# Patient Record
Sex: Male | Born: 1940 | Race: White | Hispanic: No | Marital: Married | Smoking: Current every day smoker
Health system: Southern US, Community
[De-identification: ages and names within clinical notes are randomized; demographics above are authoritative.]

## PROBLEM LIST (undated history)

## (undated) DIAGNOSIS — I10 Essential (primary) hypertension: Secondary | ICD-10-CM

## (undated) DIAGNOSIS — K279 Peptic ulcer, site unspecified, unspecified as acute or chronic, without hemorrhage or perforation: Secondary | ICD-10-CM

## (undated) DIAGNOSIS — E785 Hyperlipidemia, unspecified: Secondary | ICD-10-CM

## (undated) DIAGNOSIS — I4901 Ventricular fibrillation: Secondary | ICD-10-CM

## (undated) DIAGNOSIS — I420 Dilated cardiomyopathy: Secondary | ICD-10-CM

## (undated) DIAGNOSIS — I2510 Atherosclerotic heart disease of native coronary artery without angina pectoris: Secondary | ICD-10-CM

## (undated) DIAGNOSIS — I255 Ischemic cardiomyopathy: Secondary | ICD-10-CM

## (undated) DIAGNOSIS — I219 Acute myocardial infarction, unspecified: Secondary | ICD-10-CM

## (undated) DIAGNOSIS — H9190 Unspecified hearing loss, unspecified ear: Secondary | ICD-10-CM

## (undated) DIAGNOSIS — K219 Gastro-esophageal reflux disease without esophagitis: Secondary | ICD-10-CM

## (undated) DIAGNOSIS — K449 Diaphragmatic hernia without obstruction or gangrene: Secondary | ICD-10-CM

## (undated) DIAGNOSIS — Z95810 Presence of automatic (implantable) cardiac defibrillator: Secondary | ICD-10-CM

## (undated) DIAGNOSIS — M1990 Unspecified osteoarthritis, unspecified site: Secondary | ICD-10-CM

## (undated) DIAGNOSIS — I739 Peripheral vascular disease, unspecified: Secondary | ICD-10-CM

## (undated) DIAGNOSIS — I480 Paroxysmal atrial fibrillation: Secondary | ICD-10-CM

## (undated) DIAGNOSIS — H269 Unspecified cataract: Secondary | ICD-10-CM

## (undated) DIAGNOSIS — J449 Chronic obstructive pulmonary disease, unspecified: Secondary | ICD-10-CM

## (undated) HISTORY — PX: CORONARY ANGIOPLASTY: SHX604

## (undated) HISTORY — DX: Acute myocardial infarction, unspecified: I21.9

## (undated) HISTORY — DX: Peripheral vascular disease, unspecified: I73.9

## (undated) HISTORY — PX: APPENDECTOMY: SHX54

## (undated) HISTORY — DX: Hyperlipidemia, unspecified: E78.5

## (undated) HISTORY — PX: PR VEIN BYPASS GRAFT,AORTO-FEM-POP: 35551

## (undated) HISTORY — DX: Peptic ulcer, site unspecified, unspecified as acute or chronic, without hemorrhage or perforation: K27.9

## (undated) HISTORY — DX: Unspecified osteoarthritis, unspecified site: M19.90

## (undated) HISTORY — DX: Chronic obstructive pulmonary disease, unspecified: J44.9

## (undated) HISTORY — DX: Essential (primary) hypertension: I10

## (undated) HISTORY — DX: Atherosclerotic heart disease of native coronary artery without angina pectoris: I25.10

## (undated) HISTORY — DX: Ventricular fibrillation: I49.01

## (undated) HISTORY — DX: Ischemic cardiomyopathy: I25.5

## (undated) HISTORY — DX: Paroxysmal atrial fibrillation: I48.0

## (undated) HISTORY — DX: Gastro-esophageal reflux disease without esophagitis: K21.9

## (undated) HISTORY — DX: Dilated cardiomyopathy: I42.0

## (undated) HISTORY — PX: COLONOSCOPY: SHX174

## (undated) HISTORY — DX: Diaphragmatic hernia without obstruction or gangrene: K44.9

---

## 1993-11-10 DIAGNOSIS — I219 Acute myocardial infarction, unspecified: Secondary | ICD-10-CM

## 1993-11-10 HISTORY — DX: Acute myocardial infarction, unspecified: I21.9

## 1993-11-10 HISTORY — PX: CORONARY ARTERY BYPASS GRAFT: SHX141

## 2004-05-08 ENCOUNTER — Ambulatory Visit (HOSPITAL_COMMUNITY): Admission: RE | Admit: 2004-05-08 | Discharge: 2004-05-08 | Payer: Self-pay | Admitting: Vascular Surgery

## 2004-05-17 ENCOUNTER — Inpatient Hospital Stay (HOSPITAL_COMMUNITY): Admission: RE | Admit: 2004-05-17 | Discharge: 2004-05-18 | Payer: Self-pay | Admitting: Vascular Surgery

## 2009-05-09 ENCOUNTER — Ambulatory Visit: Payer: Self-pay | Admitting: Vascular Surgery

## 2009-05-15 ENCOUNTER — Inpatient Hospital Stay (HOSPITAL_COMMUNITY): Admission: RE | Admit: 2009-05-15 | Discharge: 2009-05-16 | Payer: Self-pay | Admitting: Vascular Surgery

## 2009-05-15 ENCOUNTER — Ambulatory Visit: Payer: Self-pay | Admitting: Vascular Surgery

## 2009-06-01 ENCOUNTER — Ambulatory Visit: Payer: Self-pay | Admitting: Vascular Surgery

## 2009-12-07 ENCOUNTER — Ambulatory Visit: Payer: Self-pay | Admitting: Vascular Surgery

## 2010-06-21 ENCOUNTER — Ambulatory Visit: Payer: Self-pay | Admitting: Vascular Surgery

## 2010-12-02 ENCOUNTER — Encounter: Payer: Self-pay | Admitting: Vascular Surgery

## 2011-02-16 LAB — BASIC METABOLIC PANEL
BUN: 4 mg/dL — ABNORMAL LOW (ref 6–23)
Chloride: 99 mEq/L (ref 96–112)
Creatinine, Ser: 0.9 mg/dL (ref 0.4–1.5)
GFR calc Af Amer: >60 mL/min (ref >60)
Potassium: 2.9 mEq/L — ABNORMAL LOW (ref 3.5–5.1)

## 2011-02-16 LAB — CBC
Hemoglobin: 13.1 g/dL (ref 13.0–17.0)
Platelets: 180 10*3/uL (ref 150–400)
RDW: 13.6 % (ref 11.5–15.5)
WBC: 15.7 10*3/uL — ABNORMAL HIGH (ref 4.0–10.5)

## 2011-02-16 LAB — WOUND CULTURE

## 2011-02-16 LAB — TYPE AND SCREEN: ABO/RH(D): O NEG

## 2011-02-17 LAB — COMPREHENSIVE METABOLIC PANEL
ALT: 16 U/L (ref 0–53)
AST: 20 U/L (ref 0–37)
Albumin: 4 g/dL (ref 3.5–5.2)
Alkaline Phosphatase: 104 U/L (ref 39–117)
CO2: 29 mEq/L (ref 19–32)
GFR calc Af Amer: >60 mL/min (ref >60)
GFR calc non Af Amer: >60 mL/min (ref >60)
Glucose, Bld: 98 mg/dL (ref 70–99)
Sodium: 142 mEq/L (ref 135–145)

## 2011-02-17 LAB — CBC
HCT: 44 % (ref 39.0–52.0)
MCHC: 33.8 g/dL (ref 30.0–36.0)
WBC: 14.5 10*3/uL — ABNORMAL HIGH (ref 4.0–10.5)

## 2011-02-17 LAB — PROTIME-INR
INR: 1 (ref 0.00–1.49)
Prothrombin Time: 13.1 seconds (ref 11.6–15.2)

## 2011-03-25 NOTE — H&P (Signed)
HISTORY AND PHYSICAL EXAMINATION   May 09, 2009   Re:  Stephen Cohen, Stephen Cohen                 DOB:  09-26-1941   ADMISSION DIAGNOSIS:  Skin erosion from a prior right-to-left fem-fem  bypass.   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  He  is known to me from a prior right-to-left fem-fem bypass for left iliac  occlusion in 1995.  He did well and subsequently in 2005 presented with  a right femoral false aneurysm and occluded fem-fem graft.  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  He has done well in the subsequent 5 years.  He just recently  presented to his medical doctor with erythema and erosion near his right  groin over the graft.  I discussed this with his family physician and  they requested we see him for evaluation with a CAT scan to visualize  graft involvement.  He is relatively stable from a cardiac standpoint.  He has had subsequent myocardial infarctions.  He does have hypertension  and elevated cholesterol, COPD, acid reflux disease.   FAMILY HISTORY:  Significant for premature atherosclerotic disease in  his mother and father.   SOCIAL HISTORY:  He is married.  He is retired.  He does smoke 1-1/2  packs of cigarettes per day.  He does not drink alcohol.   REVIEW OF SYSTEMS:  His weight is reported at 175 pounds, he is 6 feet  tall.  He reports chest pain, shortness of breath with lying flat and  exertion, hiatal hernia reflux, pain in his legs with walking with  arthritic joint pain.   CURRENT MEDICATIONS:  1. Cipro 500 mg b.i.d.  2. Norvasc 10 mg 2 per day.  3. Isosorbide 120 mg 1 per day.  4. Lipitor 20 mg daily.  5. Furosemide 40 mg daily.  6. Nexium 40 mg daily.  7. Klor-Con 20 mEq twice daily.  8. Combivent 2 puffs 4 times per day.  9. Nitroglycerin sublingual comp as needed.  10.Probenecid 500  mg 1/2 twice per day.  11.Slo-Niacin 500 mg twice per day.   PHYSICAL EXAMINATION:  A well-developed well-nourished white male  appearing stated age of 32.  Blood pressure is 138/72, pulse 70,  respirations 18.  Chest:  Clear bilaterally.  Heart:  Regular rate and  rhythm.  Abdomen:  Benign with no evidence of tenderness.  He does have  easily palpable fem-fem bypass graft and graft pulses and palpable  posterior tibial pulses bilaterally.  He does have a punctate erosion,  no exposed graft, near the right femoral anastomosis.  I reviewed his  CAT scan with him.  This does show a patent bypass and no evidence of  fluid around the graft.  I explained that with the persistent drainage  from this that he obviously has local involvement of this area of graft.  He is quite thin and this is eroded to the surface.  I explained that  his options would be complete removal of the graft vs. Attempted removal  at a segment of the graft to preserve the remaining portion of the graft  anastomoses.  I have recommended this option.  He does understand that  he may have more involvement at the time of surgery and would require  complete replacement of  graft.  We will proceed with this on 07/06 at  [facility].  He understands that he does have significant  coronary disease but that this certainly is not an elective procedure  since he does have involvement of the graft.   Larina Earthly, M.D.  Electronically Signed   TFE/MEDQ  D:  05/09/2009  T:  05/10/2009  Job:  [id number]   cc:   Gabriel Cirri, Dr.  Hazle Nordmann

## 2011-03-25 NOTE — Assessment & Plan Note (Signed)
OFFICE VISIT   ORVIE, CARADINE  DOB:  09/11/1941                                       06/01/2009  EAVWU#:[id number]   The patient presents today for followup of replacement of his right half  of his right to left fem-fem bypass.  He had had initial graft placement  in 1995 and subsequent replacement of the right anastomosis due to false  aneurysm in 2005.  He presented with erosion of the graft at the skin  level.  The old right half of the graft was completely removed and he  had a rifampin soaked Dacron graft placed tying into the midline and  extending it over to the redo right femoral anastomosis.  He has done  well.  He had a single suture of nylon placed at the area of the eroded  graft and this still has slight serous drainage.  There is no erythema  or fluctuance.   He will continue his usual activities.  Since he lives in [place] he  will notify us should he develop any persistent drainage greater than  another 2 weeks and if so we will see him back in followup.   Larina Earthly, M.D.  Electronically Signed   TFE/MEDQ  D:  06/01/2009  T:  06/02/2009  Job:  [id number]

## 2011-03-25 NOTE — Op Note (Signed)
NAMEITZAE, MIRALLES NO.:  [id number]   MEDICAL RECORD NO.:  [id number]          PATIENT TYPE:  INP   LOCATION:  2039                         FACILITY:  [facility]   PHYSICIAN:  Larina Earthly, M.D.    DATE OF BIRTH:  [birth year]   DATE OF PROCEDURE:  05/15/2009  DATE OF DISCHARGE:                               OPERATIVE REPORT   PREOPERATIVE DIAGNOSIS:  Exposed graft of right to left femoral-femoris  bypass.   POSTOPERATIVE DIAGNOSIS:  Exposed graft of right to left femoral-femoris  bypass.   PROCEDURE:  Replacement of right to left femoral-femoral bypass with 10-  mm rifampicin-soaked Hemashield graft.   SURGEON:  Larina Earthly, MD   ASSISTANT:  Jerold Coombe, PA-C   ANESTHESIA:  General endotracheal.   COMPLICATIONS:  None.   DISPOSITION:  To recovery room, stable.   INDICATIONS FOR PROCEDURE:  The patient is a 70 year old gentleman who  initially underwent right to left fem-fem bypass for left iliac  occlusion initially in 1995.  In 2005, he had replacement of the right  femoral anastomosis due to false aneurysm at this level.  He had done  well since that time.  He presented to our office with several month  history of irritation and drainage from an area overlying the graft.   On physical exam, the graft did come very close to the skin and there  was a punctate pinpoint area with slight drainage.  There was no  surrounding erythema.  The patient had undergone CT angiogram, which did  not show any fluid around the graft and did show that the graft  approached the skin at this level.  It was recommended that he undergo  replacement of this portion of the graft, also explained that he may  require complete removal of the graft if there was gross infection.   PROCEDURE IN DETAIL:  The patient was taken to the room and placed in  supine position where the both groins were prepped and draped in usual  sterile fashion.  An incision was made over the  old femoral anastomosis  through the old scar and carried down to isolate the Hemashield graft to  common femoral anastomosis.  The graft itself was well incorporated and  there was no evidence of pus.  The common, superficial, femoral, and  fundus femoris arteries were occluded.  The graft then was mobilized  towards this area of involvement and there was fluid at this area with  no incorporation, and there was scant amount of fluid and no pus  present.  This was sent for Gram stain, C and S, Gram stain showed no  organisms.  For this reason, a separate incision was made at the midline  over the pulse of the right to left fem-fem graft and the graft at this  area was exposed and again was incorporated.  Incision was made to  replace this segment of the graft from the midline including the right  femoral anastomosis with a rifampin soaked Hemashield graft.  This was a  10-mm Hemashield graft brought onto  the field and was prepped in the  usual fashion with rifampin soak.  The graft was occluded at the midline  and also the common superficial femoral-femoris were occluded.  The  graft was transected at the midline and the graft was completely removed  from the femoral anastomosis.  A tunnel was created around the area of  the old exposed graft, more cephalad over the abdominal wall and the  graft brought to the tunnel.  The graft was sewn end-to-end to the old  graft.  A running 5-0 Prolene suture then was brought to approximation  with the femoral artery.  The graft slightly spatulated, sewn end-to-  side to the femoral artery with a running 5-0 Prolene suture.  Prior to  completion of anastomosis, usual flushing maneuvers were undertaken.  The patient had been given 1000 intravenous heparin for occlusion of the  graft and arteries.  There was technically good anastomosis and the  heparin with 50 mg of protamine.  Wounds were irrigated with saline.  Hemostasis cautery.  The groin and  midline incision were closed with  several layers of 2-0 Vicryl and subcutaneous tissue.  Skin was closed  with skin clips.  The area of the prior involvement of irritation had  been ellipsed.  There was no gross infection.  This was closed with a  single 3-0 nylon stitch.  Sterile dressing was applied.  The patient was  taken to the recovery room in stable condition.      Larina Earthly, M.D.  Electronically Signed     TFE/MEDQ  D:  05/15/2009  T:  05/15/2009  Job:  [id number]

## 2011-03-25 NOTE — Procedures (Signed)
BYPASS GRAFT EVALUATION   INDICATION:  Followup of right to left fem-fem bypass graft.   HISTORY:  Diabetes:  No.  Cardiac:  MI in 1995.  Hypertension:  Yes.  Smoking:  Unknown.  Previous Surgery:  Right to left fem-fem bypass graft July of 2010.   SINGLE LEVEL ARTERIAL EXAM                               RIGHT              LEFT  Brachial:                    135                139  Anterior tibial:             102                128  Posterior tibial:            133                136  Peroneal:  Ankle/brachial index:        0.96               0.98   PREVIOUS ABI:  Date:  05/08/2004  RIGHT:  0.98  LEFT:  0.98   LOWER EXTREMITY BYPASS GRAFT DUPLEX EXAM:   DUPLEX:  Biphasic Doppler waveforms throughout bypass graft and native  arteries.   IMPRESSION:  1. Patent right to left femoral-femoral with elevated velocity of 231      cm/s at the proximal anastomosis.  2. Ankle brachial indices are within normal limits bilaterally.   ___________________________________________  Larina Earthly, M.D.   CJ/MEDQ  D:  12/07/2009  T:  12/07/2009  Job:  [id number]

## 2011-03-25 NOTE — Discharge Summary (Signed)
NAMEALIOU, MEALEY NO.:  [id number]   MEDICAL RECORD NO.:  [id number]          PATIENT TYPE:  INP   LOCATION:  2039                         FACILITY:  [facility]   PHYSICIAN:  Larina Earthly, M.D.    DATE OF BIRTH:  [birth year]   DATE OF ADMISSION:  05/15/2009  DATE OF DISCHARGE:  05/16/2009                               DISCHARGE SUMMARY   Patient of Dr. Arbie Cookey   FINAL/DISCHARGE DIAGNOSES:  1. Skin erosion of right groin of right-to-left femoral-femoral bypass      graft.  2. Peripheral vascular disease.  3. Hypertension.  4. Dyslipidemia.   PROCEDURE PERFORMED:  Revision of right-to-left femoral-femoral bypass  graft with replacement of right left internal mammary artery graft with  10-mm Hemashield Dacron graft by Dr. early on May 15, 2009.   COMPLICATIONS:  None.   CONDITION ON DISCHARGE:  Stable and improving.   DISCHARGE MEDICATIONS:  1. Ocuvite one p.o. q.a.m. and p.m.  2. Norvasc 10 mg p.o. b.i.d.  3. Imdur 20 mg p.o. t.i.d.  4. Lipitor 20 mg p.o. q.a.m.  5. Nexium 40 mg p.o. q.a.m.  6. Lasix 40 mg p.o. q.a.m.  7. Niaspan 500 mg p.o. b.i.d.  8. Combivent inhaler 2 puffs p.r.n.  9. Klor-Con 20 mEq p.o. b.i.d.  10.Probenecid 500 mg 1/2 tablet p.o. b.i.d.  11.Bioflavonoids Plus p.o. b.i.d.   He is given prescriptions for Keflex 500 mg p.o. t.i.d. for 2 weeks and  Percocet 5/325 one p.o. q.4 h. p.r.n. pain total #30 were given.   DISPOSITION:  He is being discharged home in stable condition with his  wounds healing well.  He is given careful instructions regarding care of  his wounds and activity level.  He is to follow up with Dr. early in 2  weeks, the office will arrange the visit.   BRIEF IDENTIFYING STATEMENT:  For complete details, please refer the  typed history and physical.  Briefly, this 70 year old gentleman  underwent right-to-left femoral-femoral bypass grafting earlier.  He has  developed some erosion of the skin over his right  groin.  Dr. Arbie Cookey  recommended exploration.  He was informed of the risks and benefits of  the procedure and after careful consideration, he elected to proceed  with surgery.   HOSPITAL COURSE:  Preoperative workup was completed as an outpatient.  He was brought in through same day surgery and underwent the  aforementioned revision.  For complete details, please refer the typed  operative report.  The procedure was without complications.  He was  returned to the Post Anesthesia Care Unit and extubated.  Following  stabilization, he was transferred to a bed on the surgical convalescent  floor.  He was observed overnight, the following morning, he was found  to be in stable condition.  He was desirous of discharge and he was  subsequently discharged home.      Wilmon Arms, PA      Larina Earthly, M.D.  Electronically Signed    KEL/MEDQ  D:  05/16/2009  T:  05/16/2009  Job:  [id number]

## 2011-03-28 NOTE — Op Note (Signed)
NAME:  Stephen Cohen, Stephen Cohen                           ACCOUNT NO.:  [id number]   MEDICAL RECORD NO.:  [id number]                   PATIENT TYPE:  INP   LOCATION:  3307                                 FACILITY:  [facility]   PHYSICIAN:  Larina Earthly, M.D.                 DATE OF BIRTH:  1941-05-02   DATE OF PROCEDURE:  DATE OF DISCHARGE:  05/18/2004                                 OPERATIVE REPORT   PREOPERATIVE DIAGNOSIS:  Right femoral false aneurysm.   POSTOPERATIVE DIAGNOSIS:  Right femoral false aneurysm with occluded right-  to-left fem-fem bypass.   PROCEDURE:  1. Repair of fight femoral false aneurysm.  2. Thrombectomy of right-to-left fem-fem bypass.   SURGEON:  Larina Earthly, M.D.   ASSISTANT:  Coral Ceo, P.A.C.   ANESTHESIA:  General endotracheal.   COMPLICATIONS:  None; to recovery room stable.   DESCRIPTION OF PROCEDURE:  The patient was taken to the operating room and  placed __________ where the right groin was prepped and draped in the usual  fashion.  Incision was made over the femoral false aneurysm and carried down  to isolate the large false aneurysm and also to isolate the right common  femoral artery, proximal and distal to the old fem-fem bypass anastomosis.   The patient was given 7000 units of intravenous heparin, and after adequate  circulation time the common femoral artery was occluded proximally and  distally.  The false aneurysm was entered and there was no evidence of  purulence.  There was a culture sent for C&S.  The fem-fem bypass had  thrombosed which was unexpected.  The patient had had an arteriogram the  week prior regarding patency.  It was presumed that this occluded with  pressure after the arteriogram.   The fem-fem bypass was thrombectomized with a 5 Fogarty and good  backbleeding was encountered.  This was flushed with heparinized saline and  reoccluded.  An __________ graft of 10 mm of Hemashield was brought out onto  the field.  The  old anastomosis was excised.  The graft was spatulated and  sewn end-to-side to the common femoral artery with a running 5-0 Prolene  suture.  Clamps were removed from the femoral artery and were placed on the  graft.  The graft was cut to appropriate length and was then sewn end-to-end  to the old graft with a running 5-0 Prolene suture.  Clamps were removed and  an excellent flow was noted with pulse being palpable in the dorsalis pedis  and the right and left foot.  The false aneurysm sac was excised.   The wound was irrigated with saline.  Hemostasis with electrocautery.  Wounds were closed with 2-0 Vicryl in several layers in the subcutaneous  tissue and the skin was closed with 3-0 subcuticular Vicryl stitch.  Benzoin  and Steri-Strips were applied.  The patient was taken to the recovery  room  in stable condition.                                               Larina Earthly, M.D.    TFE/MEDQ  D:  05/17/2004  T:  05/18/2004  Job:  [id number]

## 2011-03-28 NOTE — Op Note (Signed)
NAME:  Stephen Cohen, Stephen Cohen                           ACCOUNT NO.:  [id number]   MEDICAL RECORD NO.:  [id number]                   PATIENT TYPE:  OIB   LOCATION:  2899                                 FACILITY:  [facility]   PHYSICIAN:  Larina Earthly, M.D.                 DATE OF BIRTH:  25-Sep-1941   DATE OF PROCEDURE:  05/08/2004  DATE OF DISCHARGE:                                 OPERATIVE REPORT   PREOPERATIVE DIAGNOSIS:  Right femoral false aneurysm.   POSTOPERATIVE DIAGNOSIS:  Right femoral false aneurysm.   PROCEDURE:  Aortogram, bilateral lower extremities run-off.   SURGEON:  Larina Earthly, M.D.   ANESTHESIA:  1% lidocaine local.   COMPLICATIONS:  None.   DISPOSITION:  To recovery room, stable.   INDICATIONS FOR PROCEDURE:  The patient is status post right to left  femorofemoral bypass graft, approximately 10 years prior. He presents with  enlarged, false aneurysm in his right femoral anastomosis. The bypass graft  itself is patent.  The patient also reports claudication in both calves when  walking uphill.   DESCRIPTION OF PROCEDURE:  The patient was taken to the cardiac  catheterization, placed in the supine position where the area of both groins  were prepped and draped in the usual sterile fashion.  Using local  anesthesia for single wall puncture, the right common femoral artery was  entered and the guide wire was passed up to the level of the suprarenal  aorta.   AP projection was undertaken.  This revealed widely patent single renal  arteries bilaterally.  There was irregularity of the infra-renal aorta but  no evidence of flow dynamic stenosis. There was mild irregularity at the  take-off of the right common femoral artery. There was complete occlusion of  the left external iliac artery.  The pigtail catheter was then withdrawn  down into the infrarenal aorta and aortogram and run-off was obtained. This  revealed a patent right to left femorofemoral bypass with a  moderate sized  false aneurysm in the right groin and a small, false aneurysm in the left  groin.  The superficial femoral arteries were widely patent bilaterally with  no evidence of flow limiting stenosis. The popliteal arteries were also  widely patent bilaterally and there was normal 3 vessel run-off bilaterally.   Additional oblique views were obtained of the right groin and again, no  evidence of flow limiting stenosis was noted at the anastomosis.  The  patient tolerated the procedure well without any immediate complications,  was transferred to the holding area in stable condition.   FINDINGS:  1. Patent right to left femorofemoral bypass with false aneurysm of the     right femoral anastomosis.  2. No evidence of significant iliac occlusive disease on the right with     normal pull-back pressures.  3. Normal patent superficial femoral artery, popliteal and 3 vessel run-offs  bilaterally.                                               Larina Earthly, M.D.    TFE/MEDQ  D:  05/08/2004  T:  05/08/2004  Job:  [id number]

## 2011-03-28 NOTE — H&P (Signed)
NAME:  Stephen Cohen                           ACCOUNT NO.:  [id number]   MEDICAL RECORD NO.:  [id number]                   PATIENT TYPE:  INP   LOCATION:  NA                                   FACILITY:  [facility]   PHYSICIAN:  Larina Earthly, M.D.                 DATE OF BIRTH:  [birth year]   DATE OF ADMISSION:  05/17/2004  DATE OF DISCHARGE:                                HISTORY & PHYSICAL   CHIEF COMPLAINT:  Knot in my right groin.   HISTORY OF PRESENT ILLNESS:  This is a 70 year old male, well known to [facility]  and associates, having previous right-to-left femoral-femoral bypass  grafting in 1995 by Dr. Kristen Loader. Early.  He additionally has undergone  interventional procedures through the radiology department including a right  iliac, percutaneous transluminal angioplasty, and a right superficial  femoral artery angioplasty.  Recently, the patient has had increasing  symptoms of bilateral claudication.  He does not have rest pain.  He does  not have any tissue loss.  Approximately four months ago, he noted that he  was developing some increased prominence in the region of the right groin  that has expanded significantly.  He was seen in the office by Dr. Arbie Cookey and  was felt to have findings consistent with a femoral pseudoaneurysm.   An arteriogram with bilateral lower extremities was scheduled and performed  on May 08, 2004, by Dr. Arbie Cookey.  Findings included:  1. Patent right-to-left femoral-femoral bypass with false aneurysm of the     right femoral anastomosis.  2. No evidence of significant iliac occlusive disease on the right with     normal pullback pressures.  3. Normal patent superficial femoral artery popliteal and three-vessel     bilateral runoff.   The patient has had no significant constitutional symptoms and denies fever  or chills. The mass is tender to palpation.  He has had no discharge or  erythema.  He is agreeable to proceed with repair of the right femoral  pseudoaneurysm, and will be admitted this hospitalization for the procedure.   PAST MEDICAL HISTORY:  1. Coronary artery disease status post myocardial infarction.  2. Peripheral vascular occlusive disease.  3. Peptic ulcer disease with hiatal hernia and gastroesophageal reflux.  4. Hypertension.  5. Hyperlipidemia.  6. Gout.   PAST SURGICAL HISTORY:  1. Coronary artery bypass graft x7 in 1995 by Dr. Tyrone Sage.  2. Appendectomy.  3. Right to left fem-fem bypass in 1995 by Dr. Arbie Cookey.   CURRENT MEDICATIONS:  1. Norvasc 10 mg b.i.d.  2. Imdur 120 mg daily.  3. Lipitor 10 mg daily.  4. Nexium 40 mg daily.  5. Lasix 40 mg daily.  6. Allopurinol 100 mg daily.  7. Aspirin 81 mg daily.  8. Slow niacin 500 mg daily.  9. Combivent two puffs b.i.d. p.r.n.  10.      Sublingual nitroglycerin  spray p.r.n.   ALLERGIES:  Novocaine.   REVIEW OF SYMPTOMS:  See history of present illness for significant  positives.  Also positive for the following:  1. Orthopnea.  2. Tinnitus.  3. Easy bruising.  4. Multi joint arthritis.  5. Nocturia.  6. Decreased auditory acuity.   FAMILY HISTORY:  Remarkable for heart disease in his mother.  He also has  multiple family members with diabetes mellitus.   SOCIAL HISTORY:  He is married, alcohol use none.  Tobacco use approximately  110 pack a year history.  He began smoking at age 63.  He continues  approximately two packs per day.   PHYSICAL EXAMINATION:  VITAL SIGNS:  Blood pressure 120/90, pulse 82.  Respirations 16.  GENERAL:  This is a 70 year old white male, in no acute distress.  Alert and  oriented x3.  HEENT:  Examination, normocephalic, atraumatic.  Pupils equal, round and  reactive to light.  Extraocular movements intact.  No obvious cataracts.  Pharynx is clear.  No exudates or erythema.  He does have upper dentures.  He is edentulous in the lower jaw.  No obvious lesions.  NECK:  Examination supple.  No jugular venous distension.  No  carotid  bruits.  No lymphadenopathy.  CHEST:  Examination symmetrical on inspiration.  No wheezes, rhonchi or  rales.  CARDIAC:  Regular rate and rhythm with occasional ectopic, extra systole.  No murmurs, gallops or rubs.  ABDOMEN EXAMINATION:  Soft, nontender, normoactive bowel sounds.  There is a  large mass in the right groin consistent with the aforementioned diagnosis  of pseudoaneurysm.  EXTREMITIES:  No edema.  Peripheral pulses are equal and intact bilaterally.  NEUROLOGIC:  Examination is grossly nonfocal.   ASSESSMENT:  Large right pseudoaneurysm for elective repair, May 17, 2004,  Dr. Arbie Cookey.      Rowe Clack, P.A.-C.                    Larina Earthly, M.D.    Sherryll Burger  D:  05/15/2004  T:  05/15/2004  Job:  [id number]   cc:   Larina Earthly, M.D.  [address]  [place]  [place] [address]   Doreen Beam  [address]  [place]  [place] [address]  Fax: [phone]

## 2012-07-22 ENCOUNTER — Encounter: Payer: Self-pay | Admitting: Vascular Surgery

## 2012-08-16 ENCOUNTER — Other Ambulatory Visit: Payer: Self-pay | Admitting: *Deleted

## 2012-08-16 ENCOUNTER — Encounter: Payer: Self-pay | Admitting: Vascular Surgery

## 2012-08-16 DIAGNOSIS — Z48812 Encounter for surgical aftercare following surgery on the circulatory system: Secondary | ICD-10-CM

## 2012-08-16 DIAGNOSIS — I70219 Atherosclerosis of native arteries of extremities with intermittent claudication, unspecified extremity: Secondary | ICD-10-CM

## 2012-08-19 ENCOUNTER — Encounter: Payer: Self-pay | Admitting: Neurosurgery

## 2012-08-20 ENCOUNTER — Ambulatory Visit (INDEPENDENT_AMBULATORY_CARE_PROVIDER_SITE_OTHER): Payer: Medicare Other | Admitting: Neurosurgery

## 2012-08-20 ENCOUNTER — Ambulatory Visit (INDEPENDENT_AMBULATORY_CARE_PROVIDER_SITE_OTHER): Payer: Medicare Other | Admitting: Vascular Surgery

## 2012-08-20 ENCOUNTER — Encounter: Payer: Self-pay | Admitting: Neurosurgery

## 2012-08-20 VITALS — BP 136/68 | HR 58 | Resp 18 | Ht 71.0 in | Wt 162.0 lb

## 2012-08-20 DIAGNOSIS — R1032 Left lower quadrant pain: Secondary | ICD-10-CM

## 2012-08-20 DIAGNOSIS — I724 Aneurysm of artery of lower extremity: Secondary | ICD-10-CM

## 2012-08-20 DIAGNOSIS — I739 Peripheral vascular disease, unspecified: Secondary | ICD-10-CM

## 2012-08-20 DIAGNOSIS — Z48812 Encounter for surgical aftercare following surgery on the circulatory system: Secondary | ICD-10-CM

## 2012-08-20 DIAGNOSIS — I70219 Atherosclerosis of native arteries of extremities with intermittent claudication, unspecified extremity: Secondary | ICD-10-CM

## 2012-08-20 MED ORDER — CEPHALEXIN 500 MG PO CAPS: 500.0000 mg | ORAL_CAPSULE | Freq: Four times a day (QID) | ORAL | Status: DC

## 2012-08-20 NOTE — Progress Notes (Signed)
Ankle brachial Indices performed @ [facility] 08/20/2012

## 2012-08-20 NOTE — Progress Notes (Signed)
[facility] [place] PAD/PVD Office Note  CC: PVD surveillance Referring Physician: Early  History of Present Illness: 71 year old male patient of Dr. early status post right to left fem-fem bypass graft in July of 2010 with other intervention previously. The patient denies any signs or symptoms of claudication, rest pain or open ulcerations of lower extremities. After I examined the patient and was about to age of around the patient showed me a large mass in the lower left abdomen that trialed distal to the umbilicus. The patient states it gets sore at times, some worse than others, and has been transient since his last procedure. There has been no redness or drainage or fever.  Past Medical History  Diagnosis Date  . Hypertension   . Hyperlipidemia   . Myocardial infarction 1995  . COPD (chronic obstructive pulmonary disease)   . GERD (gastroesophageal reflux disease)   . Hiatal hernia   . Peripheral vascular disease   . Arthritis   . Peptic ulcer disease     ROS: [x]  Positive   [ ]  Denies    General: [ ]  Weight loss, [ ]  Fever, [ ]  chills Neurologic: [ ]  Dizziness, [ ]  Blackouts, [ ]  Seizure [ ]  Stroke, [ ]  "Mini stroke", [ ]  Slurred speech, [ ]  Temporary blindness; [ ]  weakness in arms or legs, [ ]  Hoarseness Cardiac: [ ]  Chest pain/pressure, [ ]  Shortness of breath at rest [ ]  Shortness of breath with exertion, [ ]  Atrial fibrillation or irregular heartbeat Vascular: [ ]  Pain in legs with walking, [ ]  Pain in legs at rest, [ ]  Pain in legs at night,  [ ]  Non-healing ulcer, [ ]  Blood clot in vein/DVT,   Pulmonary: [ ]  Home oxygen, [ ]  Productive cough, [ ]  Coughing up blood, [ ]  Asthma,  [ ]  Wheezing Musculoskeletal:  [ ]  Arthritis, [ ]  Low back pain, [ ]  Joint pain Hematologic: [ ]  Easy Bruising, [ ]  Anemia; [ ]  Hepatitis Gastrointestinal: [ ]  Blood in stool, [ ]  Gastroesophageal Reflux/heartburn, [ ]  Trouble swallowing Urinary: [ ]  chronic Kidney disease,  [ ]  on HD - [ ]  MWF or [ ]  TTHS, [ ]  Burning with urination, [ ]  Difficulty urinating Skin: [ ]  Rashes, [ ]  Wounds Psychological: [ ]  Anxiety, [ ]  Depression   Social History History  Substance Use Topics  . Smoking status: Current Every Day Smoker -- 1.0 packs/day  . Smokeless tobacco: Not on file  . Alcohol Use: No    Family History No family history on file.  No Known Allergies  Current Outpatient Prescriptions  Medication Sig Dispense Refill  . albuterol-ipratropium (COMBIVENT) 18-103 MCG/ACT inhaler Inhale into the lungs every 6 (six) hours as needed.      Marland Kitchen amLODipine (NORVASC) 10 MG tablet Take 10 mg by mouth daily.      Marland Kitchen atorvastatin (LIPITOR) 20 MG tablet Take 20 mg by mouth daily.      Marland Kitchen esomeprazole (NEXIUM) 40 MG capsule Take 40 mg by mouth daily before breakfast.      . furosemide (LASIX) 40 MG tablet Take 40 mg by mouth daily.      . isosorbide mononitrate (IMDUR) 120 MG 24 hr tablet Take 120 mg by mouth daily.      . nitroGLYCERIN (NITROLINGUAL) 0.4 MG/SPRAY spray Place 1 spray under the tongue every 5 (five) minutes as needed.      . potassium chloride SA (K-DUR,KLOR-CON) 20 MEQ tablet Take 20 mEq by  mouth 2 (two) times daily.      . probenecid (BENEMID) 500 MG tablet Take 500 mg by mouth 2 (two) times daily.      . cephALEXin (KEFLEX) 500 MG capsule Take 1 capsule (500 mg total) by mouth 4 (four) times daily.  40 capsule  0  . niacin (SLO-NIACIN) 500 MG tablet Take 500 mg by mouth 2 (two) times daily with a meal.        Physical Examination  Filed Vitals:   08/20/12 1344  BP: 136/68  Pulse: 58  Resp: 18    Body mass index is 22.59 kg/(m^2).  General:  WDWN in NAD Gait: Normal HEENT: WNL Eyes: Pupils equal Pulmonary: normal non-labored breathing , without Rales, rhonchi,  wheezing Cardiac: RRR, without  Murmurs, rubs or gallops; No carotid bruits Abdomen: soft, NT, no masses Skin: no rashes, ulcers noted Vascular Exam/Pulses: Palpable lower  extremity pulses bilaterally, 2+ radial pulses bilaterally, Dr. Imogene Burn did examine the patient as well to questioned pseudoaneurysm versus a fluid collection in this area.  Extremities without ischemic changes, no Gangrene , no cellulitis; no open wounds;  Musculoskeletal: no muscle wasting or atrophy  Neurologic: A&O X 3; Appropriate Affect ; SENSATION: normal; MOTOR FUNCTION:  moving all extremities equally. Speech is fluent/normal  Non-Invasive Vascular Imaging: ABIs today are 0.99 bilaterally and biphasic on the right, triphasic on the left the patient has a patent bypass.  ASSESSMENT/PLAN: Asymptomatic patient with no claudication however he does have pain across the bypass area. Dr. Imogene Burn ordered a duplex which shows tissue irregularity surrounding the graft from mid to distal segment and may suggest a seroma versus infection. I will place the patient on Keflex 500 mg 4 times a day for 10 days and have him return to see Dr. Arbie Cookey in 2 weeks. The patient's questions were encouraged and answered, he is in agreement with this plan.  Lauree Chandler ANP   Clinic M.D.: Imogene Burn

## 2012-08-23 NOTE — Addendum Note (Signed)
Addended by: Sharee Pimple on: 08/23/2012 11:13 AM   Modules accepted: Orders

## 2012-08-30 ENCOUNTER — Encounter: Payer: Self-pay | Admitting: Vascular Surgery

## 2012-08-31 ENCOUNTER — Ambulatory Visit (INDEPENDENT_AMBULATORY_CARE_PROVIDER_SITE_OTHER): Payer: Medicare Other | Admitting: Vascular Surgery

## 2012-08-31 ENCOUNTER — Encounter: Payer: Self-pay | Admitting: Vascular Surgery

## 2012-08-31 VITALS — BP 140/62 | HR 61 | Temp 97.7°F | Ht 71.0 in | Wt 161.0 lb

## 2012-08-31 DIAGNOSIS — I70219 Atherosclerosis of native arteries of extremities with intermittent claudication, unspecified extremity: Secondary | ICD-10-CM

## 2012-08-31 NOTE — Progress Notes (Signed)
Patient presents today for followup of his fem-fem bypass. 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 He did well for 10 years and then presented with a right femoral false aneurysm in 2005 and had repair of this. In 2010 he presented with exposed graft in his right to left fem-fem bypass. This was removed and replaced with a right and soaked Hemashield graft in July of 2010. He presented to our office on 08/20/2012 with some erythema and tenderness over the upper portion in above the level of the pubis of his graft. He is been on Keflex 500 mg 4 times a day since that time. Fortunately has had complete resolution of the erythema and tenderness. He has a 2+ fem-fem graft pulse and has a 2+ popliteal pulses bilaterally. I discussed the concern regarding potential infection of prosthetic graft with the patient. I would recommend additional 20 days for a total of 4 weeks of treatment with the concern regarding  Inflammation. He was written for additional 3 weeks of Keflex. He will notify should he develop any further erythema. Otherwise we'll see [place] on an as-needed basis

## 2012-11-11 ENCOUNTER — Inpatient Hospital Stay (HOSPITAL_COMMUNITY)
Admission: AD | Admit: 2012-11-11 | Discharge: 2012-11-12 | DRG: 247 | Disposition: A | Discharge status: Home / Self Care | Payer: Medicare Other | Source: Other Acute Inpatient Hospital | Attending: Cardiology | Admitting: Cardiology

## 2012-11-11 ENCOUNTER — Encounter (HOSPITAL_COMMUNITY): Payer: Self-pay | Admitting: General Practice

## 2012-11-11 ENCOUNTER — Encounter (HOSPITAL_COMMUNITY)
Admission: AD | Disposition: A | Discharge status: Home / Self Care | Payer: Self-pay | Source: Other Acute Inpatient Hospital | Attending: Cardiology

## 2012-11-11 ENCOUNTER — Ambulatory Visit (HOSPITAL_COMMUNITY): Admit: 2012-11-11 | Payer: Self-pay | Admitting: Cardiology

## 2012-11-11 DIAGNOSIS — I739 Peripheral vascular disease, unspecified: Secondary | ICD-10-CM | POA: Diagnosis present

## 2012-11-11 DIAGNOSIS — E785 Hyperlipidemia, unspecified: Secondary | ICD-10-CM | POA: Diagnosis present

## 2012-11-11 DIAGNOSIS — I200 Unstable angina: Secondary | ICD-10-CM | POA: Diagnosis present

## 2012-11-11 DIAGNOSIS — Z9861 Coronary angioplasty status: Secondary | ICD-10-CM

## 2012-11-11 DIAGNOSIS — F17200 Nicotine dependence, unspecified, uncomplicated: Secondary | ICD-10-CM | POA: Diagnosis present

## 2012-11-11 DIAGNOSIS — E876 Hypokalemia: Secondary | ICD-10-CM | POA: Diagnosis present

## 2012-11-11 DIAGNOSIS — K449 Diaphragmatic hernia without obstruction or gangrene: Secondary | ICD-10-CM | POA: Diagnosis present

## 2012-11-11 DIAGNOSIS — I25810 Atherosclerosis of coronary artery bypass graft(s) without angina pectoris: Secondary | ICD-10-CM | POA: Diagnosis present

## 2012-11-11 DIAGNOSIS — J449 Chronic obstructive pulmonary disease, unspecified: Secondary | ICD-10-CM | POA: Diagnosis present

## 2012-11-11 DIAGNOSIS — M129 Arthropathy, unspecified: Secondary | ICD-10-CM | POA: Diagnosis present

## 2012-11-11 DIAGNOSIS — K219 Gastro-esophageal reflux disease without esophagitis: Secondary | ICD-10-CM | POA: Diagnosis present

## 2012-11-11 DIAGNOSIS — Z79899 Other long term (current) drug therapy: Secondary | ICD-10-CM

## 2012-11-11 DIAGNOSIS — I10 Essential (primary) hypertension: Secondary | ICD-10-CM | POA: Diagnosis present

## 2012-11-11 DIAGNOSIS — I2510 Atherosclerotic heart disease of native coronary artery without angina pectoris: Principal | ICD-10-CM | POA: Diagnosis present

## 2012-11-11 DIAGNOSIS — I5042 Chronic combined systolic (congestive) and diastolic (congestive) heart failure: Secondary | ICD-10-CM | POA: Diagnosis present

## 2012-11-11 DIAGNOSIS — J4489 Other specified chronic obstructive pulmonary disease: Secondary | ICD-10-CM | POA: Diagnosis present

## 2012-11-11 DIAGNOSIS — I252 Old myocardial infarction: Secondary | ICD-10-CM

## 2012-11-11 HISTORY — PX: PERCUTANEOUS CORONARY STENT INTERVENTION (PCI-S): SHX5485

## 2012-11-11 HISTORY — PX: LEFT HEART CATHETERIZATION WITH CORONARY ANGIOGRAM: SHX5451

## 2012-11-11 LAB — CBC WITH DIFFERENTIAL/PLATELET
Basophils Absolute: 0.1 10*3/uL (ref 0.0–0.1)
Basophils Relative: 0 % (ref 0–1)
Eosinophils Absolute: 0.2 10*3/uL (ref 0.0–0.7)
Lymphs Abs: 4.2 10*3/uL — ABNORMAL HIGH (ref 0.7–4.0)
MCH: 28.5 pg (ref 26.0–34.0)
MCHC: 33.6 g/dL (ref 30.0–36.0)
Neutrophils Relative %: 54 % (ref 43–77)
Platelets: 206 10*3/uL (ref 150–400)
RBC: 5.02 MIL/uL (ref 4.22–5.81)
RDW: 13.6 % (ref 11.5–15.5)

## 2012-11-11 LAB — BASIC METABOLIC PANEL
Calcium: 9 mg/dL (ref 8.4–10.5)
GFR calc Af Amer: >90 mL/min (ref >90)
GFR calc non Af Amer: 81 mL/min — ABNORMAL LOW (ref >90)
Sodium: 135 mEq/L (ref 135–145)

## 2012-11-11 LAB — POCT ACTIVATED CLOTTING TIME: Activated Clotting Time: 388 seconds

## 2012-11-11 SURGERY — LEFT HEART CATHETERIZATION WITH CORONARY ANGIOGRAM
Anesthesia: LOCAL

## 2012-11-11 MED ORDER — PANTOPRAZOLE SODIUM 40 MG PO TBEC: 40.0000 mg | DELAYED_RELEASE_TABLET | Freq: Every day | ORAL | Status: DC

## 2012-11-11 MED ORDER — SODIUM CHLORIDE 0.9 % IV SOLN: 250.0000 mL | INTRAVENOUS | Status: DC | PRN

## 2012-11-11 MED ORDER — ISOSORBIDE MONONITRATE ER 60 MG PO TB24
120.0000 mg | ORAL_TABLET | Freq: Every day | ORAL | Status: DC
  Filled 2012-11-11: qty 2

## 2012-11-11 MED ORDER — ONDANSETRON HCL 4 MG/2ML IJ SOLN: 4.0000 mg | Freq: Four times a day (QID) | INTRAMUSCULAR | Status: DC | PRN

## 2012-11-11 MED ORDER — OXYCODONE-ACETAMINOPHEN 5-325 MG PO TABS: 1.0000 | ORAL_TABLET | ORAL | Status: DC | PRN

## 2012-11-11 MED ORDER — SODIUM CHLORIDE 0.9 % IV SOLN
INTRAVENOUS | Status: DC
  Administered 2012-11-11: 15:00:00 via INTRAVENOUS

## 2012-11-11 MED ORDER — PRASUGREL HCL 10 MG PO TABS
ORAL_TABLET | ORAL | Status: AC
  Filled 2012-11-11: qty 6

## 2012-11-11 MED ORDER — PRASUGREL HCL 10 MG PO TABS
10.0000 mg | ORAL_TABLET | Freq: Every day | ORAL | Status: DC
  Filled 2012-11-11: qty 1

## 2012-11-11 MED ORDER — HEPARIN (PORCINE) IN NACL 2-0.9 UNIT/ML-% IJ SOLN
INTRAMUSCULAR | Status: AC
  Filled 2012-11-11: qty 1000

## 2012-11-11 MED ORDER — ASPIRIN 81 MG PO CHEW
324.0000 mg | CHEWABLE_TABLET | ORAL | Status: AC
  Administered 2012-11-11: 324 mg via ORAL

## 2012-11-11 MED ORDER — NIACIN ER 500 MG PO CPCR
500.0000 mg | ORAL_CAPSULE | Freq: Two times a day (BID) | ORAL | Status: DC
  Filled 2012-11-11 (×3): qty 1

## 2012-11-11 MED ORDER — ASPIRIN 300 MG RE SUPP
300.0000 mg | RECTAL | Status: AC
  Filled 2012-11-11: qty 1

## 2012-11-11 MED ORDER — HYDRALAZINE HCL 25 MG PO TABS
25.0000 mg | ORAL_TABLET | Freq: Three times a day (TID) | ORAL | Status: DC
  Administered 2012-11-11: 22:00:00 25 mg via ORAL
  Filled 2012-11-11 (×4): qty 1

## 2012-11-11 MED ORDER — SODIUM CHLORIDE 0.9 % IJ SOLN: 3.0000 mL | INTRAMUSCULAR | Status: DC | PRN

## 2012-11-11 MED ORDER — POTASSIUM CHLORIDE CRYS ER 20 MEQ PO TBCR
20.0000 meq | EXTENDED_RELEASE_TABLET | Freq: Two times a day (BID) | ORAL | Status: DC
  Administered 2012-11-11: 22:00:00 20 meq via ORAL
  Filled 2012-11-11 (×3): qty 1

## 2012-11-11 MED ORDER — NITROGLYCERIN 0.4 MG SL SUBL: 0.4000 mg | SUBLINGUAL_TABLET | SUBLINGUAL | Status: DC | PRN

## 2012-11-11 MED ORDER — ASPIRIN EC 81 MG PO TBEC
81.0000 mg | DELAYED_RELEASE_TABLET | Freq: Every day | ORAL | Status: DC
  Filled 2012-11-11: qty 1

## 2012-11-11 MED ORDER — HYDROMORPHONE HCL PF 2 MG/ML IJ SOLN
INTRAMUSCULAR | Status: AC
  Filled 2012-11-11: qty 1

## 2012-11-11 MED ORDER — ENOXAPARIN SODIUM 40 MG/0.4ML ~~LOC~~ SOLN
40.0000 mg | SUBCUTANEOUS | Status: DC
  Filled 2012-11-11: qty 0.4

## 2012-11-11 MED ORDER — ACETAMINOPHEN 325 MG PO TABS: 650.0000 mg | ORAL_TABLET | ORAL | Status: DC | PRN

## 2012-11-11 MED ORDER — NIACIN ER 500 MG PO TBCR
500.0000 mg | EXTENDED_RELEASE_TABLET | Freq: Two times a day (BID) | ORAL | Status: DC
  Filled 2012-11-11: qty 1

## 2012-11-11 MED ORDER — ASPIRIN 81 MG PO CHEW
324.0000 mg | CHEWABLE_TABLET | ORAL | Status: DC
  Filled 2012-11-11: qty 4

## 2012-11-11 MED ORDER — MIDAZOLAM HCL 2 MG/2ML IJ SOLN
INTRAMUSCULAR | Status: AC
  Filled 2012-11-11: qty 2

## 2012-11-11 MED ORDER — FUROSEMIDE 40 MG PO TABS
40.0000 mg | ORAL_TABLET | Freq: Every day | ORAL | Status: DC
  Filled 2012-11-11: qty 1

## 2012-11-11 MED ORDER — IPRATROPIUM-ALBUTEROL 18-103 MCG/ACT IN AERO
2.0000 | INHALATION_SPRAY | Freq: Four times a day (QID) | RESPIRATORY_TRACT | Status: DC | PRN
  Filled 2012-11-11: qty 14.7

## 2012-11-11 MED ORDER — BIVALIRUDIN 250 MG IV SOLR
INTRAVENOUS | Status: AC
  Filled 2012-11-11: qty 250

## 2012-11-11 MED ORDER — NITROGLYCERIN 0.2 MG/ML ON CALL CATH LAB
INTRAVENOUS | Status: AC
  Filled 2012-11-11: qty 1

## 2012-11-11 MED ORDER — SODIUM CHLORIDE 0.9 % IJ SOLN
3.0000 mL | Freq: Two times a day (BID) | INTRAMUSCULAR | Status: DC
  Administered 2012-11-11: 3 mL via INTRAVENOUS

## 2012-11-11 MED ORDER — LIDOCAINE HCL (PF) 1 % IJ SOLN
INTRAMUSCULAR | Status: AC
  Filled 2012-11-11: qty 30

## 2012-11-11 MED ORDER — ATORVASTATIN CALCIUM 20 MG PO TABS
20.0000 mg | ORAL_TABLET | Freq: Every day | ORAL | Status: DC
  Filled 2012-11-11: qty 1

## 2012-11-11 MED ORDER — SODIUM CHLORIDE 0.9 % IV SOLN
INTRAVENOUS | Status: AC
  Administered 2012-11-11: 19:00:00 via INTRAVENOUS

## 2012-11-11 MED ORDER — ENOXAPARIN SODIUM 40 MG/0.4ML ~~LOC~~ SOLN
40.0000 mg | SUBCUTANEOUS | Status: DC
  Filled 2012-11-11 (×2): qty 0.4

## 2012-11-11 NOTE — Interval H&P Note (Signed)
History and Physical Interval Note:  11/11/2012 5:27 PM  Stephen Cohen  has presented today for surgery, with the diagnosis of Chest pain  The various methods of treatment have been discussed with the patient and family. After consideration of risks, benefits and other options for treatment, the patient has consented to  Procedure(s) (LRB) with comments: LEFT HEART CATHETERIZATION WITH CORONARY ANGIOGRAM (N/A) and possible angioplasty  as a surgical intervention .  The patient's history has been reviewed, patient examined, no change in status, stable for surgery.  I have reviewed the patient's chart and labs.  Questions were answered to the patient's satisfaction.     Pamella Pert

## 2012-11-11 NOTE — Progress Notes (Signed)
TR BAND REMOVAL  LOCATION:  left radial  DEFLATED PER PROTOCOL:  yes  TIME BAND OFF / DRESSING APPLIED:   2300    SITE UPON ARRIVAL:   Level 0  SITE AFTER BAND REMOVAL:  Level 0  REVERSE ALLEN'S TEST:    positive  CIRCULATION SENSATION AND MOVEMENT:  Within Normal Limits  yes  COMMENTS:

## 2012-11-11 NOTE — Interval H&P Note (Signed)
History and Physical Interval Note:  11/11/2012 5:33 PM  Stephen Cohen  has presented today for surgery, with the diagnosis of Chest pain  The various methods of treatment have been discussed with the patient and family. After consideration of risks, benefits and other options for treatment, the patient has consented to  Procedure(s) (LRB) with comments: LEFT HEART CATHETERIZATION WITH CORONARY ANGIOGRAM (N/A) as a surgical intervention .  The patient's history has been reviewed, patient examined, no change in status, stable for surgery.  I have reviewed the patient's chart and labs.  Questions were answered to the patient's satisfaction.     [facility]  Labs from [facility] this morning normal. BMP form admission pending. But ok to proceed.

## 2012-11-11 NOTE — H&P (Signed)
Stephen Cohen is an 72 y.o. male.   Chief Complaint: Chest pain HPI: Patient is a 72 year old Caucasian male with history of known coronary artery disease and history of CABG in the past and peripheral arterial disease and history of left iliac occlusion status post right femoral to left femoral bypass surgery in 1995.  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  About 2 months ago he was recently treated for cellulitis in the groin area near the bypass, with complete resolution of his symptoms.  He was admitted to [facility] with chest pain and unstable angina pectoris.  Due to worsening chest pain, and need for cardiac catheterization I was called by Dr. Belva Crome, to admit the patient to [facility] for coronary angiography.  Patient also has severe left medical systolic dysfunction and is being worked up for possible ICD implantation in future. That he has significant bradycardia and is being evaluated for  He presented to [facility] 3 days ago and was ruled out for myocardial infarction. He however continue to complain of chest pain and hence was referred for cardiac catheterization and transferred to [facility]. Today patient states that he has not had any chest pain since last evening and states that he is essentially back to his baseline. He does complain of shortness of breath and dyspnea on exertion but states that this is chronic. No palpitations. He smokes a pack of cigarettes a day and is not interested in quitting.   Past Medical History  Diagnosis Date  . Hypertension   . Hyperlipidemia   . Myocardial infarction 1995  . COPD (chronic obstructive pulmonary disease)   . GERD (gastroesophageal reflux disease)   . Hiatal hernia   . Peripheral vascular disease   . Arthritis   . Peptic ulcer disease     Past Surgical History  Procedure Date  . Pr vein bypass graft,aorto-fem-pop 1995  2005    Right to left Fem-Fem with  revision   . Appendectomy   . Coronary artery bypass graft 1995    No family history on file. Social History:  reports that he has been smoking Cigarettes.  He has a 65 pack-year smoking history. He has never used smokeless tobacco. He reports that he does not drink alcohol or use illicit drugs.  Allergies: No Known Allergies  Medications Prior to Admission  Medication Sig Dispense Refill  . albuterol-ipratropium (COMBIVENT) 18-103 MCG/ACT inhaler Inhale 2 puffs into the lungs every 6 (six) hours as needed. For shortness of breath      . amLODipine (NORVASC) 10 MG tablet Take 10 mg by mouth daily.      Marland Kitchen atorvastatin (LIPITOR) 20 MG tablet Take 20 mg by mouth daily.      Marland Kitchen esomeprazole (NEXIUM) 40 MG capsule Take 40 mg by mouth daily before breakfast.      . furosemide (LASIX) 40 MG tablet Take 40 mg by mouth daily.      . hydrALAZINE (APRESOLINE) 25 MG tablet Take 25 mg by mouth 3 (three) times daily. 1/2 tablet tid. Cutting down on medication due to heart rate per patient      . isosorbide mononitrate (IMDUR) 120 MG 24 hr tablet Take 120 mg by mouth daily.      . niacin (SLO-NIACIN) 500 MG tablet Take 500 mg by mouth 2 (two) times daily with a meal.      . nitroGLYCERIN (NITROSTAT) 0.4 MG SL tablet Place 0.4 mg  under the tongue every 5 (five) minutes as needed. For chest pain      . potassium chloride SA (K-DUR,KLOR-CON) 20 MEQ tablet Take 20 mEq by mouth 2 (two) times daily.          Review of Systems - Denies any bowel or bladder disturbances. He has chronic shortness of breath and dyspnea on exertion. Denies any symptoms of TIA or claudication. No fever, nausea or vomiting. No dark stools. No syncope.  Filed Vitals:   11/11/12 1500  BP: 152/76  Pulse: 78  Temp: 97.4 F (36.3 C)  Resp: 16  Height: 5\' 11"  (1.803 m)  Weight: 70.353 kg (155 lb 1.6 oz)  SpO2: 95%     General appearance: alert, cooperative and appears stated age Eyes: conjunctivae/corneas clear. PERRL, EOM's  intact. Fundi benign. Neck: no adenopathy, no carotid bruit, no JVD, supple, symmetrical, trachea midline and thyroid not enlarged, symmetric, no tenderness/mass/nodules Neck: JVP - normal, carotids 2+= without bruits Resp: clear to auscultation bilaterally and Emphysematous lung fields. Chest wall: no tenderness Cardio: regular rate and rhythm, S1, S2 normal, no murmur, click, rub or gallop GI: soft, non-tender; bowel sounds normal; no masses,  no organomegaly Extremities: extremities normal, atraumatic, no cyanosis or edema Pulses: 2+ and symmetric Fem-fem bypass surgical scar evident. Bilateral femoral arterial bruit present. 2+ pedal pulses and popliteal pulses bilaterally. No carotid bruit. Skin: Skin color, texture, turgor normal. No rashes or lesions Neurologic: Grossly normal  No results found for this or any previous visit (from the past 48 hour(s)). No results found.  Labs:   Lab Results  Component Value Date   WBC 15.7* 05/16/2009   HGB 13.1 05/16/2009   HCT 38.3* 05/16/2009   MCV 85.1 05/16/2009   PLT 180 05/16/2009    Assessment/Plan  1. Chest pain with negative cardiac markers, however patient has significant cardiovascular risk factors. 2. Hypokalemia 3. Coronary artery disease with history of CABG in 1995. 4. Peripheral arterial disease with history of right femoral to left femoral bypass surgery for occluded left common iliac artery. 5. COPD with ongoing tobacco use.   Recommendation: Patient transferred to [facility] for cardiac catheterization. I have discussed extensively the risks, benefits, alternatives for cardiac catheterization. Patient was proceed. He understands less than 1-2% risk of death, stroke, MI, urgent CABG, bleeding, infection but not limited to these. I will make further recommendations after cardiac catheterization. Patient is presently on beta blocker but it has been held due to baseline bradycardia.   Pamella Pert, MD 11/11/2012, 2:18  PM [facility] Pager: [phone] Office: [phone] If no answer: Cell:  [phone]

## 2012-11-11 NOTE — CV Procedure (Addendum)
Procedure performed:  Left heart catheterization including hemodynamic monitoring of the left ventricle, LV gram. Selective right and left coronary arteriography. Saphenous vein graft and left internal mammary angiography.  PTCA and stenting of the SVG to OM1 ostium with implantation of a 4.5 x 16 mm Veriflex non-drug-eluting stent, PTCA and stenting of the distal native RCA with implantation of a 3.5 x 16 mm promos Premier drug-eluting stent.  Indication: Patient is a 72 year-old male with history of CABG in 1995, hypertension,  hyperlipidemia, who presents with [place]. He presented to [facility] and patient after ruling out, was planned on being discharged, but continued to have chest discomfort. Hence was referred for cardiac catheterization and patient was transferred to [facility].  Hence is brought to the cardiac catheterization lab to evaluate  coronary anatomy for definitive diagnosis of CAD.  Hemodynamic data: Left ventricular pressure was 83/5 with LVEDP of 0 mm mercury. Aortic pressure was 82/53 with a mean of 63 mm mercury. There was no pressure gradient across the aortic valve.   Left ventricle: Performed in the RAO projection revealed LVEF of 25%. There was No significant MR. Diffuse moderate hypokinesis and mid to distal anterior and apical akinesis to dyskinesis as  wall motion abnormality.  Right coronary artery: Occluded in the proximal segment.,  Dominant.  SVG to RCA: Ostium has mild luminal irregularity. Otherwise smooth and widely patent. The native distal RCA gives origin to a small to moderate size PDA and a secondary large PDA branch. Between the 2 PDA branches, the distal RCA has a ulcerated 70-80% stenosis.  Left main coronary artery is large and normal.  Circumflex coronary artery: A large vessel giving origin to a large obtuse marginal 1 and OM 2 which are occluded in the proximal segment. Circumflex coronary artery continues in the AV groove and is mild  luminal irregularity.  SVG to OM 1 and OM 2: Ostium has a 80% stenosis. Otherwise the graft is smooth and normal. The native vessels or mild disease.  [place] intermediate: Moderate size vessel with mild luminal irregularity.   LAD:  LAD gives origin to a large diagonal-1.  LAD has mild diffuse proximal luminal irregularities. There is a 40% stenosis in the midsegment. Mid to distal LAD is occluded and is supplied by LIMA.  LIMA to LAD: Widely patent.  Interventional data: PTCA and stenting of the SVG to OM1 ostium with implantation of a 4.5 x 16 mm Veriflex non-drug-eluting stent, PTCA and stenting of the distal native RCA with implantation of a 3.5 x 16 mm promos Premier drug-eluting stent. Will need Dual antiplatelet therapy with Effient and ASA 81 mg for at least 1 year.   Technique of diagnostic cardiac catheterization:  Under sterile precautions using a 6 French Left radial  arterial access, a 6 French sheath was introduced into the right radial artery. A 5 [place] Tig 4 catheter was advanced into the ascending aorta selective  right coronary artery and left coronary artery was cannulated and angiography was performed in multiple views. Same cath was utilized to engage the saphenous vein graft to OM 1 and OM 2. The catheter was pulled back Out of the body over exchange length J-wire.  Same Catheter was used to perform LV gram which was performed in RAO projection before exchange the catheter out.. A 6 French bypass graft right catheter was utilized to engage the right coronary artery saphenous graft. Angiography was performed. Catheter exchanged out of the body over J-Wire.   Technique  of intervention:  Using a 6 French left bypass guide catheter with side hole the SVG to OM 1 and OM 2  coronary  was selected and cannulated. Using Angiomax for anticoagulation, I utilized a BMW guidewire and across the SVG to circumflex coronary artery without any difficulty. I placed the tip of the wire into  the distal  coronary artery. Angiography was performed.  I directly stented this graft with 4.5 x 16 mm Veriflex non-drug-eluting stent which was deployed at 14 atmospheric pressure for 60 seconds followed by gently withdrawing the same balloon outside of the stent proximally, and a repeat angioplasty was performed at 12 atmospheric pressure for 50 seconds and 20 seconds respectively. Excellent result was evident postangioplasty.   Attention was directed towards the right coronary artery. I utilized a 6 [place] right bypass graft catheter with sideholes and using the same guidewire as above, I placed the tip of the wire in the distal RCA in the PDA branch. I performed direct stenting with a 3.5 x 16 mm flow was Premier drug-eluting stent which was deployed at 11 atmospheric pressure for 60 seconds followed by repeat angiography. Excellent results without any residual stenosis was noted. The guidewire was withdrawn angiography repeated guide catheter disengaged and pulled into the left subclavian artery and left internal mammary artery was directly cannulated and angiography was performed and then the cath was pulled out of the body over J-wire. NO immediate complications noted. Hemostasis achieved with TR band. Patient tolerated the procedure well.   Disposition: Patient will be discharged in AM unless complications with out-patient follow up by Dr. Tomie China.

## 2012-11-12 LAB — CBC
Hemoglobin: 13.7 g/dL (ref 13.0–17.0)
MCH: 28.1 pg (ref 26.0–34.0)
MCHC: 33.3 g/dL (ref 30.0–36.0)
MCV: 84.4 fL (ref 78.0–100.0)

## 2012-11-12 LAB — BASIC METABOLIC PANEL
BUN: 13 mg/dL (ref 6–23)
Calcium: 9.1 mg/dL (ref 8.4–10.5)
Creatinine, Ser: 1.12 mg/dL (ref 0.50–1.35)
GFR calc non Af Amer: 64 mL/min — ABNORMAL LOW (ref >90)
Glucose, Bld: 95 mg/dL (ref 70–99)
Sodium: 138 mEq/L (ref 135–145)

## 2012-11-12 LAB — LIPID PANEL
LDL Cholesterol: 64 mg/dL (ref 0–99)
Triglycerides: 117 mg/dL (ref <150)

## 2012-11-12 LAB — PRO B NATRIURETIC PEPTIDE: Pro B Natriuretic peptide (BNP): 331.1 pg/mL — ABNORMAL HIGH (ref 0–125)

## 2012-11-12 MED ORDER — LOSARTAN POTASSIUM 25 MG PO TABS: 25.0000 mg | ORAL_TABLET | Freq: Every day | ORAL | Status: DC

## 2012-11-12 MED ORDER — PRASUGREL HCL 10 MG PO TABS: 10.0000 mg | ORAL_TABLET | Freq: Every day | ORAL | Status: DC

## 2012-11-12 MED ORDER — METOPROLOL SUCCINATE ER 25 MG PO TB24: 25.0000 mg | ORAL_TABLET | Freq: Every day | ORAL | Status: DC

## 2012-11-12 MED FILL — Dextrose Inj 5%: INTRAVENOUS | Qty: 50 | Status: AC

## 2012-11-12 NOTE — Progress Notes (Signed)
CARDIAC REHAB PHASE I   PRE:  Rate/Rhythm: 76SR  BP:  Supine:   Sitting: 158/89  Standing:    SaO2:   MODE:  Ambulation: 800 ft   POST:  Rate/Rhythem: 103ST  BP:  Supine:   Sitting: 168/86  Standing:    SaO2:  0750-0836 Pt walked 800 ft with fast pace. Gait steady. Denied CP. Some SOB noted but chart states this is chronic. Tolerated well. Ready to go home. Declined CRP 2. States will walk on his own. Encouraged smoking cessation and handouts given. Pt states he will quit cold [place] if he makes up his mind to do so. Quit for 9 months once [address] way. Education completed. Pt states he will continue to eat the way he has and uses the salt shaker. Left heart healthy diet for pt and wife and hit a few points with pt. He does eat lots of fish and vegetables. Voiced understanding of ed done.  [name]

## 2012-11-12 NOTE — Discharge Summary (Signed)
Physician Discharge Summary  Patient ID: Stephen Cohen MRN: [id number] DOB/AGE: 1941-03-29 72 y.o.  Admit date: 11/11/2012 Discharge date: 11/12/2012  Primary Discharge Diagnosis: [place], CAD Secondary Discharge Diagnosis  Ischemic cardiomyopathy with chronic systolic and diastolic heart failure COPD with ongoing tobacco use Hypertension Hyperlipidemia Status post CABG in 1995  Significant Diagnostic Studies: 10/12/13 PTCA and stenting of the SVG to OM1 ostium with implantation of a 4.5 x 16 mm Veriflex non-drug-eluting stent, PTCA and stenting of the distal native RCA with implantation of a 3.5 x 16 mm promos Premier drug-eluting stent. CABG grafts include LIMA to LAD, SVG to OM 1 and OM 2 and SVG to RCA  Hospital Course:  Patient is a 72 year old Caucasian male with history of known coronary artery disease and history of CABG in the past and peripheral arterial disease and history of left iliac occlusion status post right femoral to left femoral bypass surgery in 1995. 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 About 2 months ago he was recently treated for cellulitis in the groin area near the bypass, with complete resolution of his symptoms. He was admitted to [facility] with chest pain and unstable angina pectoris. Due to worsening chest pain, and need for cardiac catheterization I was called by Dr. Belva Crome, to admit the patient to [facility] for coronary angiography  Patient underwent coronary angiography and angioplasty the same day as hospital admission and the following day he was stable without any chest pain and is dilating in the hallway. He was felt stable for discharge. Smoking cessation was extensively discussed with the patient.  Recommendations on discharge: Patient to follow up with his primary cardiologist in [place] and his been recommended to make followup appointment calling them.  I have discussed the findings with Dr. Hulen Shouts  partner Dr. Belva Crome.  Discharge Exam: Blood pressure 131/83, pulse 62, temperature 97.4 F (36.3 C), temperature source Oral, resp. rate 18, height 5\' 11"  (1.803 m), weight 69.4 kg (153 lb), SpO2 99.00%.   Neck: no adenopathy, no carotid bruit, no JVD, supple, symmetrical, trachea midline and thyroid not enlarged, symmetric, no tenderness/mass/nodules  Neck: JVP - normal, carotids 2+= without bruits  Resp: clear to auscultation bilaterally and Emphysematous lung fields.  Chest wall: no tenderness  Cardio: regular rate and rhythm, S1, S2 normal, no murmur, click, rub or gallop  GI: soft, non-tender; bowel sounds normal; no masses, no organomegaly  Extremities: extremities normal, atraumatic, no cyanosis or edema  Pulses: 2+ and symmetric  Fem-fem bypass surgical scar evident. Bilateral femoral arterial bruit present. 2+ pedal pulses and popliteal pulses bilaterally. No carotid bruit.  Skin: Skin color, texture, turgor normal. No rashes or lesions  Neurologic: Grossly normal Left radial arterial access site has healed well.  Labs:   Lab Results  Component Value Date   WBC 11.6* 11/12/2012   HGB 13.7 11/12/2012   HCT 41.2 11/12/2012   MCV 84.4 11/12/2012   PLT 211 11/12/2012    Lab 11/12/12 0435  NA 138  K 3.9  CL 102  CO2 23  BUN 13  CREATININE 1.12  CALCIUM 9.1  PROT --  BILITOT --  ALKPHOS --  ALT --  AST --  GLUCOSE 95    Lipid Panel     Component Value Date/Time   CHOL 112 11/12/2012 0435   TRIG 117 11/12/2012 0435   HDL 25* 11/12/2012 0435   CHOLHDL 4.5 11/12/2012 0435   VLDL 23 11/12/2012 0435  LDLCALC 64 11/12/2012 0435    EKG:  Normal sinus rhythm, sinus bradycardia at rate of 56 beats per minute, normal axis, T-wave inversion noted in inferior and lateral leads more pronounced compared to yesterday's EKG. Cannot rule out inferolateral ischemia..   FOLLOW UP PLANS AND APPOINTMENTS Discharge Orders    Future Appointments: Provider: Department: Dept Phone: Center:     08/23/2013 3:00 PM [facility] [phone] [facility]   08/23/2013 3:30 PM Larina Earthly, MD [facility] [phone] [facility]       Medication List     As of 11/12/2012  7:48 AM    STOP taking these medications         amLODipine 10 MG tablet   Commonly known as: NORVASC      TAKE these medications         albuterol-ipratropium 18-103 MCG/ACT inhaler   Commonly known as: COMBIVENT   Inhale 2 puffs into the lungs every 6 (six) hours as needed. For shortness of breath      atorvastatin 20 MG tablet   Commonly known as: LIPITOR   Take 20 mg by mouth daily.      esomeprazole 40 MG capsule   Commonly known as: NEXIUM   Take 40 mg by mouth daily before breakfast.      furosemide 40 MG tablet   Commonly known as: LASIX   Take 40 mg by mouth daily.      hydrALAZINE 25 MG tablet   Commonly known as: APRESOLINE   Take 25 mg by mouth 3 (three) times daily. 1/2 tablet tid. Cutting down on medication due to heart rate per patient      isosorbide mononitrate 120 MG 24 hr tablet   Commonly known as: IMDUR   Take 120 mg by mouth daily.      losartan 25 MG tablet   Commonly known as: COZAAR   Take 1 tablet (25 mg total) by mouth daily.      metoprolol succinate 25 MG 24 hr tablet   Commonly known as: TOPROL-XL   Take 1 tablet (25 mg total) by mouth daily.      niacin 500 MG tablet   Commonly known as: SLO-NIACIN   Take 500 mg by mouth 2 (two) times daily with a meal.      nitroGLYCERIN 0.4 MG SL tablet   Commonly known as: NITROSTAT   Place 0.4 mg under the tongue every 5 (five) minutes as needed. For chest pain      potassium chloride SA 20 MEQ tablet   Commonly known as: K-DUR,KLOR-CON   Take 20 mEq by mouth 2 (two) times daily.      prasugrel 10 MG Tabs   Commonly known as: EFFIENT   Take 1 tablet (10 mg total) by mouth daily.           Follow-up Information    Call Norman Herrlich, MD. (Call today to  make appointment)    Contact information:   [address] [place] [address] [phone]           Pamella Pert, MD 11/12/2012, 7:48 AM  Pager: [phone] Office: [phone] If no answer: [phone]

## 2012-11-15 NOTE — Progress Notes (Signed)
Utilization Review Completed.   Jamilya Sarrazin, RN, BSN Nurse Case Manager  [phone]  

## 2013-02-01 HISTORY — PX: CARDIAC DEFIBRILLATOR PLACEMENT: SHX171

## 2013-08-23 ENCOUNTER — Ambulatory Visit: Payer: Medicare Other | Admitting: Vascular Surgery

## 2013-08-29 ENCOUNTER — Encounter: Payer: Self-pay | Admitting: Vascular Surgery

## 2013-08-30 ENCOUNTER — Encounter: Payer: Self-pay | Admitting: Vascular Surgery

## 2013-08-30 ENCOUNTER — Ambulatory Visit (INDEPENDENT_AMBULATORY_CARE_PROVIDER_SITE_OTHER): Payer: Medicare Other | Admitting: Vascular Surgery

## 2013-08-30 ENCOUNTER — Ambulatory Visit: Payer: Medicare Other | Admitting: Vascular Surgery

## 2013-08-30 ENCOUNTER — Ambulatory Visit (HOSPITAL_COMMUNITY)
Admission: RE | Admit: 2013-08-30 | Discharge: 2013-08-30 | Disposition: A | Discharge status: Home / Self Care | Payer: Medicare Other | Source: Ambulatory Visit | Attending: Vascular Surgery | Admitting: Vascular Surgery

## 2013-08-30 VITALS — BP 141/69 | HR 50 | Resp 14 | Ht 71.0 in | Wt 157.0 lb

## 2013-08-30 DIAGNOSIS — Z48812 Encounter for surgical aftercare following surgery on the circulatory system: Secondary | ICD-10-CM

## 2013-08-30 DIAGNOSIS — I70219 Atherosclerosis of native arteries of extremities with intermittent claudication, unspecified extremity: Secondary | ICD-10-CM

## 2013-08-30 DIAGNOSIS — R42 Dizziness and giddiness: Secondary | ICD-10-CM | POA: Insufficient documentation

## 2013-08-30 DIAGNOSIS — M79609 Pain in unspecified limb: Secondary | ICD-10-CM | POA: Insufficient documentation

## 2013-08-30 DIAGNOSIS — M7989 Other specified soft tissue disorders: Secondary | ICD-10-CM

## 2013-08-30 DIAGNOSIS — Z789 Other specified health status: Secondary | ICD-10-CM | POA: Insufficient documentation

## 2013-08-30 NOTE — Progress Notes (Signed)
Patient presents today for evaluation of lower surety arterial insufficiency. He is well known to me. He is 19 years status post a right to left fem-fem bypass. He has had multiple revisions since that time. 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 In 2010 he had episode of infection and actually arose in the midportion of the graft in the suprapubic area. He had resection of this and replacement with rifampin-soaked graft. He did have an area of erythema over this one year ago which was treated successfully with antibiotics only. Patient reports that in early June of this year he noted that he lost the pulse in his fem-fem bypass. He had been very faithful with checking this routinely. At the same time he began having a total left leg claudication symptoms. He has not had any rest pain and has not had any tissue loss. He reports that he is limited by his ability to walk due to his left leg claudication. He also has significant cardiac disease in the last years had a pacemaker and defibrillator placed. Despite this he is quite active and is looking forward to deer hunting this hunting season.  Past Medical History  Diagnosis Date  . Hypertension   . Hyperlipidemia   . Myocardial infarction 1995  . COPD (chronic obstructive pulmonary disease)   . GERD (gastroesophageal reflux disease)   . Hiatal hernia   . Peripheral vascular disease   . Arthritis   . Peptic ulcer disease   . Coronary artery disease   . Anginal pain   . Shortness of breath     History  Substance Use Topics  . Smoking status: Current Every Day Smoker -- 1.00 packs/day for 65 years    Types: Cigarettes  . Smokeless tobacco: Never Used     Comment: pt states that he is not going to quit  . Alcohol Use: No    Family History  Problem Relation Age of Onset  . Heart disease Mother   . Hyperlipidemia Mother   . Heart attack Mother   . Heart disease Father   . Hyperlipidemia Father   . Cancer Sister   . Heart  disease Sister   . Hyperlipidemia Sister   . Heart attack Sister   . Peripheral vascular disease Sister     No Known Allergies  Current outpatient prescriptions:albuterol-ipratropium (COMBIVENT) 18-103 MCG/ACT inhaler, Inhale 2 puffs into the lungs every 6 (six) hours as needed. For shortness of breath, Disp: , Rfl: ;  allopurinol (ZYLOPRIM) 100 MG tablet, daily. For gout, Disp: , Rfl: ;  amLODipine (NORVASC) 10 MG tablet, Take 10 mg by mouth daily. Halt Tab per day, Disp: , Rfl: ;  atorvastatin (LIPITOR) 20 MG tablet, Take 10 mg by mouth daily. , Disp: , Rfl:  esomeprazole (NEXIUM) 40 MG capsule, Take 40 mg by mouth daily before breakfast., Disp: , Rfl: ;  furosemide (LASIX) 40 MG tablet, Take 40 mg by mouth daily., Disp: , Rfl: ;  hydrALAZINE (APRESOLINE) 25 MG tablet, Take 25 mg by mouth 3 (three) times daily. 1/2 tablet tid. Cutting down on medication due to heart rate per patient, Disp: , Rfl: ;  losartan (COZAAR) 25 MG tablet, Take 1 tablet (25 mg total) by mouth daily., Disp: 30 tablet, Rfl: 0 metoprolol succinate (TOPROL-XL) 25 MG 24 hr tablet, Take 50 mg by mouth daily., Disp: , Rfl: ;  niacin (SLO-NIACIN) 500 MG tablet, Take 500 mg by mouth 2 (two) times daily with a meal., Disp: ,  Rfl: ;  nitroGLYCERIN (NITROSTAT) 0.4 MG SL tablet, Place 0.4 mg under the tongue every 5 (five) minutes as needed. For chest pain, Disp: , Rfl: ;  potassium chloride SA (K-DUR,KLOR-CON) 20 MEQ tablet, Take 20 mEq by mouth 2 (two) times daily., Disp: , Rfl:  prasugrel (EFFIENT) 10 MG TABS, Take 1 tablet (10 mg total) by mouth daily., Disp: 30 tablet, Rfl: 0;  isosorbide mononitrate (IMDUR) 120 MG 24 hr tablet, Take 120 mg by mouth daily., Disp: , Rfl:   BP 141/69  Pulse 50  Resp 14  Ht 5\' 11"  (1.803 m)  Wt 157 lb (71.215 kg)  BMI 21.91 kg/m2  SpO2 99%  Body mass index is 21.91 kg/(m^2).       Physical exam: Well-developed well-nourished gentleman appearing younger than stated age of 28. Rodarte is  without bruits bilaterally Neurologically he is grossly intact Pulse status 2+ radial pulses bilaterally 2+ right femoral pulse 2+ right popliteal pulse and 2+ dorsalis pedis and posterior tibial pulse on the right. On the left he has absent femoral and distal pulses He does have a palpable graft from his femoral to femoral artery. This is thrombosed with no pulse present.  Noninvasive studies today reveal ankle arm index of 0.54 on the left is normal on the right.  Impression and plan occluded left to right fem-fem was initially placed in 1995. He is limited by his claudication but reports that he is comfortable continued observation only. I explained the significance of any tissue loss or rest pain he was notify us immediately should this occur. We will see him again in 3 months for continued discussion. He would require CT angiogram for further evaluation prior to redo left to right fem-fem bypass

## 2013-09-06 ENCOUNTER — Ambulatory Visit: Payer: Medicare Other | Admitting: Family

## 2013-12-05 ENCOUNTER — Encounter: Payer: Self-pay | Admitting: Vascular Surgery

## 2013-12-06 ENCOUNTER — Other Ambulatory Visit: Payer: Self-pay | Admitting: *Deleted

## 2013-12-06 ENCOUNTER — Ambulatory Visit (INDEPENDENT_AMBULATORY_CARE_PROVIDER_SITE_OTHER): Payer: Medicare Other | Admitting: Vascular Surgery

## 2013-12-06 ENCOUNTER — Encounter: Payer: Self-pay | Admitting: *Deleted

## 2013-12-06 ENCOUNTER — Encounter: Payer: Self-pay | Admitting: Vascular Surgery

## 2013-12-06 VITALS — BP 153/68 | HR 50 | Ht 71.0 in | Wt 167.0 lb

## 2013-12-06 DIAGNOSIS — I70219 Atherosclerosis of native arteries of extremities with intermittent claudication, unspecified extremity: Secondary | ICD-10-CM

## 2013-12-06 DIAGNOSIS — Z01810 Encounter for preprocedural cardiovascular examination: Secondary | ICD-10-CM

## 2013-12-06 NOTE — Progress Notes (Signed)
Patient name: Stephen Cohen MRN: [id number] DOB: September 21, 1941 Sex: male   Referred by: Clovis RileyMitchell  Reason for referral:  Chief Complaint  Patient presents with  . Re-evaluation    3 month f/u     HISTORY OF PRESENT ILLNESS:  Patient has today for continued discussion regarding his left leg claud femoral-femoral bypasses. This initially wasication. He is well known to me from multiple prior surgeries. He initially had fem-fem bypass 20 years ago. He has had several revisions and I said replacement of a portion where he arrived in the midportion of his fem-fem graft. She evidence of infection. Clinically he occluded his fem-fem graft in June. I saw in 3 months ago he was having limiting claudication that time but was unable to take time for surgery. He has had progressive symptoms. Fortunately has had no tissue loss or rest pain. He reports to leg claudication with walking a short distance and this is severe diffuse any activity such as moving his trashcan. He wishes to proceed with surgery. He has remained stable from a cardiac standpoint status post stent placement one year ago.  Past Medical History  Diagnosis Date  . Hypertension   . Hyperlipidemia   . Myocardial infarction 1995  . COPD (chronic obstructive pulmonary disease)   . GERD (gastroesophageal reflux disease)   . Hiatal hernia   . Peripheral vascular disease   . Arthritis   . Peptic ulcer disease   . Coronary artery disease   . Anginal pain   . Shortness of breath     Past Surgical History  Procedure Laterality Date  . Pr vein bypass graft,aorto-fem-pop  1995  2005    Right to left Fem-Fem with revision   . Appendectomy    . Coronary artery bypass graft  1995  . Left heart cath Left 11-11-12  . Pace maker  February 01, 2013    History   Social History  . Marital Status: Married    Spouse Name: N/A    Number of Children: N/A  . Years of Education: N/A   Occupational History  . Not on file.   Social History  Main Topics  . Smoking status: Current Every Day Smoker -- 1.00 packs/day for 65 years    Types: Cigarettes  . Smokeless tobacco: Never Used     Comment: pt states that he is not going to quit  . Alcohol Use: No  . Drug Use: No  . Sexual Activity: Not on file   Other Topics Concern  . Not on file   Social History Narrative  . No narrative on file    Family History  Problem Relation Age of Onset  . Heart disease Mother   . Hyperlipidemia Mother   . Heart attack Mother   . Heart disease Father   . Hyperlipidemia Father   . Cancer Sister   . Heart disease Sister   . Hyperlipidemia Sister   . Heart attack Sister   . Peripheral vascular disease Sister     Allergies as of 12/06/2013  . (No Known Allergies)    Current Outpatient Prescriptions on File Prior to Visit  Medication Sig Dispense Refill  . albuterol-ipratropium (COMBIVENT) 18-103 MCG/ACT inhaler Inhale 2 puffs into the lungs every 6 (six) hours as needed. For shortness of breath      . allopurinol (ZYLOPRIM) 100 MG tablet daily. For gout      . amLODipine (NORVASC) 10 MG tablet Take 10 mg by mouth daily.  Halt Tab per day      . atorvastatin (LIPITOR) 20 MG tablet Take 10 mg by mouth daily.       Marland Kitchen esomeprazole (NEXIUM) 40 MG capsule Take 40 mg by mouth daily before breakfast.      . furosemide (LASIX) 40 MG tablet Take 40 mg by mouth daily.      . hydrALAZINE (APRESOLINE) 25 MG tablet Take 25 mg by mouth 3 (three) times daily. 1/2 tablet tid. Cutting down on medication due to heart rate per patient      . isosorbide mononitrate (IMDUR) 120 MG 24 hr tablet Take 120 mg by mouth daily.      Marland Kitchen losartan (COZAAR) 25 MG tablet Take 1 tablet (25 mg total) by mouth daily.  30 tablet  0  . metoprolol succinate (TOPROL-XL) 25 MG 24 hr tablet Take 50 mg by mouth daily.      . niacin (SLO-NIACIN) 500 MG tablet Take 500 mg by mouth 2 (two) times daily with a meal.      . nitroGLYCERIN (NITROSTAT) 0.4 MG SL tablet Place 0.4 mg  under the tongue every 5 (five) minutes as needed. For chest pain      . potassium chloride SA (K-DUR,KLOR-CON) 20 MEQ tablet Take 20 mEq by mouth 2 (two) times daily.      . prasugrel (EFFIENT) 10 MG TABS Take 1 tablet (10 mg total) by mouth daily.  30 tablet  0   No current facility-administered medications on file prior to visit.       PHYSICAL EXAMINATION:  General: The patient is a well-nourished male, in no acute distress. Vital signs are BP 153/68  Pulse 50  Ht 5\' 11"  (1.803 m)  Wt 167 lb (75.751 kg)  BMI 23.30 kg/m2  SpO2 99% Pulmonary: There is a good air exchange   Musculoskeletal: There are no major deformities.  There is no significant extremity pain. Neurologic: No focal weakness or paresthesias are detected, Skin: There are no ulcer or rashes noted. Psychiatric: The patient has normal affect. Cardiovascular: 2+ right femoral and right posterior tibial pulse, absent left femoral and left pedal pulses His groin incisions showed no evidence of infection. There is question of fluid around the graft in the mid valley with no tenderness over this area.   [facility] Vascular Lab Studies:  Ordered and Independently Reviewed studies from October 2014 revealed occlusion of his graft with maximum index of 0.5 on the left and normal on the right  Impression and Plan:  Limiting claudication entire left leg related to occlusion of his right to left fem-fem bypass which was initially placed in 1995. We will obtain a CT angiogram for evaluation of his runoff status on the left. We will plan redo right to left fem-fem bypass on 12/12/2013.    Germani Gavilanes [facility] [place] Office: [phone]

## 2013-12-06 NOTE — Pre-Procedure Instructions (Addendum)
Orvilla FusBilly R Bouley  12/06/2013   Your procedure is scheduled on:  Monday, February 2.  [facility], Main [facility] / Entrance "A" at 5:30 AM.  Call this number if you have problems the morning of surgery: [phone]   Remember:   Do not eat food or drink liquids after midnight Sunday.   Take these medicines the morning of surgery with A SIP OF WATER: amLODipine (NORVASC), hydrALAZINE (APRESOLINE), isosorbide mononitrate (IMDUR), metoprolol succinate (TOPROL-XL), Esomeprazole Magnesium (NEXIUM PO).  Take if needed:nitroGLYCERIN (NITROSTAT).   Do not wear jewelry, make-up or nail polish.  Do not wear lotions, powders, or perfumes. You may wear deodorant.             Men may shave face and neck.  Do not bring valuables to the hospital.  [facility] is not responsiblefor any belongings or valuables.               Contacts, dentures or bridgework may not be worn into surgery.  Leave suitcase in the car. After surgery it may be brought to your room.  For patients admitted to the hospital, discharge time is determined by your treatment team.              Special Instructions: Shower using CHG 2 nights before surgery and the night before surgery.  If you shower the day of surgery use CHG.  Use special wash - you have one bottle of CHG for all showers.  You should use approximately 1/3 of the bottle for each shower.   Please read over the following fact sheets that you were given: Pain Booklet, Coughing and Deep Breathing, Blood Transfusion Information and Surgical Site Infection Prevention

## 2013-12-07 ENCOUNTER — Encounter (HOSPITAL_COMMUNITY)
Admission: RE | Admit: 2013-12-07 | Discharge: 2013-12-07 | Disposition: A | Discharge status: Home / Self Care | Payer: Medicare Other | Source: Ambulatory Visit | Attending: Vascular Surgery | Admitting: Vascular Surgery

## 2013-12-07 ENCOUNTER — Encounter (HOSPITAL_COMMUNITY): Payer: Self-pay

## 2013-12-07 DIAGNOSIS — Z01818 Encounter for other preprocedural examination: Secondary | ICD-10-CM | POA: Insufficient documentation

## 2013-12-07 DIAGNOSIS — Z01812 Encounter for preprocedural laboratory examination: Secondary | ICD-10-CM | POA: Insufficient documentation

## 2013-12-07 HISTORY — DX: Unspecified hearing loss, unspecified ear: H91.90

## 2013-12-07 HISTORY — DX: Presence of automatic (implantable) cardiac defibrillator: Z95.810

## 2013-12-07 LAB — CBC
HCT: 43.9 % (ref 39.0–52.0)
Hemoglobin: 14.7 g/dL (ref 13.0–17.0)
MCH: 30.3 pg (ref 26.0–34.0)
MCHC: 33.5 g/dL (ref 30.0–36.0)
MCV: 90.5 fL (ref 78.0–100.0)
PLATELETS: 232 10*3/uL (ref 150–400)
RBC: 4.85 MIL/uL (ref 4.22–5.81)
RDW: 13.7 % (ref 11.5–15.5)
WBC: 11.1 10*3/uL — AB (ref 4.0–10.5)

## 2013-12-07 LAB — APTT: aPTT: 36 seconds (ref 24–37)

## 2013-12-07 LAB — TYPE AND SCREEN
ABO/RH(D): O NEG
Antibody Screen: NEGATIVE

## 2013-12-07 LAB — COMPREHENSIVE METABOLIC PANEL
ALT: 15 U/L (ref 0–53)
AST: 19 U/L (ref 0–37)
Albumin: 3.8 g/dL (ref 3.5–5.2)
Alkaline Phosphatase: 106 U/L (ref 39–117)
BILIRUBIN TOTAL: 0.6 mg/dL (ref 0.3–1.2)
BUN: 12 mg/dL (ref 6–23)
CHLORIDE: 106 meq/L (ref 96–112)
CO2: 24 meq/L (ref 19–32)
Calcium: 9.1 mg/dL (ref 8.4–10.5)
Creatinine, Ser: 1.12 mg/dL (ref 0.50–1.35)
GFR calc Af Amer: 74 mL/min — ABNORMAL LOW (ref >90)
GFR calc non Af Amer: 64 mL/min — ABNORMAL LOW (ref >90)
GLUCOSE: 98 mg/dL (ref 70–99)
Potassium: 4.3 mEq/L (ref 3.7–5.3)
SODIUM: 143 meq/L (ref 137–147)
Total Protein: 7.5 g/dL (ref 6.0–8.3)

## 2013-12-07 LAB — SURGICAL PCR SCREEN
MRSA, PCR: NEGATIVE
STAPHYLOCOCCUS AUREUS: NEGATIVE

## 2013-12-07 LAB — URINALYSIS, ROUTINE W REFLEX MICROSCOPIC
Bilirubin Urine: NEGATIVE
GLUCOSE, UA: NEGATIVE mg/dL
Hgb urine dipstick: NEGATIVE
Ketones, ur: NEGATIVE mg/dL
Leukocytes, UA: NEGATIVE
Nitrite: NEGATIVE
PH: 6 (ref 5.0–8.0)
Protein, ur: NEGATIVE mg/dL
SPECIFIC GRAVITY, URINE: 1.006 (ref 1.005–1.030)
Urobilinogen, UA: 0.2 mg/dL (ref 0.0–1.0)

## 2013-12-07 LAB — PROTIME-INR
INR: 0.99 (ref 0.00–1.49)
Prothrombin Time: 12.9 seconds (ref 11.6–15.2)

## 2013-12-07 NOTE — Progress Notes (Signed)
12/07/13 0928  OBSTRUCTIVE SLEEP APNEA  Have you ever been diagnosed with sleep apnea through a sleep study? No  Do you snore loudly (loud enough to be heard through closed doors)?  0  Do you often feel tired, fatigued, or sleepy during the daytime? 0  Has anyone observed you stop breathing during your sleep? 1  Do you have, or are you being treated for high blood pressure? 1  BMI more than 35 kg/m2? 0  Age over 73 years old? 1  Neck circumference greater than 40 cm/18 inches? 0 (15.5)  Gender: 1  Obstructive Sleep Apnea Score 4  Score 4 or greater  Results sent to PCP

## 2013-12-07 NOTE — Progress Notes (Signed)
Stephen Cohen here for PAT appointment prior to Fem- Fem Byass Graft on February 2.  Stephen Cohen had a pacemaker/Debib inserted at [facility] on 2014.  Stephen Cohen reports that the surgeon who put the ICD in checks it every 6 months.  I requested information from [facility] regarding that visit: EKG, Chest X RAY , D/C notes.  I will request information from surgeon when I receive his name.  I faxes a request for office notes and cardiac testing to Stephen Cohen at [facility] in [place].   Stephen Cohen reported that he was on Effient until the end of Dec, "I had 2 stents put in my heart here by Stephen Cohen 11/11/12 and I had a note to take Effient for 1 year."  "I take Aspirin 81mg  every day ."  I asked patient if he had spoken with Stephen Cohen his cardiologist about stopping Effient, he said no, "but he has the information I have from Stephen Cohen."   Stephen Cohen denies chest pain, "I have only taken 2 NTG since I had my pacemaker/defifulator put in 10 months ago. Patient denies taking any NTG in past 2 months.  I will ask Stephen Cohen to see the chart.

## 2013-12-08 NOTE — Progress Notes (Signed)
Anesthesia Chart Review:  Patient is a 73 year old male scheduled for redo right to left FFBG on 12/12/13 by Dr. Arbie CookeyEarly.  History includes smoking, CAD/MI s/p CABG '95 and  s/p SVG to OM1 non-DES and DES to disatl RCA 10/12/13 (Dr. Jacinto HalimGanji), ischemic CM s/p [employer] Jude Elipse ICD 02/01/13 ([facility]; Dr. Clydie Braunavid Fitzgerald), PAD s/p FFBG ~ 20 years ago s/p revision but occluded since 04/2013, HTN, HLD, COPD, GERD, hiatal hernia, PUD. OSA screening score was 4. PCP is Dr. Gabriel Cirriajan Mitchell.  His primary cardiologist is Dr. Norman HerrlichBrian Munley with [facility] in [place].  Dr. Dulce SellarMunley gave permission for patient to be off Effient for surgery, but recommended continuing ASA. His last AICD interrogation was on 09/22/13 with no sustained arrhythmias noted and with normal device function. An intraoperative magnet over his AICD has been recommended.    Echo on 12/29/12 [facility]) showed: Moderately dilated LV, severe systolic dysfunction with akinesia of the mid anteroseptal and septal segments, the apical septal, inferior, anterior and lateral segments and the apex. Visually estimated EF 30%.  Abnormal diastolic function. Mild LAE. Mild MR, trivial pulmonary regurgitation.   Holter monitor on 12/09/12 showed frequent PVCs, APCs, and brief atrial tachycardia.  Cardiac cath on 11/11/12 showed: Left ventricle: Performed in the RAO projection revealed LVEF of 25%. There was No significant MR. Diffuse moderate hypokinesis and mid to distal anterior and apical akinesis to dyskinesis as wall motion abnormality.  Right coronary artery: Occluded in the proximal segment., Dominant.  SVG to RCA: Ostium has mild luminal irregularity. Otherwise smooth and widely patent. The native distal RCA gives origin to a small to moderate size PDA and a secondary large PDA branch. Between the 2 PDA branches, the distal RCA has a ulcerated 70-80% stenosis.  Left main coronary artery is large and normal.  Circumflex coronary artery: A large  vessel giving origin to a large obtuse marginal 1 and OM 2 which are occluded in the proximal segment. Circumflex coronary artery continues in the AV groove and is mild luminal irregularity.  SVG to OM 1 and OM 2: Ostium has a 80% stenosis. Otherwise the graft is smooth and normal. The native vessels or mild disease.  Ramus intermediate: Moderate size vessel with mild luminal irregularity.  LAD: LAD gives origin to a large diagonal-1. LAD has mild diffuse proximal luminal irregularities. There is a 40% stenosis in the midsegment. Mid to distal LAD is occluded and is supplied by LIMA.  LIMA to LAD: Widely patent. Interventional data: PTCA and stenting of the SVG to OM1 ostium with implantation of a 4.5 x 16 mm Veriflex non-drug-eluting stent, PTCA and stenting of the distal native RCA with implantation of a 3.5 x 16 mm promos Premier drug-eluting stent. Will need Dual antiplatelet therapy with Effient and ASA 81 mg for at least 1 year.   [facility]12 and showed SB, non-specific QRS widening, rightward axis, anterior infarct (age undetermined).  EKG will be > 73 year old, so he will need one on arrival.  CXR on 02/02/13 ([facility]) showed: Prior median sternotomy. Stable cardiomegaly with emphysema and chronic interstitial coarsening.  Left sided pacer/AICD remains in place.    Preoperative labs noted.  If no acute changes then I anticipate that he can proceed as planned.  Velna Ochsllison Zelenak, PA-C [facility]iology Phone [phone] 12/08/2013 11:53 AM

## 2013-12-09 ENCOUNTER — Ambulatory Visit
Admission: RE | Admit: 2013-12-09 | Discharge: 2013-12-09 | Disposition: A | Discharge status: Home / Self Care | Payer: Medicare Other | Source: Ambulatory Visit | Attending: Vascular Surgery | Admitting: Vascular Surgery

## 2013-12-09 DIAGNOSIS — Z01810 Encounter for preprocedural cardiovascular examination: Secondary | ICD-10-CM

## 2013-12-09 DIAGNOSIS — I70219 Atherosclerosis of native arteries of extremities with intermittent claudication, unspecified extremity: Secondary | ICD-10-CM

## 2013-12-09 MED ORDER — IOHEXOL 350 MG/ML SOLN
150.0000 mL | Freq: Once | INTRAVENOUS | Status: AC | PRN
Start: 2013-12-09 — End: 2013-12-09
  Administered 2013-12-09: 150 mL via INTRAVENOUS

## 2013-12-11 MED ORDER — DEXTROSE 5 % IV SOLN
1.5000 g | INTRAVENOUS | Status: AC
  Administered 2013-12-12: 1.5 g via INTRAVENOUS
  Filled 2013-12-11: qty 1.5

## 2013-12-12 ENCOUNTER — Encounter (HOSPITAL_COMMUNITY): Payer: Medicare Other | Admitting: Vascular Surgery

## 2013-12-12 ENCOUNTER — Inpatient Hospital Stay (HOSPITAL_COMMUNITY)
Admission: RE | Admit: 2013-12-12 | Discharge: 2013-12-13 | DRG: 253 | Disposition: A | Discharge status: Home / Self Care | Payer: Medicare Other | Source: Ambulatory Visit | Attending: Vascular Surgery | Admitting: Vascular Surgery

## 2013-12-12 ENCOUNTER — Inpatient Hospital Stay (HOSPITAL_COMMUNITY): Payer: Medicare Other | Admitting: Anesthesiology

## 2013-12-12 ENCOUNTER — Encounter (HOSPITAL_COMMUNITY)
Admission: RE | Disposition: A | Discharge status: Home / Self Care | Payer: Self-pay | Source: Ambulatory Visit | Attending: Vascular Surgery

## 2013-12-12 ENCOUNTER — Encounter (HOSPITAL_COMMUNITY): Payer: Self-pay | Admitting: Surgery

## 2013-12-12 DIAGNOSIS — J4489 Other specified chronic obstructive pulmonary disease: Secondary | ICD-10-CM | POA: Diagnosis present

## 2013-12-12 DIAGNOSIS — I739 Peripheral vascular disease, unspecified: Secondary | ICD-10-CM | POA: Diagnosis present

## 2013-12-12 DIAGNOSIS — I70219 Atherosclerosis of native arteries of extremities with intermittent claudication, unspecified extremity: Principal | ICD-10-CM | POA: Diagnosis present

## 2013-12-12 DIAGNOSIS — E785 Hyperlipidemia, unspecified: Secondary | ICD-10-CM | POA: Diagnosis present

## 2013-12-12 DIAGNOSIS — Z9861 Coronary angioplasty status: Secondary | ICD-10-CM

## 2013-12-12 DIAGNOSIS — Z950 Presence of cardiac pacemaker: Secondary | ICD-10-CM

## 2013-12-12 DIAGNOSIS — I2510 Atherosclerotic heart disease of native coronary artery without angina pectoris: Secondary | ICD-10-CM | POA: Diagnosis present

## 2013-12-12 DIAGNOSIS — F17200 Nicotine dependence, unspecified, uncomplicated: Secondary | ICD-10-CM | POA: Diagnosis present

## 2013-12-12 DIAGNOSIS — H9190 Unspecified hearing loss, unspecified ear: Secondary | ICD-10-CM | POA: Diagnosis present

## 2013-12-12 DIAGNOSIS — I10 Essential (primary) hypertension: Secondary | ICD-10-CM | POA: Diagnosis present

## 2013-12-12 DIAGNOSIS — I5042 Chronic combined systolic (congestive) and diastolic (congestive) heart failure: Secondary | ICD-10-CM | POA: Diagnosis present

## 2013-12-12 DIAGNOSIS — Z79899 Other long term (current) drug therapy: Secondary | ICD-10-CM

## 2013-12-12 DIAGNOSIS — J449 Chronic obstructive pulmonary disease, unspecified: Secondary | ICD-10-CM | POA: Diagnosis present

## 2013-12-12 DIAGNOSIS — I509 Heart failure, unspecified: Secondary | ICD-10-CM | POA: Diagnosis present

## 2013-12-12 DIAGNOSIS — I252 Old myocardial infarction: Secondary | ICD-10-CM

## 2013-12-12 DIAGNOSIS — K219 Gastro-esophageal reflux disease without esophagitis: Secondary | ICD-10-CM | POA: Diagnosis present

## 2013-12-12 DIAGNOSIS — Z951 Presence of aortocoronary bypass graft: Secondary | ICD-10-CM

## 2013-12-12 HISTORY — PX: FEMORAL-FEMORAL BYPASS GRAFT: SHX936

## 2013-12-12 HISTORY — PX: REMOVAL OF GRAFT: SHX6361

## 2013-12-12 LAB — GRAM STAIN

## 2013-12-12 LAB — CBC
HCT: 41 % (ref 39.0–52.0)
HEMOGLOBIN: 13.8 g/dL (ref 13.0–17.0)
MCH: 30.4 pg (ref 26.0–34.0)
MCHC: 33.7 g/dL (ref 30.0–36.0)
MCV: 90.3 fL (ref 78.0–100.0)
Platelets: 219 10*3/uL (ref 150–400)
RBC: 4.54 MIL/uL (ref 4.22–5.81)
RDW: 13.7 % (ref 11.5–15.5)
WBC: 17.5 10*3/uL — AB (ref 4.0–10.5)

## 2013-12-12 LAB — CREATININE, SERUM
CREATININE: 1.23 mg/dL (ref 0.50–1.35)
GFR calc non Af Amer: 57 mL/min — ABNORMAL LOW (ref >90)
GFR, EST AFRICAN AMERICAN: 66 mL/min — AB (ref >90)

## 2013-12-12 SURGERY — CREATION, BYPASS, ARTERIAL, FEMORAL TO FEMORAL, USING GRAFT
Anesthesia: General | Site: Groin

## 2013-12-12 MED ORDER — PROPOFOL 10 MG/ML IV BOLUS
INTRAVENOUS | Status: AC
  Filled 2013-12-12: qty 20

## 2013-12-12 MED ORDER — METOPROLOL TARTRATE 1 MG/ML IV SOLN: 2.0000 mg | INTRAVENOUS | Status: DC | PRN

## 2013-12-12 MED ORDER — GLYCOPYRROLATE 0.2 MG/ML IJ SOLN
INTRAMUSCULAR | Status: DC | PRN
  Administered 2013-12-12: 0.4 mg via INTRAVENOUS

## 2013-12-12 MED ORDER — NEOSTIGMINE METHYLSULFATE 1 MG/ML IJ SOLN
INTRAMUSCULAR | Status: DC | PRN
Start: 2013-12-12 — End: 2013-12-12
  Administered 2013-12-12: 2 mg via INTRAVENOUS

## 2013-12-12 MED ORDER — ENOXAPARIN SODIUM 40 MG/0.4ML ~~LOC~~ SOLN
40.0000 mg | SUBCUTANEOUS | Status: DC
  Filled 2013-12-12 (×2): qty 0.4

## 2013-12-12 MED ORDER — ACETAMINOPHEN 650 MG RE SUPP: 325.0000 mg | RECTAL | Status: DC | PRN

## 2013-12-12 MED ORDER — HEPARIN SODIUM (PORCINE) 1000 UNIT/ML IJ SOLN
INTRAMUSCULAR | Status: DC | PRN
Start: 2013-12-12 — End: 2013-12-12
  Administered 2013-12-12: 7000 [IU] via INTRAVENOUS

## 2013-12-12 MED ORDER — ROCURONIUM BROMIDE 50 MG/5ML IV SOLN
INTRAVENOUS | Status: AC
Start: 2013-12-12 — End: 2013-12-12
  Filled 2013-12-12: qty 1

## 2013-12-12 MED ORDER — AMLODIPINE BESYLATE 5 MG PO TABS
5.0000 mg | ORAL_TABLET | Freq: Every day | ORAL | Status: DC
Start: 2013-12-13 — End: 2013-12-13
  Filled 2013-12-12: qty 1

## 2013-12-12 MED ORDER — GLYCOPYRROLATE 0.2 MG/ML IJ SOLN
INTRAMUSCULAR | Status: AC
Start: 2013-12-12 — End: 2013-12-12
  Filled 2013-12-12: qty 2

## 2013-12-12 MED ORDER — DIPHENHYDRAMINE HCL 25 MG PO CAPS: 25.0000 mg | ORAL_CAPSULE | Freq: Every day | ORAL | Status: DC | PRN

## 2013-12-12 MED ORDER — DOCUSATE SODIUM 100 MG PO CAPS
100.0000 mg | ORAL_CAPSULE | Freq: Every day | ORAL | Status: DC
Start: 2013-12-13 — End: 2013-12-13

## 2013-12-12 MED ORDER — ONDANSETRON HCL 4 MG/2ML IJ SOLN: 4.0000 mg | Freq: Four times a day (QID) | INTRAMUSCULAR | Status: DC | PRN

## 2013-12-12 MED ORDER — NEOSTIGMINE METHYLSULFATE 1 MG/ML IJ SOLN
INTRAMUSCULAR | Status: AC
  Filled 2013-12-12: qty 10

## 2013-12-12 MED ORDER — LACTATED RINGERS IV SOLN
INTRAVENOUS | Status: DC | PRN
  Administered 2013-12-12: 07:00:00 via INTRAVENOUS

## 2013-12-12 MED ORDER — ROCURONIUM BROMIDE 100 MG/10ML IV SOLN
INTRAVENOUS | Status: DC | PRN
  Administered 2013-12-12: 50 mg via INTRAVENOUS

## 2013-12-12 MED ORDER — OXYCODONE HCL 5 MG PO TABS: 5.0000 mg | ORAL_TABLET | Freq: Once | ORAL | Status: DC | PRN

## 2013-12-12 MED ORDER — PROPOFOL 10 MG/ML IV BOLUS
INTRAVENOUS | Status: DC | PRN
  Administered 2013-12-12: 120 mg via INTRAVENOUS

## 2013-12-12 MED ORDER — LIDOCAINE HCL (CARDIAC) 20 MG/ML IV SOLN
INTRAVENOUS | Status: DC | PRN
  Administered 2013-12-12: 30 mg via INTRAVENOUS

## 2013-12-12 MED ORDER — POTASSIUM CHLORIDE CRYS ER 20 MEQ PO TBCR
20.0000 meq | EXTENDED_RELEASE_TABLET | Freq: Two times a day (BID) | ORAL | Status: DC
  Administered 2013-12-12 – 2013-12-13 (×3): 20 meq via ORAL
  Filled 2013-12-12 (×5): qty 1

## 2013-12-12 MED ORDER — LIDOCAINE HCL (CARDIAC) 20 MG/ML IV SOLN
INTRAVENOUS | Status: AC
  Filled 2013-12-12: qty 5

## 2013-12-12 MED ORDER — FUROSEMIDE 40 MG PO TABS
40.0000 mg | ORAL_TABLET | Freq: Every day | ORAL | Status: DC
  Administered 2013-12-12: 40 mg via ORAL
  Filled 2013-12-12 (×2): qty 1

## 2013-12-12 MED ORDER — PHENYLEPHRINE 40 MCG/ML (10ML) SYRINGE FOR IV PUSH (FOR BLOOD PRESSURE SUPPORT)
PREFILLED_SYRINGE | INTRAVENOUS | Status: AC
  Filled 2013-12-12: qty 10

## 2013-12-12 MED ORDER — RIFAMPIN POWD
600.0000 mg | Status: DC
  Filled 2013-12-12: qty 1

## 2013-12-12 MED ORDER — HYDRALAZINE HCL 25 MG PO TABS
12.5000 mg | ORAL_TABLET | Freq: Three times a day (TID) | ORAL | Status: DC
  Administered 2013-12-12 (×2): 12.5 mg via ORAL
  Filled 2013-12-12 (×5): qty 0.5

## 2013-12-12 MED ORDER — HYDRALAZINE HCL 20 MG/ML IJ SOLN: 10.0000 mg | INTRAMUSCULAR | Status: DC | PRN

## 2013-12-12 MED ORDER — MIDAZOLAM HCL 2 MG/2ML IJ SOLN
INTRAMUSCULAR | Status: AC
  Filled 2013-12-12: qty 2

## 2013-12-12 MED ORDER — LABETALOL HCL 5 MG/ML IV SOLN: 10.0000 mg | INTRAVENOUS | Status: DC | PRN

## 2013-12-12 MED ORDER — NIACIN ER 500 MG PO TBCR
500.0000 mg | EXTENDED_RELEASE_TABLET | Freq: Two times a day (BID) | ORAL | Status: DC
  Administered 2013-12-12: 500 mg via ORAL
  Filled 2013-12-12 (×4): qty 1

## 2013-12-12 MED ORDER — HEPARIN SODIUM (PORCINE) 1000 UNIT/ML IJ SOLN
INTRAMUSCULAR | Status: AC
Start: 2013-12-12 — End: 2013-12-12
  Filled 2013-12-12: qty 1

## 2013-12-12 MED ORDER — SODIUM CHLORIDE 0.9 % IV SOLN
INTRAVENOUS | Status: DC
Start: 2013-12-12 — End: 2013-12-12

## 2013-12-12 MED ORDER — ALUM & MAG HYDROXIDE-SIMETH 200-200-20 MG/5ML PO SUSP: 15.0000 mL | ORAL | Status: DC | PRN

## 2013-12-12 MED ORDER — METOPROLOL SUCCINATE ER 50 MG PO TB24
50.0000 mg | ORAL_TABLET | Freq: Every day | ORAL | Status: DC
  Filled 2013-12-12: qty 1

## 2013-12-12 MED ORDER — LOSARTAN POTASSIUM 25 MG PO TABS
25.0000 mg | ORAL_TABLET | Freq: Every day | ORAL | Status: DC
  Administered 2013-12-12: 25 mg via ORAL
  Filled 2013-12-12 (×2): qty 1

## 2013-12-12 MED ORDER — PROTAMINE SULFATE 10 MG/ML IV SOLN
INTRAVENOUS | Status: DC | PRN
  Administered 2013-12-12: 50 mg via INTRAVENOUS

## 2013-12-12 MED ORDER — IPRATROPIUM-ALBUTEROL 0.5-2.5 (3) MG/3ML IN SOLN: 3.0000 mL | Freq: Four times a day (QID) | RESPIRATORY_TRACT | Status: DC | PRN

## 2013-12-12 MED ORDER — GUAIFENESIN-DM 100-10 MG/5ML PO SYRP: 15.0000 mL | ORAL_SOLUTION | ORAL | Status: DC | PRN

## 2013-12-12 MED ORDER — DOPAMINE-DEXTROSE 3.2-5 MG/ML-% IV SOLN: 3.0000 ug/kg/min | INTRAVENOUS | Status: DC

## 2013-12-12 MED ORDER — ACETAMINOPHEN 325 MG PO TABS: 325.0000 mg | ORAL_TABLET | ORAL | Status: DC | PRN

## 2013-12-12 MED ORDER — SODIUM CHLORIDE 0.9 % IV SOLN
600.0000 mg | INTRAVENOUS | Status: DC
  Filled 2013-12-12: qty 600

## 2013-12-12 MED ORDER — FENTANYL CITRATE 0.05 MG/ML IJ SOLN
INTRAMUSCULAR | Status: DC | PRN
  Administered 2013-12-12: 250 ug via INTRAVENOUS

## 2013-12-12 MED ORDER — PANTOPRAZOLE SODIUM 40 MG PO TBEC: 40.0000 mg | DELAYED_RELEASE_TABLET | Freq: Every day | ORAL | Status: DC

## 2013-12-12 MED ORDER — PROMETHAZINE HCL 25 MG/ML IJ SOLN: 6.2500 mg | INTRAMUSCULAR | Status: DC | PRN

## 2013-12-12 MED ORDER — FENTANYL CITRATE 0.05 MG/ML IJ SOLN: 25.0000 ug | INTRAMUSCULAR | Status: DC | PRN

## 2013-12-12 MED ORDER — ISOSORBIDE MONONITRATE ER 60 MG PO TB24
120.0000 mg | ORAL_TABLET | Freq: Every day | ORAL | Status: DC
  Filled 2013-12-12: qty 2

## 2013-12-12 MED ORDER — IPRATROPIUM-ALBUTEROL 18-103 MCG/ACT IN AERO: 2.0000 | INHALATION_SPRAY | Freq: Four times a day (QID) | RESPIRATORY_TRACT | Status: DC | PRN

## 2013-12-12 MED ORDER — ATORVASTATIN CALCIUM 10 MG PO TABS
10.0000 mg | ORAL_TABLET | Freq: Every day | ORAL | Status: DC
  Administered 2013-12-12: 10 mg via ORAL
  Filled 2013-12-12 (×2): qty 1

## 2013-12-12 MED ORDER — EPHEDRINE SULFATE 50 MG/ML IJ SOLN
INTRAMUSCULAR | Status: AC
  Filled 2013-12-12: qty 1

## 2013-12-12 MED ORDER — MAGNESIUM SULFATE 40 MG/ML IJ SOLN: 2.0000 g | Freq: Every day | INTRAMUSCULAR | Status: DC | PRN

## 2013-12-12 MED ORDER — SODIUM CHLORIDE 0.9 % IV SOLN: 500.0000 mL | Freq: Once | INTRAVENOUS | Status: AC | PRN

## 2013-12-12 MED ORDER — OXYCODONE-ACETAMINOPHEN 5-325 MG PO TABS
1.0000 | ORAL_TABLET | ORAL | Status: DC | PRN
  Administered 2013-12-13: 1 via ORAL
  Filled 2013-12-12: qty 1

## 2013-12-12 MED ORDER — FENTANYL CITRATE 0.05 MG/ML IJ SOLN
INTRAMUSCULAR | Status: AC
  Filled 2013-12-12: qty 5

## 2013-12-12 MED ORDER — ALLOPURINOL 100 MG PO TABS
100.0000 mg | ORAL_TABLET | Freq: Every day | ORAL | Status: DC
  Administered 2013-12-12: 100 mg via ORAL
  Filled 2013-12-12 (×2): qty 1

## 2013-12-12 MED ORDER — MORPHINE SULFATE 2 MG/ML IJ SOLN
2.0000 mg | INTRAMUSCULAR | Status: DC | PRN
  Administered 2013-12-12: 4 mg via INTRAVENOUS
  Filled 2013-12-12: qty 2

## 2013-12-12 MED ORDER — DEXTROSE 5 % IV SOLN
1.5000 g | Freq: Two times a day (BID) | INTRAVENOUS | Status: AC
  Administered 2013-12-12 – 2013-12-13 (×2): 1.5 g via INTRAVENOUS
  Filled 2013-12-12 (×2): qty 1.5

## 2013-12-12 MED ORDER — 0.9 % SODIUM CHLORIDE (POUR BTL) OPTIME
TOPICAL | Status: DC | PRN
  Administered 2013-12-12: 2000 mL

## 2013-12-12 MED ORDER — SUCCINYLCHOLINE CHLORIDE 20 MG/ML IJ SOLN
INTRAMUSCULAR | Status: AC
  Filled 2013-12-12: qty 1

## 2013-12-12 MED ORDER — BISACODYL 10 MG RE SUPP: 10.0000 mg | Freq: Every day | RECTAL | Status: DC | PRN

## 2013-12-12 MED ORDER — STERILE WATER FOR INJECTION IJ SOLN
INTRAMUSCULAR | Status: AC
  Filled 2013-12-12: qty 10

## 2013-12-12 MED ORDER — ASPIRIN EC 81 MG PO TBEC
81.0000 mg | DELAYED_RELEASE_TABLET | Freq: Every day | ORAL | Status: DC
  Administered 2013-12-12: 81 mg via ORAL
  Filled 2013-12-12 (×2): qty 1

## 2013-12-12 MED ORDER — NITROGLYCERIN 0.4 MG SL SUBL: 0.4000 mg | SUBLINGUAL_TABLET | SUBLINGUAL | Status: DC | PRN

## 2013-12-12 MED ORDER — POTASSIUM CHLORIDE CRYS ER 20 MEQ PO TBCR: 20.0000 meq | EXTENDED_RELEASE_TABLET | Freq: Every day | ORAL | Status: DC | PRN

## 2013-12-12 MED ORDER — OXYCODONE HCL 5 MG/5ML PO SOLN: 5.0000 mg | Freq: Once | ORAL | Status: DC | PRN

## 2013-12-12 MED ORDER — SODIUM CHLORIDE 0.9 % IV SOLN
INTRAVENOUS | Status: DC
  Administered 2013-12-12: 12:00:00 via INTRAVENOUS

## 2013-12-12 MED ORDER — SODIUM CHLORIDE 0.9 % IR SOLN
Status: DC | PRN
  Administered 2013-12-12: 09:00:00

## 2013-12-12 MED ORDER — OXYCODONE-ACETAMINOPHEN 5-325 MG PO TABS: 1.0000 | ORAL_TABLET | Freq: Four times a day (QID) | ORAL | Status: DC | PRN

## 2013-12-12 MED ORDER — PHENYLEPHRINE HCL 10 MG/ML IJ SOLN
INTRAMUSCULAR | Status: DC | PRN
Start: 2013-12-12 — End: 2013-12-12
  Administered 2013-12-12: 80 ug via INTRAVENOUS

## 2013-12-12 MED ORDER — MEPERIDINE HCL 25 MG/ML IJ SOLN: 6.2500 mg | INTRAMUSCULAR | Status: DC | PRN

## 2013-12-12 MED ORDER — PHENOL 1.4 % MT LIQD: 1.0000 | OROMUCOSAL | Status: DC | PRN

## 2013-12-12 SURGICAL SUPPLY — 51 items
APL SKNCLS STERI-STRIP NONHPOA (GAUZE/BANDAGES/DRESSINGS) ×4
BENZOIN TINCTURE PRP APPL 2/3 (GAUZE/BANDAGES/DRESSINGS) ×6 IMPLANT
CANISTER SUCTION 2500CC (MISCELLANEOUS) ×4 IMPLANT
CANNULA VESSEL 3MM 2 BLNT TIP (CANNULA) ×4 IMPLANT
CLIP LIGATING EXTRA MED SLVR (CLIP) ×4 IMPLANT
CLIP LIGATING EXTRA SM BLUE (MISCELLANEOUS) ×4 IMPLANT
CLOSURE STERI-STRIP 1/2X4 (GAUZE/BANDAGES/DRESSINGS) ×2
CLOSURE WOUND 1/2 X4 (GAUZE/BANDAGES/DRESSINGS) ×1
CLSR STERI-STRIP ANTIMIC 1/2X4 (GAUZE/BANDAGES/DRESSINGS) ×2 IMPLANT
CONT SPECI 4OZ STER CLIK (MISCELLANEOUS) ×2 IMPLANT
COVER SURGICAL LIGHT HANDLE (MISCELLANEOUS) ×4 IMPLANT
DRAIN SNY 10X20 3/4 PERF (WOUND CARE) IMPLANT
DRAPE WARM FLUID 44X44 (DRAPE) ×4 IMPLANT
DRSG COVADERM 4X10 (GAUZE/BANDAGES/DRESSINGS) IMPLANT
ELECT REM PT RETURN 9FT ADLT (ELECTROSURGICAL) ×4
ELECTRODE REM PT RTRN 9FT ADLT (ELECTROSURGICAL) ×2 IMPLANT
EVACUATOR SILICONE 100CC (DRAIN) IMPLANT
GLOVE BIOGEL PI IND STRL 6.5 (GLOVE) IMPLANT
GLOVE BIOGEL PI INDICATOR 6.5 (GLOVE) ×6
GLOVE ECLIPSE 6.0 STRL STRAW (GLOVE) ×2 IMPLANT
GLOVE SS BIOGEL STRL SZ 7.5 (GLOVE) ×2 IMPLANT
GLOVE SUPERSENSE BIOGEL SZ 7.5 (GLOVE) ×2
GLOVE SURG SS PI 7.0 STRL IVOR (GLOVE) ×4 IMPLANT
GOWN STRL REUS W/ TWL LRG LVL3 (GOWN DISPOSABLE) ×6 IMPLANT
GOWN STRL REUS W/ TWL LRG LVL4 (GOWN DISPOSABLE) IMPLANT
GOWN STRL REUS W/TWL LRG LVL3 (GOWN DISPOSABLE) ×12
GOWN STRL REUS W/TWL LRG LVL4 (GOWN DISPOSABLE) ×12 IMPLANT
GRAFT HEMASHIELD 8MM (Vascular Products) ×4 IMPLANT
GRAFT VASC STRG 30X8KNIT (Vascular Products) IMPLANT
KIT BASIN OR (CUSTOM PROCEDURE TRAY) ×4 IMPLANT
KIT ROOM TURNOVER OR (KITS) ×4 IMPLANT
NS IRRIG 1000ML POUR BTL (IV SOLUTION) ×8 IMPLANT
PACK PERIPHERAL VASCULAR (CUSTOM PROCEDURE TRAY) ×4 IMPLANT
PAD ARMBOARD 7.5X6 YLW CONV (MISCELLANEOUS) ×8 IMPLANT
SPONGE GAUZE 4X4 12PLY (GAUZE/BANDAGES/DRESSINGS) ×4 IMPLANT
STAPLER VISISTAT 35W (STAPLE) IMPLANT
STRIP CLOSURE SKIN 1/2X4 (GAUZE/BANDAGES/DRESSINGS) ×3 IMPLANT
SUT ETHILON 3 0 PS 1 (SUTURE) IMPLANT
SUT PROLENE 5 0 C 1 24 (SUTURE) ×8 IMPLANT
SUT PROLENE 5 0 C 1 36 (SUTURE) ×2 IMPLANT
SUT PROLENE 6 0 CC (SUTURE) ×2 IMPLANT
SUT SILK 2 0 SH (SUTURE) IMPLANT
SUT VIC AB 2-0 CTX 36 (SUTURE) ×8 IMPLANT
SUT VIC AB 3-0 SH 27 (SUTURE) ×12
SUT VIC AB 3-0 SH 27X BRD (SUTURE) ×4 IMPLANT
TAPE CLOTH SURG 4X10 WHT LF (GAUZE/BANDAGES/DRESSINGS) ×4 IMPLANT
TOWEL OR 17X24 6PK STRL BLUE (TOWEL DISPOSABLE) ×8 IMPLANT
TOWEL OR 17X26 10 PK STRL BLUE (TOWEL DISPOSABLE) ×4 IMPLANT
TRAY FOLEY CATH 16FRSI W/METER (SET/KITS/TRAYS/PACK) ×4 IMPLANT
UNDERPAD 30X30 INCONTINENT (UNDERPADS AND DIAPERS) ×4 IMPLANT
WATER STERILE IRR 1000ML POUR (IV SOLUTION) ×4 IMPLANT

## 2013-12-12 NOTE — Interval H&P Note (Signed)
History and Physical Interval Note:  12/12/2013 7:20 AM  Stephen Cohen  has presented today for surgery, with the diagnosis of PAD  The various methods of treatment have been discussed with the patient and family. After consideration of risks, benefits and other options for treatment, the patient has consented to  Procedure(s): BYPASS GRAFT FEMORAL-FEMORAL ARTERY-  RIGHT TO LEFT (N/A) as a surgical intervention .  The patient's history has been reviewed, patient examined, no change in status, stable for surgery.  I have reviewed the patient's chart and labs.  Questions were answered to the patient's satisfaction.     Kolbe Delmonaco

## 2013-12-12 NOTE — H&P (View-Only) (Signed)
Patient name: Stephen Cohen MRN: [id number] DOB: September 21, 1941 Sex: male   Referred by: Clovis RileyMitchell  Reason for referral:  Chief Complaint  Patient presents with  . Re-evaluation    3 month f/u     HISTORY OF PRESENT ILLNESS:  Patient has today for continued discussion regarding his left leg claud femoral-femoral bypasses. This initially wasication. He is well known to me from multiple prior surgeries. He initially had fem-fem bypass 20 years ago. He has had several revisions and I said replacement of a portion where he arrived in the midportion of his fem-fem graft. She evidence of infection. Clinically he occluded his fem-fem graft in June. I saw in 3 months ago he was having limiting claudication that time but was unable to take time for surgery. He has had progressive symptoms. Fortunately has had no tissue loss or rest pain. He reports to leg claudication with walking a short distance and this is severe diffuse any activity such as moving his trashcan. He wishes to proceed with surgery. He has remained stable from a cardiac standpoint status post stent placement one year ago.  Past Medical History  Diagnosis Date  . Hypertension   . Hyperlipidemia   . Myocardial infarction 1995  . COPD (chronic obstructive pulmonary disease)   . GERD (gastroesophageal reflux disease)   . Hiatal hernia   . Peripheral vascular disease   . Arthritis   . Peptic ulcer disease   . Coronary artery disease   . Anginal pain   . Shortness of breath     Past Surgical History  Procedure Laterality Date  . Pr vein bypass graft,aorto-fem-pop  1995  2005    Right to left Fem-Fem with revision   . Appendectomy    . Coronary artery bypass graft  1995  . Left heart cath Left 11-11-12  . Pace maker  February 01, 2013    History   Social History  . Marital Status: Married    Spouse Name: N/A    Number of Children: N/A  . Years of Education: N/A   Occupational History  . Not on file.   Social History  Main Topics  . Smoking status: Current Every Day Smoker -- 1.00 packs/day for 65 years    Types: Cigarettes  . Smokeless tobacco: Never Used     Comment: pt states that he is not going to quit  . Alcohol Use: No  . Drug Use: No  . Sexual Activity: Not on file   Other Topics Concern  . Not on file   Social History Narrative  . No narrative on file    Family History  Problem Relation Age of Onset  . Heart disease Mother   . Hyperlipidemia Mother   . Heart attack Mother   . Heart disease Father   . Hyperlipidemia Father   . Cancer Sister   . Heart disease Sister   . Hyperlipidemia Sister   . Heart attack Sister   . Peripheral vascular disease Sister     Allergies as of 12/06/2013  . (No Known Allergies)    Current Outpatient Prescriptions on File Prior to Visit  Medication Sig Dispense Refill  . albuterol-ipratropium (COMBIVENT) 18-103 MCG/ACT inhaler Inhale 2 puffs into the lungs every 6 (six) hours as needed. For shortness of breath      . allopurinol (ZYLOPRIM) 100 MG tablet daily. For gout      . amLODipine (NORVASC) 10 MG tablet Take 10 mg by mouth daily.  Halt Tab per day      . atorvastatin (LIPITOR) 20 MG tablet Take 10 mg by mouth daily.       Marland Kitchen esomeprazole (NEXIUM) 40 MG capsule Take 40 mg by mouth daily before breakfast.      . furosemide (LASIX) 40 MG tablet Take 40 mg by mouth daily.      . hydrALAZINE (APRESOLINE) 25 MG tablet Take 25 mg by mouth 3 (three) times daily. 1/2 tablet tid. Cutting down on medication due to heart rate per patient      . isosorbide mononitrate (IMDUR) 120 MG 24 hr tablet Take 120 mg by mouth daily.      Marland Kitchen losartan (COZAAR) 25 MG tablet Take 1 tablet (25 mg total) by mouth daily.  30 tablet  0  . metoprolol succinate (TOPROL-XL) 25 MG 24 hr tablet Take 50 mg by mouth daily.      . niacin (SLO-NIACIN) 500 MG tablet Take 500 mg by mouth 2 (two) times daily with a meal.      . nitroGLYCERIN (NITROSTAT) 0.4 MG SL tablet Place 0.4 mg  under the tongue every 5 (five) minutes as needed. For chest pain      . potassium chloride SA (K-DUR,KLOR-CON) 20 MEQ tablet Take 20 mEq by mouth 2 (two) times daily.      . prasugrel (EFFIENT) 10 MG TABS Take 1 tablet (10 mg total) by mouth daily.  30 tablet  0   No current facility-administered medications on file prior to visit.       PHYSICAL EXAMINATION:  General: The patient is a well-nourished male, in no acute distress. Vital signs are BP 153/68  Pulse 50  Ht 5\' 11"  (1.803 m)  Wt 167 lb (75.751 kg)  BMI 23.30 kg/m2  SpO2 99% Pulmonary: There is a good air exchange   Musculoskeletal: There are no major deformities.  There is no significant extremity pain. Neurologic: No focal weakness or paresthesias are detected, Skin: There are no ulcer or rashes noted. Psychiatric: The patient has normal affect. Cardiovascular: 2+ right femoral and right posterior tibial pulse, absent left femoral and left pedal pulses His groin incisions showed no evidence of infection. There is question of fluid around the graft in the mid valley with no tenderness over this area.   [facility] Vascular Lab Studies:  Ordered and Independently Reviewed studies from October 2014 revealed occlusion of his graft with maximum index of 0.5 on the left and normal on the right  Impression and Plan:  Limiting claudication entire left leg related to occlusion of his right to left fem-fem bypass which was initially placed in 1995. We will obtain a CT angiogram for evaluation of his runoff status on the left. We will plan redo right to left fem-fem bypass on 12/12/2013.    Jaeley Wiker [facility] Office: [phone]

## 2013-12-12 NOTE — Progress Notes (Signed)
MEDICATION RELATED CONSULT NOTE - INITIAL   Pharmacy consulted for renal antibiotic adjustment. 72yom s/p vascular procedure to receive Zinacef for post surgical prophylaxis. Wt 80kg, CrCl 64 ml/min,NKA. Patient has Zinacef 1.5g IV q12h x 2 doses ordered - dosing appropriate. Pharmacy will sign off. Please reconsult if additional assistance is needed. Thanks!!  Cleon Dewulaney, [place], PharmD [phone] 12/12/2013

## 2013-12-12 NOTE — Transfer of Care (Signed)
Immediate Anesthesia Transfer of Care Note  Patient: Stephen Cohen  Procedure(s) Performed: Procedure(s): BYPASS GRAFT FEMORAL-FEMORAL ARTERY-  RIGHT TO LEFT using rifampin soaked hemashield graft (N/A) REMOVAL OF Femoral- femoral hemashield GRAFT (Bilateral)  Patient Location: PACU  Anesthesia Type:General  Level of Consciousness: awake, alert  and oriented  Airway & Oxygen Therapy: Patient Spontanous Breathing  Post-op Assessment: Report given to PACU RN and Post -op Vital signs reviewed and stable  Post vital signs: Reviewed and stable  Complications: No apparent anesthesia complications

## 2013-12-12 NOTE — OR Nursing (Signed)
Rifampin 600mg  was used to soak graft prior to insertion, per order by Dr. Arbie CookeyEarly.  Rifampin soaking protocol followed.

## 2013-12-12 NOTE — Preoperative (Signed)
Beta Blockers   Reason not to administer Beta Blockers:Not Applicable 

## 2013-12-12 NOTE — Anesthesia Procedure Notes (Signed)
Procedure Name: Intubation Date/Time: 12/12/2013 7:40 AM Performed by: Whitman HeroVITSHTEYN, Kristoff Coonradt Pre-anesthesia Checklist: Patient identified, Emergency Drugs available, Suction available and Patient being monitored Patient Re-evaluated:Patient Re-evaluated prior to inductionOxygen Delivery Method: Circle system utilized Preoxygenation: Pre-oxygenation with 100% oxygen Intubation Type: IV induction Ventilation: Mask ventilation without difficulty Laryngoscope Size: Mac and 3 Grade View: Grade I Tube type: Oral Tube size: 8.0 mm Number of attempts: 1 Airway Equipment and Method: Stylet Placement Confirmation: ETT inserted through vocal cords under direct vision,  positive ETCO2 and breath sounds checked- equal and bilateral Secured at: 22 cm Tube secured with: Tape Dental Injury: Teeth and Oropharynx as per pre-operative assessment

## 2013-12-12 NOTE — Evaluation (Signed)
Physical Therapy Evaluation Patient Details Name: Stephen FusBilly R Viswanathan MRN: [id number] DOB: 06/09/1941 Today's Date: 12/12/2013 Time: [id number] PT Time Calculation (min): 20 min  PT Assessment / Plan / Recommendation History of Present Illness  Pt s/p Redo right to left fem-fem bypass with 8 mm Hemashield graft  Clinical Impression  Pt set in his ways but with know decreased activity tolerance. Pt SpO2 85-89% on RA during ambulation but refused O2. Pt slightly impulsive but ambulated without antalgic gait and denied pain at this time. Pt safe to d/c home when medically stable.     PT Assessment  Patient needs continued PT services    Follow Up Recommendations  No PT follow up;Supervision - Intermittent    Does the patient have the potential to tolerate intense rehabilitation      Barriers to Discharge        Equipment Recommendations  None recommended by PT    Recommendations for Other Services     Frequency Min 3X/week    Precautions / Restrictions Precautions Precautions: Fall Precaution Comments: watch O2 sats Restrictions Weight Bearing Restrictions: No   Pertinent Vitals/Pain Denies pain      Mobility  Bed Mobility Overal bed mobility: Modified Independent General bed mobility comments: safe technique Transfers Overall transfer level: Needs assistance Equipment used: Rolling walker (2 wheeled) Transfers: Sit to/from Stand Sit to Stand: Supervision General transfer comment: pt slightly impulsive Ambulation/Gait Ambulation/Gait assistance: Supervision Ambulation Distance (Feet): 300 Feet Assistive device: Rolling walker (2 wheeled) Gait Pattern/deviations: Step-through pattern Gait velocity: slow General Gait Details: pt SpO2 85-89% on RA during ambulation, v/c's to decrease speed    Exercises General Exercises - Lower Extremity Ankle Circles/Pumps: AROM;10 reps (instructed to do freq when sitting with LEs down)   PT Diagnosis: Difficulty walking  PT Problem  List: Decreased activity tolerance;Cardiopulmonary status limiting activity PT Treatment Interventions: DME instruction;Gait training;Functional mobility training;Therapeutic activities;Therapeutic exercise     PT Goals(Current goals can be found in the care plan section) Acute Rehab PT Goals Patient Stated Goal: home asap PT Goal Formulation: With patient Time For Goal Achievement: 12/19/13 Potential to Achieve Goals: Good  Visit Information  Last PT Received On: 12/12/13 Assistance Needed: +1 History of Present Illness: Pt s/p Redo right to left fem-fem bypass with 8 mm Hemashield graft       Prior Functioning  Home Living Family/patient expects to be discharged to:: Private residence Living Arrangements: Spouse/significant other Available Help at Discharge: Family;Available 24 hours/day (however wife not very helpful) Type of Home: House Home Access:  (1 threshhold step) Home Layout: One level Home Equipment: Walker - 2 wheels;Walker - 4 wheels Prior Function Level of Independence: Independent 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 Dominant Hand: Right    Cognition  Cognition Arousal/Alertness: Awake/alert Behavior During Therapy: WFL for tasks assessed/performed Overall Cognitive Status: Within Functional Limits for tasks assessed    Extremity/Trunk Assessment Upper Extremity Assessment Upper Extremity Assessment: Overall WFL for tasks assessed Lower Extremity Assessment Lower Extremity Assessment: Overall WFL for tasks assessed Cervical / Trunk Assessment Cervical / Trunk Assessment: Normal   Balance    End of Session PT - End of Session Equipment Utilized During Treatment: Gait belt  GP     Marcene BrawnChadwell, Wylie Russon Marie 12/12/2013, 5:04 PM  Lewis ShockAshly Uchechi [place], PT, DPT Pager #: [phone] Office #: [phone]

## 2013-12-12 NOTE — Anesthesia Postprocedure Evaluation (Signed)
  Anesthesia Post-op Note  Patient: Stephen Cohen  Procedure(s) Performed: Procedure(s): BYPASS GRAFT FEMORAL-FEMORAL ARTERY-  RIGHT TO LEFT using rifampin soaked hemashield graft (N/A) REMOVAL OF Femoral- femoral hemashield GRAFT (Bilateral)  Patient Location: PACU  Anesthesia Type:General  Level of Consciousness: awake, alert , oriented and patient cooperative  Airway and Oxygen Therapy: Patient Spontanous Breathing and Patient connected to nasal cannula oxygen  Post-op Pain: none  Post-op Assessment: Post-op Vital signs reviewed, Patient's Cardiovascular Status Stable, Respiratory Function Stable, Patent Airway, No signs of Nausea or vomiting and Pain level controlled  Post-op Vital Signs: Reviewed and stable  Complications: No apparent anesthesia complications

## 2013-12-12 NOTE — Op Note (Signed)
OPERATIVE REPORT  DATE OF SURGERY: 12/12/2013  PATIENT: Stephen Cohen, 73 y.o. male MRN: [id number]  DOB: 10-23-1941  PRE-OPERATIVE DIAGNOSIS: Limiting claudication left leg with occluded right to left fem-fem bypass,  Fluid collection  POST-OPERATIVE DIAGNOSIS:  Same  PROCEDURE: Redo right to left fem-fem bypass with 8 mm Hemashield graft  SURGEON:  [name], M.D.  PHYSICIAN ASSISTANT: Thomasena Edisollins PA  ANESTHESIA:  Gen.  EBL: Less than 100 ml  Total I/O In: 100 [P.O.:100] Out: 150 [Urine:150]  BLOOD ADMINISTERED: None  DRAINS: None  SPECIMEN: Gram stain CNS of perigraft fluid  COUNTS CORRECT:  YES  PLAN OF CARE: PACU   PATIENT DISPOSITION:  PACU - hemodynamically stable  PROCEDURE DETAILS: The patient is a 73 year old gentleman who is 20 years status post right to left fem-fem bypass for left leg claudication. He had several revisions of this graft and the 20 years. 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 At that time he had replacement with Rifampin soaked graft. By history he had occlusion of his graft in June 2014. He had progressive claudication and is now to the point where he cannot tolerate this. Physical exam he does have fluid at the mid to left side of the graft. He did undergo CTA for planning of redo surgery. This confirmed fluid around the graft. He has no erythema no fevers and no evidence of infection.  Procedure in detail: The patient was placed supine position where the area of both groins are prepped in the sterile fashion. Incision was made over the left groin to the prior scar and carried down to isolate the left limb of the graft. There was a thickening around the graft. The superficial femoral and profundus femoris arteries were isolated and controlled with vessel loops. The external iliac was also controlled at the exit under the inguinal ligament. Dissection  was continued medially towards the midline at the level of the graft. The fluid around the graft was entered. This was cloudy yellow fluid. There was no odor. This was sent for stat Gram stain with a CNS pending. Gram stain showed no organisms with a white cells only. Incision was made through the prior right groin incision and carried down to isolate the right limb of the graft. The external iliac artery had a good pulse. The superficial femoral and profundus femoris arteries were patent were also controlled with vessel loops. The graft was transected near the left femoral anastomosis. The graft was also transected away from the right femoral anastomosis. The entirety of the graft that had been placed before was removed. Also the rind that was around the graft was removed. This was copiously irrigated with saline. The patient was given 7000 of intravenous heparin after adequate circulation time the right external iliac superficial femoral and function was arteries were occluded. The old hood of the graft was removed. The complete graft was removed from the prior graft. The artery itself wasn't very good shape with no evidence of degeneration. A new Hemashield graft was brought onto the field and was also soaked inRifampin per protocol. The graft was spatulated and sewn end-to-side to the right femoral artery at the junction of the common femoral and superficial femoral artery. The anastomosis was tested and found to be adequate. This was with a 5-0 Prolene suture. Clamps were removed and the graft itself. The old tunnel was chosen to  place a graft. There was consideration of placing this lower than that but it would be low in the pelvis and placing it in a new tunnel above the old graft would be up near the umbilicus with the difficulty with the way the graft would lie. Since the Gram stain was negative decision was made to place this through the old tunnel. The left external iliac superficial femoral function was  arteries were occluded. The left limb of the graft was removed in its entirety. It appeared that there had been some slight separation of this with a false aneurysm present. The artery itself was abraded back to good tissue in this was without any evidence of aneurysmal change. The left limb of the graft was cut to appropriate length and spatulated and sewn end-to-side to the junction of the common and superficial femoral on the left with a running 5-0 Prolene suture. Prior to completion of the anastomosis the usual flushing maneuvers were undertaken. Anastomosis was completed the patient was given 50 mg of protamine to reverse the heparin area wounds are irrigated with saline. The wounds were closed with several layers of 2-0 Vicryl in the subcutaneous tissue. The skin was closed with 3-0 subcuticular Vicryl stitch. Sterile dressing was applied and the patient was taken to the recovery room in stable condition   [name], M.D. 12/12/2013 3:42 PM

## 2013-12-12 NOTE — Anesthesia Preprocedure Evaluation (Signed)
Anesthesia Evaluation  Patient identified by MRN, date of birth, ID band Patient awake    Reviewed: Allergy & Precautions, H&P , NPO status , Patient's Chart, lab work & pertinent test results  History of Anesthesia Complications Negative for: history of anesthetic complications  Airway Mallampati: II TM Distance: >3 FB Neck ROM: Full    Dental  (+) Edentulous Upper and Edentulous Lower   Pulmonary shortness of breath, COPDCurrent Smoker,  breath sounds clear to auscultation        Cardiovascular hypertension, + angina at rest + CAD, + Past MI, + Cardiac Stents, + CABG, + Peripheral Vascular Disease and +CHF + pacemaker + Cardiac Defibrillator (has never shocked pt, cards recommends magnet intra-op) Rhythm:Regular Rate:Normal  2/14 ECHO: EF 30%, valves OK   Neuro/Psych negative neurological ROS     GI/Hepatic Neg liver ROS, hiatal hernia, GERD-  Medicated and Controlled,  Endo/Other    Renal/GU      Musculoskeletal   Abdominal   Peds  Hematology negative hematology ROS (+)   Anesthesia Other Findings   Reproductive/Obstetrics                           Anesthesia Physical Anesthesia Plan  ASA: IV  Anesthesia Plan: General   Post-op Pain Management:    Induction: Intravenous  Airway Management Planned: Oral ETT  Additional Equipment:   Intra-op Plan:   Post-operative Plan: Extubation in OR  Informed Consent: I have reviewed the patients History and Physical, chart, labs and discussed the procedure including the risks, benefits and alternatives for the proposed anesthesia with the patient or authorized representative who has indicated his/her understanding and acceptance.     Plan Discussed with: CRNA and Surgeon  Anesthesia Plan Comments: (Plan routine monitors, GETA, possible A-line)        Anesthesia Quick Evaluation

## 2013-12-12 NOTE — Progress Notes (Signed)
Utilization review completed.  

## 2013-12-13 ENCOUNTER — Encounter (HOSPITAL_COMMUNITY): Payer: Self-pay | Admitting: Vascular Surgery

## 2013-12-13 ENCOUNTER — Telehealth: Payer: Self-pay | Admitting: Vascular Surgery

## 2013-12-13 ENCOUNTER — Other Ambulatory Visit: Payer: Self-pay | Admitting: *Deleted

## 2013-12-13 DIAGNOSIS — I739 Peripheral vascular disease, unspecified: Secondary | ICD-10-CM

## 2013-12-13 DIAGNOSIS — Z48812 Encounter for surgical aftercare following surgery on the circulatory system: Secondary | ICD-10-CM

## 2013-12-13 LAB — BASIC METABOLIC PANEL
BUN: 11 mg/dL (ref 6–23)
CALCIUM: 8.3 mg/dL — AB (ref 8.4–10.5)
CO2: 21 mEq/L (ref 19–32)
Chloride: 102 mEq/L (ref 96–112)
Creatinine, Ser: 1.2 mg/dL (ref 0.50–1.35)
GFR calc Af Amer: 68 mL/min — ABNORMAL LOW (ref >90)
GFR, EST NON AFRICAN AMERICAN: 59 mL/min — AB (ref >90)
Glucose, Bld: 112 mg/dL — ABNORMAL HIGH (ref 70–99)
POTASSIUM: 4 meq/L (ref 3.7–5.3)
SODIUM: 138 meq/L (ref 137–147)

## 2013-12-13 LAB — CBC
HCT: 38.7 % — ABNORMAL LOW (ref 39.0–52.0)
Hemoglobin: 12.9 g/dL — ABNORMAL LOW (ref 13.0–17.0)
MCH: 29.8 pg (ref 26.0–34.0)
MCHC: 33.3 g/dL (ref 30.0–36.0)
MCV: 89.4 fL (ref 78.0–100.0)
PLATELETS: 182 10*3/uL (ref 150–400)
RBC: 4.33 MIL/uL (ref 4.22–5.81)
RDW: 13.5 % (ref 11.5–15.5)
WBC: 14.5 10*3/uL — ABNORMAL HIGH (ref 4.0–10.5)

## 2013-12-13 MED ORDER — OXYCODONE-ACETAMINOPHEN 5-325 MG PO TABS: 1.0000 | ORAL_TABLET | Freq: Four times a day (QID) | ORAL | Status: DC | PRN

## 2013-12-13 NOTE — Telephone Encounter (Addendum)
Message copied by Fredrich BirksMILLIKAN, DANA P on Tue Dec 13, 2013  3:39 PM ------      Message from: Sharee PimpleMCCHESNEY, MARILYN K      Created: Tue Dec 13, 2013  8:09 AM      Regarding: schedule                   ----- Message -----         From: Dara LordsSamantha J Rhyne, PA-C         Sent: 12/13/2013   7:34 AM           To: [facility] Charge Pool            Sent a note yesterday for patient to f/u with Dr. Arbie CookeyEarly in 4 weeks.       Pt will need ABIs at f/u visit.  ------  12/13/13: spoke with patient, dpm

## 2013-12-13 NOTE — Progress Notes (Signed)
Subjective: Interval History: none. Has been up walking without difficulty. Once to go home.   Objective: Vital signs in last 24 hours: Temp:  [97.6 F (36.4 C)-98.7 F (37.1 C)] 98.4 F (36.9 C) (02/02 2335) Pulse Rate:  [52-67] 67 (02/02 2335) Resp:  [12-19] 19 (02/02 2335) BP: (135-146)/(57-69) 141/69 mmHg (02/02 2335) SpO2:  [92 %-96 %] 92 % (02/02 2335) Weight:  [175 lb 11.3 oz (79.7 kg)] 175 lb 11.3 oz (79.7 kg) (02/02 1115)  Intake/Output from previous day: 02/02 0701 - 02/03 0700 In: 2682.1 [P.O.:580; I.V.:2102.1] Out: 1975 [Urine:1975] Intake/Output this shift: Total I/O In: 1490 [P.O.:240; I.V.:1250] Out: 1500 [Urine:1500]  Both groin wounds healing. Dressing removed the Steri-Strips intact. Easily palpable fem-fem graft pulse. 2+ right posterior tibial and 2+ left dorsalis pedis pulse  Lab Results:  Recent Labs  12/12/13 1345 12/13/13 0048  WBC 17.5* 14.5*  HGB 13.8 12.9*  HCT 41.0 38.7*  PLT 219 182   BMET  Recent Labs  12/12/13 1345 12/13/13 0048  NA  --  138  K  --  4.0  CL  --  102  CO2  --  21  GLUCOSE  --  112*  BUN  --  11  CREATININE 1.23 1.20  CALCIUM  --  8.3*    Studies/Results: Ct Angio Ao+bifem W/cm &/or Wo/cm  12/09/2013   CLINICAL DATA:  Peripheral arterial disease.  EXAM: CT ANGIOGRAPHY OF ABDOMINAL AORTA WITH ILIOFEMORAL RUNOFF  TECHNIQUE: Multidetector CT imaging of the abdomen, pelvis and lower extremities was performed using the standard protocol during bolus administration of intravenous contrast. Multiplanar CT image reconstructions including MIPs were obtained to evaluate the vascular anatomy.  CONTRAST:  150mL OMNIPAQUE IOHEXOL 350 MG/ML SOLN  COMPARISON:  CT ABD-PELV WO/W CM dated 05/01/2009  FINDINGS: There is calcified plaque in the aorta with some irregular plaque in the infrarenal aorta. No focal significant stenosis. There is a penetrating ulcer just below the takeoff of the IMA without aneurysmal dilatation.  Celiac is  patent.  Branch vessels are patent.  SMA is patent. Branch vessels are patent. IMA origin is moderately diseased. Branch vessels are patent.  Two right renal arteries and 2 left renal arteries are patent.  Atherosclerotic changes of the right common iliac artery are present without significant narrowing. Right external and and internal iliac arteries are patent.  Diffuse atherosclerotic calcified plaque in the left common iliac artery. The left external iliac artery is occluded. There is significant narrowing at the origin of the left internal iliac artery.  Right femoral to left femoral bypass graft is occluded. Right common femoral, profunda femoral, and superficial femoral artery are patent. Mild calcified plaque in the adductor canal.  Right popliteal artery is patent. Tibial vessels are patent for 3 vessel runoff.  Left common femoral artery reconstitutes. Left profunda femoral artery is patent. Mild calcified disease in the distal left superficial femoral artery but there is no significant focal narrowing. Left popliteal artery is patent. Tibial vessels are patent for 3 vessel runoff.  Bibasilar interstitial lung disease is present characterized by peripheral interlobular septal thickening. This is characteristic for idiopathic pulmonary fibrosis.  Coronary artery bypass grafting surgery with calcified coronary arteries. Very minimal peripheral calcification in the aortic valve. AICD device is in place.  Liver, gallbladder, spleen, pancreas, left adrenal gland are within normal limits. Stable small right adrenal adenoma.  Mild scarring within the right kidney.  Unremarkable left kidney.  Sigmoid diverticulosis without acute diverticulitis.  Left inguinal hernia contains  only adipose tissue  Prostate is enlarged.  No evidence of abnormal retroperitoneal adenopathy.  Lumbar degenerative disc disease. Prominent disc bulge at L3-4 results in right lateral recess narrowing. Mild multifactorial central stenosis at  L4-5. Severe disc space narrowing at L5-S1 with posterior disc osteophytes.  Review of the MIP images confirms the above findings.  IMPRESSION: Left external iliac artery occlusion.  Right to left femoral to femoral bypass graft is occluded.  Otherwise 3 vessel runoff bilaterally.   Electronically Signed   By: Maryclare Bean M.D.   On: 12/09/2013 11:58   Anti-infectives: Anti-infectives   Start     Dose/Rate Route Frequency Ordered Stop   12/12/13 2000  cefUROXime (ZINACEF) 1.5 g in dextrose 5 % 50 mL IVPB     1.5 g 100 mL/hr over 30 Minutes Intravenous Every 12 hours 12/12/13 1135 12/13/13 1959   12/12/13 1000  rifampin (RIFADIN) 600 mg in sodium chloride 0.9 % 100 mL IVPB  Status:  Discontinued     600 mg 200 mL/hr over 30 Minutes Intravenous To Surgery 12/12/13 0948 12/12/13 1124   12/12/13 0815  Rifampin POWD 600 mg  Status:  Discontinued     600 mg Does not apply To Surgery 12/12/13 0801 12/12/13 0948   12/11/13 1311  cefUROXime (ZINACEF) 1.5 g in dextrose 5 % 50 mL IVPB     1.5 g 100 mL/hr over 30 Minutes Intravenous 30 min pre-op 12/11/13 1311 12/12/13 0744      Assessment/Plan: s/p Procedure(s): BYPASS GRAFT FEMORAL-FEMORAL ARTERY-  RIGHT TO LEFT using rifampin soaked hemashield graft (N/A) REMOVAL OF Femoral- femoral hemashield GRAFT (Bilateral) Table postop day one with complications. Discharged home this morning. Followup in 2-3 weeks with ankle arm index at that time   LOS: 1 day   Keven Soucy 12/13/2013, 6:59 AM

## 2013-12-13 NOTE — Discharge Summary (Signed)
[facility] Discharge Summary  Stephen Cohen [birth year] 73 y.o. male  [id number]  Admission Date: 12/12/2013  Discharge Date: 12/13/13  Physician: Larina Earthlyodd F Early, MD  Admission Diagnosis: PAD   HPI:   This is a 73 y.o. male Patient has today for continued discussion regarding his left leg claud femoral-femoral bypasses. This initially wasication. He is well known to me from multiple prior surgeries. He initially had fem-fem bypass 20 years ago. He has had several revisions and I said replacement of a portion where he arrived in the midportion of his fem-fem graft. She evidence of infection. Clinically he occluded his fem-fem graft in June. I saw in 3 months ago he was having limiting claudication that time but was unable to take time for surgery. He has had progressive symptoms. Fortunately has had no tissue loss or rest pain. He reports to leg claudication with walking a short distance and this is severe diffuse any activity such as moving his trashcan. He wishes to proceed with surgery. He has remained stable from a cardiac standpoint status post stent placement one year ago.  Hospital Course:  The patient was admitted to the hospital and taken to the operating room on 12/12/2013 and underwent: Redo right to left fem-fem bypass with 8 mm Hemashield graft    The pt tolerated the procedure well and was transported to the PACU in good condition. By POD 1, he was doing well and was discharge home.  He will have ABI's in the office at his f/u visit.  The remainder of the hospital course consisted of increasing mobilization and increasing intake of solids without difficulty.  CBC    Component Value Date/Time   WBC 14.5* 12/13/2013 0048   RBC 4.33 12/13/2013 0048   HGB 12.9* 12/13/2013 0048   HCT 38.7* 12/13/2013 0048   PLT 182 12/13/2013 0048   MCV 89.4 12/13/2013 0048   MCH 29.8 12/13/2013 0048   MCHC 33.3 12/13/2013 0048   RDW 13.5 12/13/2013 0048   LYMPHSABS 4.2* 11/11/2012 2139   MONOABS 1.0 11/11/2012 2139   EOSABS 0.2 11/11/2012 2139   BASOSABS 0.1 11/11/2012 2139    BMET    Component Value Date/Time   NA 138 12/13/2013 0048   K 4.0 12/13/2013 0048   CL 102 12/13/2013 0048   CO2 21 12/13/2013 0048   GLUCOSE 112* 12/13/2013 0048   BUN 11 12/13/2013 0048   CREATININE 1.20 12/13/2013 0048   CALCIUM 8.3* 12/13/2013 0048   GFRNONAA 59* 12/13/2013 0048   GFRAA 68* 12/13/2013 0048     Discharge Instructions:   The patient is discharged to home with extensive instructions on wound care and progressive ambulation.  They are instructed not to drive or perform any heavy lifting until returning to see the physician in his office.  Discharge Orders   Future Orders Complete By Expires   Call MD for:  redness, tenderness, or signs of infection (pain, swelling, bleeding, redness, odor or green/yellow discharge around incision site)  As directed    Call MD for:  severe or increased pain, loss or decreased feeling  in affected limb(s)  As directed    Call MD for:  temperature >100.5  As directed    Discharge instructions  As directed    Comments:     May shower with soap and water. Dry groin incisions completely. Dry dressing daily if drainage.   Driving Restrictions  As directed    Comments:     No driving  for 2 weeks   Increase activity slowly  As directed    Comments:     Walk with assistance use walker or cane as needed   Lifting restrictions  As directed    Comments:     No lifting for 6 weeks   May shower   As directed    may wash over wound with mild soap and water  As directed    Resume previous diet  As directed       Discharge Diagnosis:  PAD  Secondary Diagnosis: Patient Active Problem List   Diagnosis Date Noted  . PAD (peripheral artery disease) 12/12/2013  . Dizziness and giddiness 08/30/2013  . Swelling of limb-Bilateral leg 08/30/2013  . Pain in limb-Left leg 08/30/2013  . Nonpalpable pulse-Left groin area 08/30/2013  . Unstable angina 11/11/2012  . Systolic  and diastolic CHF, chronic 11/11/2012  . S/P PTCA (percutaneous transluminal coronary angioplasty) 11/11/2012  . Atherosclerosis of native arteries of the extremities with intermittent claudication 08/20/2012  . Aftercare following surgery of the circulatory system, NEC 08/20/2012   Past Medical History  Diagnosis Date  . Hypertension   . Hyperlipidemia   . Myocardial infarction 1995  . COPD (chronic obstructive pulmonary disease)   . GERD (gastroesophageal reflux disease)   . Hiatal hernia   . Peripheral vascular disease   . Arthritis   . Peptic ulcer disease   . Coronary artery disease   . Anginal pain     no pain lately  . Shortness of breath   . Pacemaker   . Automatic implantable cardioverter-defibrillator in situ   . HOH (hard of hearing)   . Ringing in ears        Medication List         albuterol-ipratropium 18-103 MCG/ACT inhaler  Commonly known as:  COMBIVENT  Inhale 2 puffs into the lungs every 6 (six) hours as needed for shortness of breath. For shortness of breath     allopurinol 100 MG tablet  Commonly known as:  ZYLOPRIM  Take 100 mg by mouth daily. For gout     amLODipine 10 MG tablet  Commonly known as:  NORVASC  Take 5 mg by mouth daily. Halt Tab per day     aspirin EC 81 MG tablet  Take 81 mg by mouth daily.     atorvastatin 10 MG tablet  Commonly known as:  LIPITOR  Take 10 mg by mouth daily.     diphenhydrAMINE 25 mg capsule  Commonly known as:  BENADRYL  Take 25 mg by mouth daily as needed (for running nose).     furosemide 40 MG tablet  Commonly known as:  LASIX  Take 40 mg by mouth daily.     hydrALAZINE 25 MG tablet  Commonly known as:  APRESOLINE  Take 12.5 mg by mouth 3 (three) times daily. 1/2 tablet tid. Cutting down on medication due to heart rate per patient     isosorbide mononitrate 120 MG 24 hr tablet  Commonly known as:  IMDUR  Take 120 mg by mouth daily.     losartan 25 MG tablet  Commonly known as:  COZAAR  Take  25 mg by mouth daily.     metoprolol succinate 25 MG 24 hr tablet  Commonly known as:  TOPROL-XL  Take 50 mg by mouth daily.     NEXIUM PO  Take 1 tablet by mouth daily before breakfast.     niacin 500 MG tablet  Commonly known  as:  SLO-NIACIN  Take 500 mg by mouth 2 (two) times daily with a meal.     nitroGLYCERIN 0.4 MG SL tablet  Commonly known as:  NITROSTAT  Place 0.4 mg under the tongue every 5 (five) minutes as needed. For chest pain     oxyCODONE-acetaminophen 5-325 MG per tablet  Commonly known as:  PERCOCET/ROXICET  Take 1 tablet by mouth every 6 (six) hours as needed.     potassium chloride SA 20 MEQ tablet  Commonly known as:  K-DUR,KLOR-CON  Take 20 mEq by mouth 2 (two) times daily.        Roxicet #30 No Refill  Disposition: home  Patient's condition: is Good  Follow up: 1. Dr. Arbie Cookey in 2 weeks   Doreatha Massed, PA-C [facility] [phone] 12/13/2013  7:37 AM  - For VQI Registry use --- Instructions: Press F2 to tab through selections.  Delete question if not applicable.   Post-op:  Wound infection: No  Graft infection: No  Transfusion: No  If yes, n/a units given New Arrhythmia: No Ipsilateral amputation: No, [ ]  Minor, [ ]  BKA, [ ]  AKA Discharge patency: [ x] Primary, [ ]  Primary assisted, [ ]  Secondary, [ ]  Occluded Patency judged by: [ ]  Dopper only, [x ] Palpable graft pulse, [x]  Palpable distal pulse, [ ]  ABI inc. > 0.15, [ ]  Duplex Discharge ABI: R not done, L not done Discharge TBI: R , L  D/C Ambulatory Status: Ambulatory  Complications: MI: No, [ ]  Troponin only, [ ]  EKG or Clinical CHF: No Resp failure:No, [ ]  Pneumonia, [ ]  Ventilator Chg in renal function: No, [ ]  Inc. Cr > 0.5, [ ]  Temp. Dialysis, [ ]  Permanent dialysis Stroke: No, [ ]  Minor, [ ]  Major Return to OR: No  Reason for return to OR: [ ]  Bleeding, [ ]  Infection, [ ]  Thrombosis, [ ]  Revision  Discharge medications: Statin use:  yes ASA use:   yes Plavix use:  no Beta blocker use: yes Coumadin use: no

## 2013-12-13 NOTE — Progress Notes (Signed)
OT Cancellation Note  Patient Details Name: Stephen Cohen MRN: [id number] DOB: [birth year]   Cancelled Treatment:    Reason Eval/Treat Not Completed: OT screened, no needs identified, will sign off  Kristol Almanzar,HILLARY 12/13/2013, 8:19 AM  [facility] Dayron Odland, OTR/L  [phone] 12/13/2013

## 2013-12-14 LAB — WOUND CULTURE: CULTURE: NO GROWTH

## 2013-12-15 LAB — WOUND CULTURE
CULTURE: NO GROWTH
GRAM STAIN: NONE SEEN

## 2013-12-20 ENCOUNTER — Telehealth: Payer: Self-pay

## 2013-12-20 DIAGNOSIS — T8140XA Infection following a procedure, unspecified, initial encounter: Secondary | ICD-10-CM

## 2013-12-20 MED ORDER — CEPHALEXIN 500 MG PO CAPS: 500.0000 mg | ORAL_CAPSULE | Freq: Three times a day (TID) | ORAL | Status: DC

## 2013-12-20 NOTE — Telephone Encounter (Signed)
Phone call from pt.  Complains of increased redness, and yellow drainage- left groin incision x 2-3 days.  Reports the right groin incision is a little red, but no drainage at that site.  Unaware of any chills.  States he doesn't have a thermometer.  Denies any other complaints.  Asking about an antibiotic.  States he is not able to get a ride to office today.  Discussed w/ Dr. Arbie CookeyEarly.  Recommended Keflex 500 mg tid x 7 days, and to schedule appt. within next few days to be evaluated.  Contacted pt. re: Dr. Bosie HelperEarly's recommendations.  Appt. given for 2/11 @ 8:30 AM. With Dr. Edilia Boickson.  Agrees with plan.  Informed of order for Keflex 500 mg tid; advised to start the antibiotic today.  Verb. understanding.

## 2013-12-21 ENCOUNTER — Encounter: Payer: Self-pay | Admitting: Vascular Surgery

## 2013-12-21 ENCOUNTER — Ambulatory Visit (INDEPENDENT_AMBULATORY_CARE_PROVIDER_SITE_OTHER): Payer: Self-pay | Admitting: Vascular Surgery

## 2013-12-21 VITALS — BP 135/66 | HR 70 | Temp 97.4°F | Ht 71.0 in | Wt 166.0 lb

## 2013-12-21 DIAGNOSIS — T8140XA Infection following a procedure, unspecified, initial encounter: Secondary | ICD-10-CM | POA: Insufficient documentation

## 2013-12-21 NOTE — Progress Notes (Signed)
   Patient name: Stephen Cohen MRN: [id number] DOB: Oct 14, 1941 Sex: male  REASON FOR VISIT: Erythema of groin incisions.  HPI: Stephen Cohen is a 73 y.o. male who underwent a redo right to left fem-fem bypass with 8 mm Dacron graft on 12/12/13. At that time he was noted to have a sterile fluid collection around the old nonfunctional graft. His original bypass was a right to left fem-fem bypass graft in 1995 by Dr. Arbie CookeyEarly. 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 In 2010 he had a redo right to left fem-fem bypass with a 10 mm rifampin-soaked Dacron graft.  He was just recently discharged. He states that he has not been on Keflex although it does state that it is on his medication list. He denies fever or chills. He's noted a small amount of serous drainage from the left groin incision and some mild erythema of his incisions.  REVIEW OF SYSTEMS: Arly.Keller[X ] denotes positive finding; [  ] denotes negative finding  CARDIOVASCULAR:  [ ]  chest pain   [ ]  dyspnea on exertion    CONSTITUTIONAL:  [ ]  fever   [ ]  chills  PHYSICAL EXAM: Filed Vitals:   12/21/13 0907  BP: 135/66  Pulse: 70  Temp: 97.4 F (36.3 C)  TempSrc: Oral  Height: 5\' 11"  (1.803 m)  Weight: 166 lb (75.297 kg)  SpO2: 100%   Body mass index is 23.16 kg/(m^2). GENERAL: The patient is a well-nourished male, in no acute distress. The vital signs are documented above. CARDIOVASCULAR: There is a regular rate and rhythm. PULMONARY: There is good air exchange bilaterally without wheezing or rales. He has some mild erythema at the superior aspect of both groin incisions. The area over the graft is somewhat fluctuant. There is some mild erythema over the central portion of the graft. He has palpable popliteal and pedal pulses bilaterally.  MEDICAL ISSUES: This patient is status post redo right to left fem-fem bypass graft which appears to be functioning well. However he is at risk for recurrent  graft infection and given the erythema on exam I have recommended admission to the hospital for intravenous antibiotics. The patient does not wish to be admitted. He is agreeable to take Keflex by mouth and we will have him follow up with Dr. Arbie CookeyEarly next week. He knows to call sooner if he develops increased drainage or fevers.  DICKSON,CHRISTOPHER S [facility] [place]: [phone]

## 2013-12-27 ENCOUNTER — Encounter: Payer: Medicare Other | Admitting: Vascular Surgery

## 2013-12-27 ENCOUNTER — Inpatient Hospital Stay (HOSPITAL_COMMUNITY): Admission: RE | Admit: 2013-12-27 | Payer: Medicare Other | Source: Ambulatory Visit

## 2013-12-28 ENCOUNTER — Other Ambulatory Visit: Payer: Self-pay | Admitting: *Deleted

## 2013-12-28 DIAGNOSIS — Z48812 Encounter for surgical aftercare following surgery on the circulatory system: Secondary | ICD-10-CM

## 2013-12-28 DIAGNOSIS — I739 Peripheral vascular disease, unspecified: Secondary | ICD-10-CM

## 2014-01-03 ENCOUNTER — Telehealth: Payer: Self-pay

## 2014-01-03 NOTE — Telephone Encounter (Signed)
Phone call placed to pt. To check on status of incision, due to last office exam of 12/21/13.  Today, pt. Reports that the redness of groin incisions is "almost gone."  Reports that there is a small amt. Of yellow drainage off and on.  Stated he is changing the bandage as needed.  Stated he is still taking his Keflex, and has a refill on it.  Advised to change dressing at least daily, and as needed, to keep area clean/ dry.  Encouraged to call office if symptoms worsen.  Encouraged to keep f/u appt. 01/10/14. Verb. Understanding.

## 2014-01-09 ENCOUNTER — Encounter: Payer: Self-pay | Admitting: Vascular Surgery

## 2014-01-10 ENCOUNTER — Ambulatory Visit (HOSPITAL_COMMUNITY)
Admission: RE | Admit: 2014-01-10 | Discharge: 2014-01-10 | Disposition: A | Discharge status: Home / Self Care | Payer: Medicare Other | Source: Ambulatory Visit | Attending: Vascular Surgery | Admitting: Vascular Surgery

## 2014-01-10 ENCOUNTER — Encounter (HOSPITAL_COMMUNITY): Payer: Medicare Other

## 2014-01-10 ENCOUNTER — Ambulatory Visit (INDEPENDENT_AMBULATORY_CARE_PROVIDER_SITE_OTHER): Payer: Self-pay | Admitting: Vascular Surgery

## 2014-01-10 ENCOUNTER — Encounter: Payer: Medicare Other | Admitting: Vascular Surgery

## 2014-01-10 ENCOUNTER — Encounter: Payer: Self-pay | Admitting: Vascular Surgery

## 2014-01-10 VITALS — BP 143/74 | HR 54 | Resp 16 | Ht 71.0 in | Wt 165.9 lb

## 2014-01-10 DIAGNOSIS — I739 Peripheral vascular disease, unspecified: Secondary | ICD-10-CM | POA: Insufficient documentation

## 2014-01-10 DIAGNOSIS — Z48812 Encounter for surgical aftercare following surgery on the circulatory system: Secondary | ICD-10-CM

## 2014-01-10 DIAGNOSIS — I70219 Atherosclerosis of native arteries of extremities with intermittent claudication, unspecified extremity: Secondary | ICD-10-CM

## 2014-01-10 NOTE — Progress Notes (Signed)
Here today for followup of right to left fem-fem bypass on 12/12/2013. He had presented pressure one week following surgery with some redness and drainage. He had had replacement of his old graft. There had been a sterile collection around this is concern regarding potential infecting this a Connell. He was placed on Keflex and has finished his 2 week course last night. He has no further drainage from his groin incisions and both incisions are healing quite nicely. He has an easily palpable graft pulse with no erythema. He has 2+ popliteal and dorsalis pedis pulses bilaterally. There does appear to be some fluid around the graft.  Noninvasive studies today reveal normal ankle arm index bilaterally  Impression and plan stable followup after redo right to left fem-fem bypass. He will continue usual activities that limitation. I did explain my concern regarding some fluid around the graft. We will see him again in one month to see if this is continuing to resolve. I explained we may want to aspirate this in the office under sterile conditions as well. Will be seen again in one month. Will notify should he develop any erythema or drainage.

## 2014-02-13 ENCOUNTER — Encounter: Payer: Self-pay | Admitting: Vascular Surgery

## 2014-02-14 ENCOUNTER — Ambulatory Visit (INDEPENDENT_AMBULATORY_CARE_PROVIDER_SITE_OTHER): Payer: Self-pay | Admitting: Vascular Surgery

## 2014-02-14 ENCOUNTER — Encounter: Payer: Self-pay | Admitting: Vascular Surgery

## 2014-02-14 VITALS — BP 128/64 | HR 57 | Resp 16 | Ht 71.0 in | Wt 164.0 lb

## 2014-02-14 DIAGNOSIS — I739 Peripheral vascular disease, unspecified: Secondary | ICD-10-CM

## 2014-02-14 DIAGNOSIS — I70219 Atherosclerosis of native arteries of extremities with intermittent claudication, unspecified extremity: Secondary | ICD-10-CM

## 2014-02-14 NOTE — Progress Notes (Signed)
Here today for followup of his redo fem-fem bypass 1 12/12/2013. He had developed a fluid collection around this with some drainage with minimal skin erythema. This is now completely resolved. He does have no fluid in the groins and no erythema with 2+ fem-fem graft pulse 2+ popliteal pulse and 2+ dorsalis pedis pulses bilaterally. He does have one area in the exact midline where he reports that his belt was rubbing the midportion of the graft. I do not detect any fluctuation around this and this is a very small approximately 5 mm with slight erythema. I do not see any evidence of infection. He will continue his usual activity. He is walking with no claudication symptoms. He was notified should he have any increasing redness and otherwise see us on a when necessary basis

## 2014-06-19 ENCOUNTER — Telehealth: Payer: Self-pay | Admitting: *Deleted

## 2014-06-19 ENCOUNTER — Encounter: Payer: Self-pay | Admitting: Vascular Surgery

## 2014-06-19 NOTE — Telephone Encounter (Signed)
Patient called, he is still having "clear" drainage from the incision area at his beltline ( s/p redo Fem-Fem BPG on 12-12-13 by Dr. Arbie CookeyEarly)  This has gotten somewhat better since "a blister burst about a month ago" and he says that he is having slight pain around the area but no erythema.Marland Kitchen. He reports being afebrile even though he doesn't have a thermometer. There is no odor that he can tell. He is changing his dressing every day and usually has a quarter to fifty cent piece size area of fluid on the dressing. Stephen Cohen states that he is still walking as much as possible. I made him an appt to see Dr. Arbie CookeyEarly tomorrow for evaluation of this continued drainage since patient has a fem-fem graft.

## 2014-06-20 ENCOUNTER — Other Ambulatory Visit: Payer: Self-pay

## 2014-06-20 ENCOUNTER — Ambulatory Visit (INDEPENDENT_AMBULATORY_CARE_PROVIDER_SITE_OTHER): Payer: Medicare Other | Admitting: Vascular Surgery

## 2014-06-20 ENCOUNTER — Encounter: Payer: Self-pay | Admitting: Vascular Surgery

## 2014-06-20 VITALS — BP 149/70 | HR 58 | Temp 97.4°F | Ht 71.0 in | Wt 163.0 lb

## 2014-06-20 DIAGNOSIS — Z5189 Encounter for other specified aftercare: Secondary | ICD-10-CM

## 2014-06-20 DIAGNOSIS — I739 Peripheral vascular disease, unspecified: Secondary | ICD-10-CM

## 2014-06-20 NOTE — Progress Notes (Signed)
   Patient name: Stephen Cohen MRN: [id number] DOB: 12/24/1940 Sex: male    Reason for referral:  Chief Complaint  Patient presents with  . Re-evaluation    wound check c/o increased drainage and some pain     HISTORY OF PRESENT ILLNESS: Patient has today for followup of his femoral-femoral bypass. He is very complex history with initial placement of the fem-fem bypass greater than 20 years ago. He had a prior episode of erosion of this graft and had replacement. Most recently in January 2015 he presented with severe claudication with recurrent occlusion of the graft which had been present for several months. He underwent resection and replacement of this in February with her and consent graft. On my last visit with him in April he continued with groin incisions and did have a slight area of erythema over the midportion where his belt rubbed this area. He reports that he did develop blistering there is some drainage of this for approximately 2 months. He has no claudication symptoms. He has no symptoms of sepsis  Past Medical History  Diagnosis Date  . Hypertension   . Hyperlipidemia   . Myocardial infarction 1995  . COPD (chronic obstructive pulmonary disease)   . GERD (gastroesophageal reflux disease)   . Hiatal hernia   . Peripheral vascular disease   . Arthritis   . Peptic ulcer disease   . Coronary artery disease   . Anginal pain     no pain lately  . Shortness of breath   . Pacemaker   . Automatic implantable cardioverter-defibrillator in situ   . HOH (hard of hearing)   . Ringing in ears     Past Surgical History  Procedure Laterality Date  . Pr vein bypass graft,aorto-fem-pop  1995  2005    Right to left Fem-Fem with revision   . Appendectomy    . Coronary artery bypass graft  1995  . Left heart cath Left 11-11-12  . Pace maker  February 01, 2013  . Coronary angioplasty    . Cornary stents  11/2012  . Femoral-femoral bypass graft N/A 12/12/2013    Procedure: BYPASS  GRAFT FEMORAL-FEMORAL ARTERY-  RIGHT TO LEFT using rifampin soaked hemashield graft;  Surgeon: Mykalah Saari F Kimari Coudriet, MD;  Location: [facility] OR;  Service: Vascular;  Laterality: N/A;  . Removal of graft Bilateral 12/12/2013    Procedure: REMOVAL OF Femoral- femoral hemashield GRAFT;  Surgeon: Brynja Marker F Janeka Libman, MD;  Location: [facility] OR;  Service: Vascular;  Laterality: Bilateral;    History   Social History  . Marital Status: Married    Spouse Name: N/A    Number of Children: N/A  . Years of Education: N/A   Occupational History  . Not on file.   Social History Main Topics  . Smoking status: Current Every Day Smoker -- 0.75 packs/day for 65 years    Types: Cigarettes  . Smokeless tobacco: Never Used     Comment: pt states that he is not going to quit  . Alcohol Use: No  . Drug Use: No  . Sexual Activity: Not on file   Other Topics Concern  . Not on file   Social History Narrative  . No narrative on file    Family History  Problem Relation Age of Onset  . Heart disease Mother   . Hyperlipidemia Mother   . Heart attack Mother   . Heart disease Father   . Hyperlipidemia Father   . Cancer Sister   .   Heart disease Sister   . Hyperlipidemia Sister   . Heart attack Sister   . Peripheral vascular disease Sister     Allergies as of 06/20/2014  . (No Known Allergies)    Current Outpatient Prescriptions on File Prior to Visit  Medication Sig Dispense Refill  . albuterol-ipratropium (COMBIVENT) 18-103 MCG/ACT inhaler Inhale 2 puffs into the lungs every 6 (six) hours as needed for shortness of breath. For shortness of breath      . amLODipine (NORVASC) 10 MG tablet Take 5 mg by mouth daily. Halt Tab per day      . aspirin EC 81 MG tablet Take 81 mg by mouth daily.      . atorvastatin (LIPITOR) 10 MG tablet Take 10 mg by mouth daily.      . cephALEXin (KEFLEX) 500 MG capsule Take 1 capsule (500 mg total) by mouth 3 (three) times daily.  21 capsule  0  . diphenhydrAMINE (BENADRYL) 25 mg capsule Take  25 mg by mouth daily as needed (for running nose).      . Esomeprazole Magnesium (NEXIUM PO) Take 1 tablet by mouth daily before breakfast.      . furosemide (LASIX) 40 MG tablet Take 40 mg by mouth daily.      . hydrALAZINE (APRESOLINE) 25 MG tablet Take 12.5 mg by mouth 3 (three) times daily. 1/2 tablet tid. Cutting down on medication due to heart rate per patient      . isosorbide mononitrate (IMDUR) 120 MG 24 hr tablet Take 120 mg by mouth daily.      . losartan (COZAAR) 25 MG tablet Take 25 mg by mouth daily.      . metoprolol succinate (TOPROL-XL) 25 MG 24 hr tablet Take 50 mg by mouth daily.      . niacin (SLO-NIACIN) 500 MG tablet Take 500 mg by mouth 2 (two) times daily with a meal.      . nitroGLYCERIN (NITROSTAT) 0.4 MG SL tablet Place 0.4 mg under the tongue every 5 (five) minutes as needed. For chest pain      . oxyCODONE-acetaminophen (PERCOCET/ROXICET) 5-325 MG per tablet Take 1 tablet by mouth every 6 (six) hours as needed.  30 tablet  0  . potassium chloride SA (K-DUR,KLOR-CON) 20 MEQ tablet Take 20 mEq by mouth 2 (two) times daily.      . allopurinol (ZYLOPRIM) 100 MG tablet Take 100 mg by mouth daily. For gout       No current facility-administered medications on file prior to visit.       PHYSICAL EXAMINATION:  General: The patient is a well-nourished male, in no acute distress. Vital signs are BP 149/70  Pulse 58  Temp(Src) 97.4 F (36.3 C) (Oral)  Ht 5' 11" (1.803 m)  Wt 163 lb (73.936 kg)  BMI 22.74 kg/m2  SpO2 96% Pulmonary: There is a good air exchange  Abdomen: Soft and non-tender with normal pitch bowel sounds. Does have an area of graft exposure in the midline of his fem-fem bypass. Musculoskeletal: There are no major deformities.  There is no significant extremity pain. Neurologic: No focal weakness or paresthesias are detected, Skin: There are no ulcer or rashes noted. Psychiatric: The patient has normal affect. Cardiovascular: 2+ femoral pulses and  2+ dorsalis pedis pulses bilaterally. No evidence of groin involvement from his any evidence of infection or fluctuance.    Impression and Plan:  Patent fem-fem bypass with erosion in the midportion with exposed Dacron graft. At long discussion   with the patient explaining the importance for resection of this area to prevent complete involvement of the entire. Hopefully can avoid reexploration of his groins at this is not involved and simply placed in a position of graft around this area. We will schedule this for August 24 at his convenience. He has a health care issues with his wife how to prevent earlier treatment. This has been present for several months Comfortable with the waiting until the 24th    Turon Kilmer [facility] [place] Office: [phone]         

## 2014-06-28 ENCOUNTER — Other Ambulatory Visit (HOSPITAL_COMMUNITY): Payer: Self-pay | Admitting: *Deleted

## 2014-06-28 ENCOUNTER — Encounter (HOSPITAL_COMMUNITY): Payer: Self-pay | Admitting: Pharmacy Technician

## 2014-06-28 NOTE — Pre-Procedure Instructions (Signed)
Orvilla FusBilly R Demonte  06/28/2014   Your procedure is scheduled on:  Monday, July 03, 2014 at 7:30 AM.   Report to [facility] Entrance "A" Admitting Office at 5:30 AM.   Call this number if you have problems the morning of surgery: [phone]   Remember:   Do not eat food or drink liquids after midnight Sunday, 07/02/14.   Take these medicines the morning of surgery with A SIP OF WATER: amLODipine (NORVASC), aspirin, hydrALAZINE (APRESOLINE), metoprolol succinate (TOPROL-XL), nitroGLYCERIN (NITROSTAT) - if needed, albuterol-ipratropium (COMBIVENT) inhaler - if needed.    Do not wear jewelry.  Do not wear lotions, powders, or cologne. You may wear deodorant.  Men may shave face and neck.  Do not bring valuables to the hospital.  [facility] is not responsible                  for any belongings or valuables.               Contacts, dentures or bridgework may not be worn into surgery.  Leave suitcase in the car. After surgery it may be brought to your room.  For patients admitted to the hospital, discharge time is determined by your                treatment team.               Special Instructions: [place] - Preparing for Surgery  Before surgery, you can play an important role.  Because skin is not sterile, your skin needs to be as free of germs as possible.  You can reduce the number of germs on you skin by washing with CHG (chlorahexidine gluconate) soap before surgery.  CHG is an antiseptic cleaner which kills germs and bonds with the skin to continue killing germs even after washing.  Please DO NOT use if you have an allergy to CHG or antibacterial soaps.  If your skin becomes reddened/irritated stop using the CHG and inform your nurse when you arrive at Short Stay.  Do not shave (including legs and underarms) for at least 48 hours prior to the first CHG shower.  You may shave your face.  Please follow these instructions carefully:   1.  Shower with CHG Soap the night  before surgery and the                                morning of Surgery.  2.  If you choose to wash your hair, wash your hair first as usual with your       normal shampoo.  3.  After you shampoo, rinse your hair and body thoroughly to remove the                      Shampoo.  4.  Use CHG as you would any other liquid soap.  You can apply chg directly       to the skin and wash gently with scrungie or a clean washcloth.  5.  Apply the CHG Soap to your body ONLY FROM THE NECK DOWN.        Do not use on open wounds or open sores.  Avoid contact with your eyes, ears, mouth and genitals (private parts).  Wash genitals (private parts) with your normal soap.  6.  Wash thoroughly, paying special attention to the area where your surgery  will be performed.  7.  Thoroughly rinse your body with warm water from the neck down.  8.  DO NOT shower/wash with your normal soap after using and rinsing off       the CHG Soap.  9.  Pat yourself dry with a clean towel.            10.  Wear clean pajamas.            11.  Place clean sheets on your bed the night of your first shower and do not        sleep with pets.  Day of Surgery  Do not apply any lotions the morning of surgery.  Please wear clean clothes to the hospital/surgery center.     Please read over the following fact sheets that you were given: Pain Booklet, Coughing and Deep Breathing, Blood Transfusion Information, MRSA Information and Surgical Site Infection Prevention

## 2014-06-29 ENCOUNTER — Encounter (HOSPITAL_COMMUNITY): Payer: Self-pay

## 2014-06-29 ENCOUNTER — Encounter (HOSPITAL_COMMUNITY)
Admission: RE | Admit: 2014-06-29 | Discharge: 2014-06-29 | Disposition: A | Discharge status: Home / Self Care | Payer: Medicare Other | Source: Ambulatory Visit | Attending: Anesthesiology | Admitting: Anesthesiology

## 2014-06-29 ENCOUNTER — Encounter (HOSPITAL_COMMUNITY)
Admission: RE | Admit: 2014-06-29 | Discharge: 2014-06-29 | Disposition: A | Discharge status: Home / Self Care | Payer: Medicare Other | Source: Ambulatory Visit | Attending: Vascular Surgery | Admitting: Vascular Surgery

## 2014-06-29 DIAGNOSIS — I70509 Unspecified atherosclerosis of nonautologous biological bypass graft(s) of the extremities, unspecified extremity: Secondary | ICD-10-CM | POA: Diagnosis not present

## 2014-06-29 DIAGNOSIS — Z01818 Encounter for other preprocedural examination: Secondary | ICD-10-CM | POA: Diagnosis present

## 2014-06-29 HISTORY — DX: Unspecified cataract: H26.9

## 2014-06-29 LAB — APTT: aPTT: 38 seconds — ABNORMAL HIGH (ref 24–37)

## 2014-06-29 LAB — CBC
HCT: 41.8 % (ref 39.0–52.0)
Hemoglobin: 13.3 g/dL (ref 13.0–17.0)
MCH: 28.1 pg (ref 26.0–34.0)
MCHC: 31.8 g/dL (ref 30.0–36.0)
MCV: 88.2 fL (ref 78.0–100.0)
Platelets: 250 10*3/uL (ref 150–400)
RBC: 4.74 MIL/uL (ref 4.22–5.81)
RDW: 13.8 % (ref 11.5–15.5)
WBC: 10.8 10*3/uL — ABNORMAL HIGH (ref 4.0–10.5)

## 2014-06-29 LAB — SURGICAL PCR SCREEN
MRSA, PCR: NEGATIVE
Staphylococcus aureus: NEGATIVE

## 2014-06-29 LAB — TYPE AND SCREEN
ABO/RH(D): O NEG
Antibody Screen: NEGATIVE

## 2014-06-29 LAB — URINALYSIS, ROUTINE W REFLEX MICROSCOPIC
BILIRUBIN URINE: NEGATIVE
Glucose, UA: NEGATIVE mg/dL
HGB URINE DIPSTICK: NEGATIVE
Ketones, ur: NEGATIVE mg/dL
Leukocytes, UA: NEGATIVE
Nitrite: NEGATIVE
PH: 6 (ref 5.0–8.0)
Protein, ur: NEGATIVE mg/dL
SPECIFIC GRAVITY, URINE: 1.014 (ref 1.005–1.030)
Urobilinogen, UA: 0.2 mg/dL (ref 0.0–1.0)

## 2014-06-29 LAB — COMPREHENSIVE METABOLIC PANEL
ALBUMIN: 3.6 g/dL (ref 3.5–5.2)
ALT: 11 U/L (ref 0–53)
ANION GAP: 13 (ref 5–15)
AST: 15 U/L (ref 0–37)
Alkaline Phosphatase: 97 U/L (ref 39–117)
BILIRUBIN TOTAL: 0.8 mg/dL (ref 0.3–1.2)
BUN: 11 mg/dL (ref 6–23)
CO2: 24 meq/L (ref 19–32)
CREATININE: 1.19 mg/dL (ref 0.50–1.35)
Calcium: 9.1 mg/dL (ref 8.4–10.5)
Chloride: 103 mEq/L (ref 96–112)
GFR calc Af Amer: 68 mL/min — ABNORMAL LOW (ref >90)
GFR calc non Af Amer: 59 mL/min — ABNORMAL LOW (ref >90)
GLUCOSE: 93 mg/dL (ref 70–99)
Potassium: 4.1 mEq/L (ref 3.7–5.3)
Sodium: 140 mEq/L (ref 137–147)
Total Protein: 7.2 g/dL (ref 6.0–8.3)

## 2014-06-29 LAB — PROTIME-INR
INR: 1.05 (ref 0.00–1.49)
PROTHROMBIN TIME: 13.7 s (ref 11.6–15.2)

## 2014-06-29 NOTE — Progress Notes (Signed)
Pt's cardiologist is Dr. Norman Herr[place]Brian [place] in [place]. Pt states he's not had any angina pain for 2-3 months. Pre-op Rx for Implanted Cardiac Device programming faxed to Dr. Hulen ShoutsMunley's office on 06/28/14.

## 2014-06-29 NOTE — Progress Notes (Signed)
[employer] results faxed to Dr. Gabriel Cirriajan Mitchell, pt's PCP.

## 2014-06-29 NOTE — Progress Notes (Signed)
06/29/14 1307  OBSTRUCTIVE SLEEP APNEA  Have you ever been diagnosed with sleep apnea through a sleep study? No  Do you snore loudly (loud enough to be heard through closed doors)?  0  Do you often feel tired, fatigued, or sleepy during the daytime? 0  Has anyone observed you stop breathing during your sleep? 1  Do you have, or are you being treated for high blood pressure? 1  BMI more than 35 kg/m2? 0  Age over 73 years old? 1  Neck circumference greater than 40 cm/16 inches? 0  Gender: 1  Obstructive Sleep Apnea Score 4  Score 4 or greater  Results sent to PCP

## 2014-07-02 MED ORDER — CHLORHEXIDINE GLUCONATE CLOTH 2 % EX PADS: 6.0000 | MEDICATED_PAD | Freq: Once | CUTANEOUS | Status: DC

## 2014-07-02 MED ORDER — SODIUM CHLORIDE 0.9 % IV SOLN: INTRAVENOUS | Status: DC

## 2014-07-02 MED ORDER — DEXTROSE 5 % IV SOLN
1.5000 g | INTRAVENOUS | Status: AC
  Administered 2014-07-03: 1.5 g via INTRAVENOUS
  Filled 2014-07-02: qty 1.5

## 2014-07-03 ENCOUNTER — Inpatient Hospital Stay (HOSPITAL_COMMUNITY): Payer: Medicare Other | Admitting: Certified Registered"

## 2014-07-03 ENCOUNTER — Encounter (HOSPITAL_COMMUNITY): Payer: Medicare Other | Admitting: Certified Registered"

## 2014-07-03 ENCOUNTER — Encounter (HOSPITAL_COMMUNITY): Payer: Self-pay | Admitting: Surgery

## 2014-07-03 ENCOUNTER — Encounter (HOSPITAL_COMMUNITY)
Admission: RE | Disposition: A | Discharge status: Home / Self Care | Payer: Self-pay | Source: Ambulatory Visit | Attending: Vascular Surgery

## 2014-07-03 ENCOUNTER — Telehealth: Payer: Self-pay | Admitting: Vascular Surgery

## 2014-07-03 ENCOUNTER — Inpatient Hospital Stay (HOSPITAL_COMMUNITY)
Admission: RE | Admit: 2014-07-03 | Discharge: 2014-07-04 | DRG: 254 | Disposition: A | Discharge status: Home / Self Care | Payer: Medicare Other | Source: Ambulatory Visit | Attending: Vascular Surgery | Admitting: Vascular Surgery

## 2014-07-03 DIAGNOSIS — Z950 Presence of cardiac pacemaker: Secondary | ICD-10-CM | POA: Diagnosis not present

## 2014-07-03 DIAGNOSIS — T82898A Other specified complication of vascular prosthetic devices, implants and grafts, initial encounter: Secondary | ICD-10-CM | POA: Diagnosis present

## 2014-07-03 DIAGNOSIS — Z79899 Other long term (current) drug therapy: Secondary | ICD-10-CM | POA: Diagnosis not present

## 2014-07-03 DIAGNOSIS — J449 Chronic obstructive pulmonary disease, unspecified: Secondary | ICD-10-CM | POA: Diagnosis present

## 2014-07-03 DIAGNOSIS — I10 Essential (primary) hypertension: Secondary | ICD-10-CM | POA: Diagnosis present

## 2014-07-03 DIAGNOSIS — Z9861 Coronary angioplasty status: Secondary | ICD-10-CM | POA: Diagnosis not present

## 2014-07-03 DIAGNOSIS — Z7982 Long term (current) use of aspirin: Secondary | ICD-10-CM

## 2014-07-03 DIAGNOSIS — Z8711 Personal history of peptic ulcer disease: Secondary | ICD-10-CM

## 2014-07-03 DIAGNOSIS — J4489 Other specified chronic obstructive pulmonary disease: Secondary | ICD-10-CM | POA: Diagnosis present

## 2014-07-03 DIAGNOSIS — I252 Old myocardial infarction: Secondary | ICD-10-CM | POA: Diagnosis not present

## 2014-07-03 DIAGNOSIS — Z951 Presence of aortocoronary bypass graft: Secondary | ICD-10-CM | POA: Diagnosis not present

## 2014-07-03 DIAGNOSIS — Z8249 Family history of ischemic heart disease and other diseases of the circulatory system: Secondary | ICD-10-CM | POA: Diagnosis not present

## 2014-07-03 DIAGNOSIS — F17200 Nicotine dependence, unspecified, uncomplicated: Secondary | ICD-10-CM | POA: Diagnosis present

## 2014-07-03 DIAGNOSIS — E785 Hyperlipidemia, unspecified: Secondary | ICD-10-CM | POA: Diagnosis present

## 2014-07-03 DIAGNOSIS — I739 Peripheral vascular disease, unspecified: Secondary | ICD-10-CM | POA: Diagnosis present

## 2014-07-03 DIAGNOSIS — I2510 Atherosclerotic heart disease of native coronary artery without angina pectoris: Secondary | ICD-10-CM | POA: Diagnosis present

## 2014-07-03 DIAGNOSIS — M109 Gout, unspecified: Secondary | ICD-10-CM | POA: Diagnosis present

## 2014-07-03 DIAGNOSIS — K219 Gastro-esophageal reflux disease without esophagitis: Secondary | ICD-10-CM | POA: Diagnosis present

## 2014-07-03 DIAGNOSIS — Y832 Surgical operation with anastomosis, bypass or graft as the cause of abnormal reaction of the patient, or of later complication, without mention of misadventure at the time of the procedure: Secondary | ICD-10-CM | POA: Diagnosis present

## 2014-07-03 DIAGNOSIS — T859XXA Unspecified complication of internal prosthetic device, implant and graft, initial encounter: Secondary | ICD-10-CM | POA: Diagnosis present

## 2014-07-03 HISTORY — PX: FEMORAL-FEMORAL BYPASS GRAFT: SHX936

## 2014-07-03 SURGERY — CREATION, BYPASS, ARTERIAL, FEMORAL TO FEMORAL, USING GRAFT
Anesthesia: General | Site: Groin | Laterality: Bilateral

## 2014-07-03 MED ORDER — OXYCODONE HCL 5 MG PO TABS
5.0000 mg | ORAL_TABLET | ORAL | Status: DC | PRN
  Administered 2014-07-04: 5 mg via ORAL
  Filled 2014-07-03: qty 1

## 2014-07-03 MED ORDER — MORPHINE SULFATE 2 MG/ML IJ SOLN: 2.0000 mg | INTRAMUSCULAR | Status: DC | PRN

## 2014-07-03 MED ORDER — DIPHENHYDRAMINE HCL 25 MG PO CAPS: 50.0000 mg | ORAL_CAPSULE | Freq: Every day | ORAL | Status: DC | PRN

## 2014-07-03 MED ORDER — PHENYLEPHRINE HCL 10 MG/ML IJ SOLN
10.0000 mg | INTRAMUSCULAR | Status: DC | PRN
  Administered 2014-07-03: 20 ug/min via INTRAVENOUS

## 2014-07-03 MED ORDER — LIDOCAINE HCL (CARDIAC) 20 MG/ML IV SOLN
INTRAVENOUS | Status: DC | PRN
  Administered 2014-07-03: 60 mg via INTRAVENOUS

## 2014-07-03 MED ORDER — SODIUM CHLORIDE 0.9 % IV SOLN: 500.0000 mL | Freq: Once | INTRAVENOUS | Status: AC | PRN

## 2014-07-03 MED ORDER — DOCUSATE SODIUM 100 MG PO CAPS: 100.0000 mg | ORAL_CAPSULE | Freq: Every day | ORAL | Status: DC

## 2014-07-03 MED ORDER — PHENOL 1.4 % MT LIQD: 1.0000 | OROMUCOSAL | Status: DC | PRN

## 2014-07-03 MED ORDER — ACETAMINOPHEN 325 MG PO TABS: 325.0000 mg | ORAL_TABLET | ORAL | Status: DC | PRN

## 2014-07-03 MED ORDER — FENTANYL CITRATE 0.05 MG/ML IJ SOLN
INTRAMUSCULAR | Status: DC | PRN
  Administered 2014-07-03: 100 ug via INTRAVENOUS

## 2014-07-03 MED ORDER — STERILE WATER FOR INJECTION IJ SOLN
INTRAMUSCULAR | Status: AC
  Filled 2014-07-03: qty 10

## 2014-07-03 MED ORDER — METOPROLOL TARTRATE 1 MG/ML IV SOLN: 2.0000 mg | INTRAVENOUS | Status: DC | PRN

## 2014-07-03 MED ORDER — FENTANYL CITRATE 0.05 MG/ML IJ SOLN
INTRAMUSCULAR | Status: AC
  Filled 2014-07-03: qty 5

## 2014-07-03 MED ORDER — ARTIFICIAL TEARS OP OINT
TOPICAL_OINTMENT | OPHTHALMIC | Status: AC
  Filled 2014-07-03: qty 3.5

## 2014-07-03 MED ORDER — POTASSIUM CHLORIDE CRYS ER 20 MEQ PO TBCR: 20.0000 meq | EXTENDED_RELEASE_TABLET | Freq: Every day | ORAL | Status: DC | PRN

## 2014-07-03 MED ORDER — DEXTROSE 5 % IV SOLN
1.5000 g | Freq: Two times a day (BID) | INTRAVENOUS | Status: DC
  Administered 2014-07-03: 1.5 g via INTRAVENOUS
  Filled 2014-07-03 (×2): qty 1.5

## 2014-07-03 MED ORDER — 0.9 % SODIUM CHLORIDE (POUR BTL) OPTIME
TOPICAL | Status: DC | PRN
  Administered 2014-07-03: 2000 mL

## 2014-07-03 MED ORDER — HEPARIN SODIUM (PORCINE) 1000 UNIT/ML IJ SOLN
INTRAMUSCULAR | Status: DC | PRN
  Administered 2014-07-03: 7000 [IU] via INTRAVENOUS

## 2014-07-03 MED ORDER — ONDANSETRON HCL 4 MG/2ML IJ SOLN
INTRAMUSCULAR | Status: AC
  Filled 2014-07-03: qty 2

## 2014-07-03 MED ORDER — IPRATROPIUM-ALBUTEROL 0.5-2.5 (3) MG/3ML IN SOLN: 3.0000 mL | Freq: Four times a day (QID) | RESPIRATORY_TRACT | Status: DC | PRN

## 2014-07-03 MED ORDER — PROPOFOL 10 MG/ML IV BOLUS
INTRAVENOUS | Status: DC | PRN
  Administered 2014-07-03: 50 mg via INTRAVENOUS
  Administered 2014-07-03: 120 mg via INTRAVENOUS
  Administered 2014-07-03: 30 mg via INTRAVENOUS

## 2014-07-03 MED ORDER — FUROSEMIDE 40 MG PO TABS
40.0000 mg | ORAL_TABLET | Freq: Every day | ORAL | Status: DC
  Administered 2014-07-03: 40 mg via ORAL
  Filled 2014-07-03 (×2): qty 1

## 2014-07-03 MED ORDER — AMLODIPINE BESYLATE 10 MG PO TABS
10.0000 mg | ORAL_TABLET | Freq: Every day | ORAL | Status: DC
  Filled 2014-07-03: qty 1

## 2014-07-03 MED ORDER — GUAIFENESIN-DM 100-10 MG/5ML PO SYRP: 15.0000 mL | ORAL_SOLUTION | ORAL | Status: DC | PRN

## 2014-07-03 MED ORDER — ASPIRIN EC 81 MG PO TBEC
81.0000 mg | DELAYED_RELEASE_TABLET | Freq: Every day | ORAL | Status: DC
  Filled 2014-07-03: qty 1

## 2014-07-03 MED ORDER — ALUM & MAG HYDROXIDE-SIMETH 200-200-20 MG/5ML PO SUSP: 15.0000 mL | ORAL | Status: DC | PRN

## 2014-07-03 MED ORDER — ROCURONIUM BROMIDE 50 MG/5ML IV SOLN
INTRAVENOUS | Status: AC
  Filled 2014-07-03: qty 1

## 2014-07-03 MED ORDER — HYDRALAZINE HCL 20 MG/ML IJ SOLN: 10.0000 mg | INTRAMUSCULAR | Status: DC | PRN

## 2014-07-03 MED ORDER — SODIUM CHLORIDE 0.9 % IJ SOLN
INTRAMUSCULAR | Status: AC
  Filled 2014-07-03: qty 10

## 2014-07-03 MED ORDER — ACETAMINOPHEN 650 MG RE SUPP
325.0000 mg | RECTAL | Status: DC | PRN
Start: 2014-07-03 — End: 2014-07-04

## 2014-07-03 MED ORDER — EPHEDRINE SULFATE 50 MG/ML IJ SOLN
INTRAMUSCULAR | Status: DC | PRN
  Administered 2014-07-03: 15 mg via INTRAVENOUS

## 2014-07-03 MED ORDER — PROPOFOL 10 MG/ML IV BOLUS
INTRAVENOUS | Status: AC
  Filled 2014-07-03: qty 20

## 2014-07-03 MED ORDER — ARTIFICIAL TEARS OP OINT
TOPICAL_OINTMENT | OPHTHALMIC | Status: DC | PRN
  Administered 2014-07-03: 1 via OPHTHALMIC

## 2014-07-03 MED ORDER — OXYCODONE HCL 5 MG/5ML PO SOLN: 5.0000 mg | Freq: Once | ORAL | Status: DC | PRN

## 2014-07-03 MED ORDER — LABETALOL HCL 5 MG/ML IV SOLN: 10.0000 mg | INTRAVENOUS | Status: DC | PRN

## 2014-07-03 MED ORDER — NEOSTIGMINE METHYLSULFATE 10 MG/10ML IV SOLN
INTRAVENOUS | Status: DC | PRN
  Administered 2014-07-03: 3 mg via INTRAVENOUS

## 2014-07-03 MED ORDER — LACTATED RINGERS IV SOLN
INTRAVENOUS | Status: DC | PRN
  Administered 2014-07-03: 07:00:00 via INTRAVENOUS

## 2014-07-03 MED ORDER — ONDANSETRON HCL 4 MG/2ML IJ SOLN
INTRAMUSCULAR | Status: DC | PRN
  Administered 2014-07-03: 4 mg via INTRAVENOUS

## 2014-07-03 MED ORDER — IPRATROPIUM-ALBUTEROL 18-103 MCG/ACT IN AERO: 2.0000 | INHALATION_SPRAY | Freq: Four times a day (QID) | RESPIRATORY_TRACT | Status: DC | PRN

## 2014-07-03 MED ORDER — HYDRALAZINE HCL 25 MG PO TABS
12.5000 mg | ORAL_TABLET | Freq: Three times a day (TID) | ORAL | Status: DC
  Administered 2014-07-03: 12.5 mg via ORAL
  Filled 2014-07-03 (×5): qty 0.5

## 2014-07-03 MED ORDER — DOPAMINE-DEXTROSE 3.2-5 MG/ML-% IV SOLN: 3.0000 ug/kg/min | INTRAVENOUS | Status: DC

## 2014-07-03 MED ORDER — PROTAMINE SULFATE 10 MG/ML IV SOLN
INTRAVENOUS | Status: DC | PRN
  Administered 2014-07-03 (×5): 10 mg via INTRAVENOUS

## 2014-07-03 MED ORDER — PANTOPRAZOLE SODIUM 40 MG PO TBEC: 40.0000 mg | DELAYED_RELEASE_TABLET | Freq: Every day | ORAL | Status: DC

## 2014-07-03 MED ORDER — GLYCOPYRROLATE 0.2 MG/ML IJ SOLN
INTRAMUSCULAR | Status: DC | PRN
  Administered 2014-07-03: 0.4 mg via INTRAVENOUS

## 2014-07-03 MED ORDER — CEPHALEXIN 500 MG PO CAPS: 500.0000 mg | ORAL_CAPSULE | Freq: Three times a day (TID) | ORAL | Status: DC

## 2014-07-03 MED ORDER — LIDOCAINE HCL (CARDIAC) 20 MG/ML IV SOLN
INTRAVENOUS | Status: AC
  Filled 2014-07-03: qty 5

## 2014-07-03 MED ORDER — OXYCODONE HCL 5 MG PO TABS: 5.0000 mg | ORAL_TABLET | Freq: Once | ORAL | Status: DC | PRN

## 2014-07-03 MED ORDER — ONDANSETRON HCL 4 MG/2ML IJ SOLN: 4.0000 mg | Freq: Four times a day (QID) | INTRAMUSCULAR | Status: DC | PRN

## 2014-07-03 MED ORDER — ACETAMINOPHEN 160 MG/5ML PO SOLN
325.0000 mg | ORAL | Status: DC | PRN
  Filled 2014-07-03: qty 20.3

## 2014-07-03 MED ORDER — GLYCOPYRROLATE 0.2 MG/ML IJ SOLN
INTRAMUSCULAR | Status: AC
  Filled 2014-07-03: qty 2

## 2014-07-03 MED ORDER — NEOSTIGMINE METHYLSULFATE 10 MG/10ML IV SOLN
INTRAVENOUS | Status: AC
  Filled 2014-07-03: qty 1

## 2014-07-03 MED ORDER — FENTANYL CITRATE 0.05 MG/ML IJ SOLN: 25.0000 ug | INTRAMUSCULAR | Status: DC | PRN

## 2014-07-03 MED ORDER — LOSARTAN POTASSIUM 25 MG PO TABS
25.0000 mg | ORAL_TABLET | Freq: Every day | ORAL | Status: DC
  Administered 2014-07-03: 25 mg via ORAL
  Filled 2014-07-03 (×2): qty 1

## 2014-07-03 MED ORDER — PROTAMINE SULFATE 10 MG/ML IV SOLN
INTRAVENOUS | Status: AC
  Filled 2014-07-03: qty 5

## 2014-07-03 MED ORDER — ATORVASTATIN CALCIUM 10 MG PO TABS
10.0000 mg | ORAL_TABLET | Freq: Every day | ORAL | Status: DC
  Administered 2014-07-03: 10 mg via ORAL
  Filled 2014-07-03 (×2): qty 1

## 2014-07-03 MED ORDER — OXYCODONE HCL 5 MG PO CAPS: 5.0000 mg | ORAL_CAPSULE | Freq: Four times a day (QID) | ORAL | Status: DC | PRN

## 2014-07-03 MED ORDER — NITROGLYCERIN 0.4 MG SL SUBL: 0.4000 mg | SUBLINGUAL_TABLET | SUBLINGUAL | Status: DC | PRN

## 2014-07-03 MED ORDER — POTASSIUM CHLORIDE CRYS ER 20 MEQ PO TBCR
20.0000 meq | EXTENDED_RELEASE_TABLET | Freq: Every day | ORAL | Status: DC
  Administered 2014-07-03: 20 meq via ORAL
  Filled 2014-07-03 (×2): qty 1

## 2014-07-03 MED ORDER — SODIUM CHLORIDE 0.9 % IV SOLN
INTRAVENOUS | Status: DC
  Administered 2014-07-03: 15:00:00 via INTRAVENOUS

## 2014-07-03 MED ORDER — HEPARIN SODIUM (PORCINE) 1000 UNIT/ML IJ SOLN
INTRAMUSCULAR | Status: AC
  Filled 2014-07-03: qty 1

## 2014-07-03 MED ORDER — METOPROLOL SUCCINATE ER 50 MG PO TB24
50.0000 mg | ORAL_TABLET | Freq: Every day | ORAL | Status: DC
  Filled 2014-07-03: qty 1

## 2014-07-03 MED ORDER — EPHEDRINE SULFATE 50 MG/ML IJ SOLN
INTRAMUSCULAR | Status: AC
  Filled 2014-07-03: qty 1

## 2014-07-03 MED ORDER — ROCURONIUM BROMIDE 100 MG/10ML IV SOLN
INTRAVENOUS | Status: DC | PRN
  Administered 2014-07-03: 40 mg via INTRAVENOUS

## 2014-07-03 MED ORDER — SODIUM CHLORIDE 0.9 % IR SOLN
Status: DC | PRN
  Administered 2014-07-03: 08:00:00

## 2014-07-03 SURGICAL SUPPLY — 49 items
APL SKNCLS STERI-STRIP NONHPOA (GAUZE/BANDAGES/DRESSINGS) ×2
BENZOIN TINCTURE PRP APPL 2/3 (GAUZE/BANDAGES/DRESSINGS) ×5 IMPLANT
BLADE 10 SAFETY STRL DISP (BLADE) ×3 IMPLANT
CANISTER SUCTION 2500CC (MISCELLANEOUS) ×3 IMPLANT
CANNULA VESSEL 3MM 2 BLNT TIP (CANNULA) ×3 IMPLANT
CLIP LIGATING EXTRA MED SLVR (CLIP) ×3 IMPLANT
CLIP LIGATING EXTRA SM BLUE (MISCELLANEOUS) ×3 IMPLANT
CLOSURE STERI-STRIP 1/2X4 (GAUZE/BANDAGES/DRESSINGS) ×2
CLOSURE WOUND 1/2 X4 (GAUZE/BANDAGES/DRESSINGS) ×1
CLSR STERI-STRIP ANTIMIC 1/2X4 (GAUZE/BANDAGES/DRESSINGS) ×2 IMPLANT
COVER SURGICAL LIGHT HANDLE (MISCELLANEOUS) ×3 IMPLANT
DRAIN SNY 10X20 3/4 PERF (WOUND CARE) IMPLANT
DRAPE WARM FLUID 44X44 (DRAPE) ×3 IMPLANT
DRSG COVADERM 4X10 (GAUZE/BANDAGES/DRESSINGS) IMPLANT
ELECT REM PT RETURN 9FT ADLT (ELECTROSURGICAL) ×3
ELECTRODE REM PT RTRN 9FT ADLT (ELECTROSURGICAL) ×1 IMPLANT
EVACUATOR SILICONE 100CC (DRAIN) IMPLANT
GLOVE BIO SURGEON STRL SZ7.5 (GLOVE) ×2 IMPLANT
GLOVE BIOGEL PI IND STRL 8 (GLOVE) IMPLANT
GLOVE BIOGEL PI INDICATOR 8 (GLOVE) ×2
GLOVE SS BIOGEL STRL SZ 7.5 (GLOVE) ×1 IMPLANT
GLOVE SUPERSENSE BIOGEL SZ 7.5 (GLOVE) ×2
GOWN STRL REUS W/ TWL LRG LVL3 (GOWN DISPOSABLE) ×3 IMPLANT
GOWN STRL REUS W/ TWL XL LVL3 (GOWN DISPOSABLE) IMPLANT
GOWN STRL REUS W/TWL LRG LVL3 (GOWN DISPOSABLE) ×9
GOWN STRL REUS W/TWL XL LVL3 (GOWN DISPOSABLE) ×3
GRAFT GORETEX (Vascular Products) ×2 IMPLANT
KIT BASIN OR (CUSTOM PROCEDURE TRAY) ×3 IMPLANT
KIT ROOM TURNOVER OR (KITS) ×3 IMPLANT
NS IRRIG 1000ML POUR BTL (IV SOLUTION) ×6 IMPLANT
PACK PERIPHERAL VASCULAR (CUSTOM PROCEDURE TRAY) ×3 IMPLANT
PAD ARMBOARD 7.5X6 YLW CONV (MISCELLANEOUS) ×6 IMPLANT
SPONGE GAUZE 4X4 12PLY STER LF (GAUZE/BANDAGES/DRESSINGS) ×6 IMPLANT
STAPLER VISISTAT 35W (STAPLE) IMPLANT
STRIP CLOSURE SKIN 1/2X4 (GAUZE/BANDAGES/DRESSINGS) ×2 IMPLANT
SUT ETHILON 3 0 PS 1 (SUTURE) IMPLANT
SUT PROLENE 5 0 C 1 24 (SUTURE) ×6 IMPLANT
SUT PROLENE 6 0 CC (SUTURE) ×6 IMPLANT
SUT SILK 2 0 SH (SUTURE) ×4 IMPLANT
SUT VIC AB 2-0 CTX 36 (SUTURE) ×6 IMPLANT
SUT VIC AB 3-0 SH 27 (SUTURE) ×12
SUT VIC AB 3-0 SH 27X BRD (SUTURE) ×2 IMPLANT
SWAB COLLECTION DEVICE MRSA (MISCELLANEOUS) ×2 IMPLANT
TAPE CLOTH SURG 4X10 WHT LF (GAUZE/BANDAGES/DRESSINGS) ×6 IMPLANT
TOWEL OR 17X24 6PK STRL BLUE (TOWEL DISPOSABLE) ×6 IMPLANT
TOWEL OR 17X26 10 PK STRL BLUE (TOWEL DISPOSABLE) ×3 IMPLANT
TRAY FOLEY CATH 16FRSI W/METER (SET/KITS/TRAYS/PACK) ×3 IMPLANT
UNDERPAD 30X30 INCONTINENT (UNDERPADS AND DIAPERS) ×3 IMPLANT
WATER STERILE IRR 1000ML POUR (IV SOLUTION) ×3 IMPLANT

## 2014-07-03 NOTE — Op Note (Signed)
    OPERATIVE REPORT  DATE OF SURGERY: 07/03/2014  PATIENT: Stephen Cohen, 73 y.o. male MRN: [id number]  DOB: [birth year]  PRE-OPERATIVE DIAGNOSIS: Exposed Dacron graft and midportion of the right to left fem-fem bypass  POST-OPERATIVE DIAGNOSIS:  Same  PROCEDURE: Revision of right to left fem-fem bypass with interposition Gore-Tex and removal of exposed Dacron graft  SURGEON:  [name], M.D.  PHYSICIAN ASSISTANT: Rhyne  ANESTHESIA:  Gen.  EBL: Minimal ml  Total I/O In: 750 [I.V.:750] Out: 100 [Blood:100]  BLOOD ADMINISTERED: None  DRAINS: None  SPECIMEN: Culture of Dacron  COUNTS CORRECT:  YES  PLAN OF CARE: PACU   PATIENT DISPOSITION:  PACU - hemodynamically stable  PROCEDURE DETAILS: Patient is 20 years status post right to left fem-fem. His had several different revisions over this period of time. He presented to the office a week ago with erosion of the Dacron graft material in the midportion of his bypass just below the level of his umbilicus above this pubic bone. There was no significant surrounding erythema. He is taking to the operating room this time for revision.  The patient was placed in supine position an area of both groins and the lower abdomen and thighs were prepped and draped in usual sterile fashion. Incisions were made near the groin bilaterally over the existing Dacron graft and carried down to isolate the graft and these were encircled. There was no fluid around the graft these were well incorporated this position and no evidence of infection. A tunnel was created superior to the prior existing fem-fem bypass and a new plane. The patient was given 7000 intravenous heparin. After adequate circulation the Dacron graft was occluded in the incision on the right and left near the groin. The Dacron graft was divided. The graft was mobilized further towards the midline and was resected. The tunnel was closed with 2 layers of 3-0 Vicryl at this area to exclude  the midportion of the Dacron graft from the new interposition anastomoses. A millimeter intrarenal Gore-Tex graft was brought through the tunnel. This was slightly spatulated and sewn end to end to the Dacron graft near the right and left groin anastomoses with 6-0 Prolene sutures. Prior to completion of each anastomosis the usual flushing maneuvers were taken. Anastomosis completed and good flow was noted from the right to left fem-fem bypass. The wounds were irrigated with saline and hemostasis daily electrocautery. The patient was given 50 mg of protamine to reverse the heparin. The wounds were closed with 3-0 Vicryl in the subcutaneous and subcuticular tissue.  Next attention was turned to the exposed Dacron in the midline. This was removed by grasping with a hemostat and with the gentle traction was delivered from the wound. There was no evidence of purulence. The Dacron graft and portion were cultured for aerobic culture. The skin edges were freshened up with an ellipse of skin and hemostasis electrocautery. The wounds were irrigated with saline and this midline incision was packed with Dacron soaked 4 x 4. Benzoin and Steri-Strips were applied to the right and left incisions and the patient was transferred to the recovery room in stable condition   [name], M.D. 07/03/2014 10:02 AM

## 2014-07-03 NOTE — Progress Notes (Signed)
Utilization review completed.  

## 2014-07-03 NOTE — Anesthesia Postprocedure Evaluation (Signed)
  Anesthesia Post-op Note  Patient: Stephen Cohen  Procedure(s) Performed: Procedure(s): REVISION OF Right FEMORAL to Left FEMORAL ARTERY BYPASS GRAFT with removal of Eroded graft (Bilateral)  Patient Location: PACU  Anesthesia Type:General  Level of Consciousness: awake, alert  and oriented  Airway and Oxygen Therapy: Patient Spontanous Breathing  Post-op Pain: mild  Post-op Assessment: Post-op Vital signs reviewed, Patient's Cardiovascular Status Stable, Respiratory Function Stable, Patent Airway, No signs of Nausea or vomiting and Pain level controlled  Post-op Vital Signs: Reviewed and stable  Last Vitals:  Filed Vitals:   07/03/14 1247  BP: 120/67  Pulse: 59  Temp: 36.3 C  Resp: 15    Complications: No apparent anesthesia complications

## 2014-07-03 NOTE — Anesthesia Preprocedure Evaluation (Addendum)
Anesthesia Evaluation  Patient identified by MRN, date of birth, ID band Patient awake    Reviewed: Allergy & Precautions, H&P , NPO status , Patient's Chart, lab work & pertinent test results  Airway Mallampati: II TM Distance: >3 FB Neck ROM: Full    Dental  (+) Edentulous Upper, Edentulous Lower, Dental Advisory Given   Pulmonary shortness of breath and with exertion, COPD COPD inhaler, Current Smoker,  breath sounds clear to auscultation        Cardiovascular hypertension, Pt. on medications - angina+ CAD, + Past MI, + Cardiac Stents, + CABG, + Peripheral Vascular Disease and +CHF + pacemaker + Cardiac Defibrillator Rhythm:Regular  Denies recent angina since stents x 2 in 2014 (jan) with pacer ICD (st jude, no shocks), EF unknown   Neuro/Psych    GI/Hepatic Neg liver ROS, hiatal hernia, PUD, GERD-  Medicated and Controlled,  Endo/Other  negative endocrine ROS  Renal/GU Renal InsufficiencyRenal disease     Musculoskeletal   Abdominal   Peds  Hematology negative hematology ROS (+)   Anesthesia Other Findings   Reproductive/Obstetrics                         Anesthesia Physical Anesthesia Plan  ASA: III  Anesthesia Plan: General   Post-op Pain Management:    Induction: Intravenous  Airway Management Planned: Oral ETT  Additional Equipment: None  Intra-op Plan:   Post-operative Plan: Extubation in OR  Informed Consent: I have reviewed the patients History and Physical, chart, labs and discussed the procedure including the risks, benefits and alternatives for the proposed anesthesia with the patient or authorized representative who has indicated his/her understanding and acceptance.     Plan Discussed with: CRNA and Surgeon  Anesthesia Plan Comments:         Anesthesia Quick Evaluation

## 2014-07-03 NOTE — Transfer of Care (Signed)
Immediate Anesthesia Transfer of Care Note  Patient: Stephen Cohen  Procedure(s) Performed: Procedure(s): REVISION OF Right FEMORAL to Left FEMORAL ARTERY BYPASS GRAFT with removal of Eroded graft (Bilateral)  Patient Location: PACU  Anesthesia Type:General  Level of Consciousness: awake and oriented  Airway & Oxygen Therapy: Patient Spontanous Breathing and Patient connected to nasal cannula oxygen  Post-op Assessment: Report given to PACU RN  Post vital signs: Reviewed and stable  Complications: No apparent anesthesia complications

## 2014-07-03 NOTE — Progress Notes (Signed)
OT Cancellation Note  Patient Details Name: Stephen Cohen MRN: [id number] DOB: Jun 04, 1941   Cancelled Treatment:    Reason Eval/Treat Not Completed: OT screened, no needs identified, will sign off  [facility], OTR/L  [phone] 07/03/2014 07/03/2014, 4:36 PM

## 2014-07-03 NOTE — Anesthesia Procedure Notes (Signed)
Procedure Name: Intubation Date/Time: 07/03/2014 8:06 AM Performed by: Jefm Miles E Pre-anesthesia Checklist: Patient identified, Emergency Drugs available, Suction available, Patient being monitored and Timeout performed Patient Re-evaluated:Patient Re-evaluated prior to inductionOxygen Delivery Method: Circle system utilized Preoxygenation: Pre-oxygenation with 100% oxygen Intubation Type: IV induction Ventilation: Mask ventilation without difficulty and Oral airway inserted - appropriate to patient size Laryngoscope Size: Mac and 3 Grade View: Grade I Tube type: Oral Tube size: 7.5 mm Number of attempts: 1 Airway Equipment and Method: Stylet Placement Confirmation: ETT inserted through vocal cords under direct vision,  positive ETCO2 and breath sounds checked- equal and bilateral Secured at: 22 cm Tube secured with: Tape Dental Injury: Teeth and Oropharynx as per pre-operative assessment

## 2014-07-03 NOTE — H&P (View-Only) (Signed)
Patient name: Stephen Cohen MRN: [id number] DOB: Apr 20, 1941 Sex: male    Reason for referral:  Chief Complaint  Patient presents with  . Re-evaluation    wound check c/o increased drainage and some pain     HISTORY OF PRESENT ILLNESS: Patient has today for followup of his femoral-femoral bypass. He is very complex history with initial placement of the fem-fem bypass greater than 20 years ago. He had a prior episode of erosion of this graft and had replacement. Most recently in January 2015 he presented with severe claudication with recurrent occlusion of the graft which had been present for several months. He underwent resection and replacement of this in February with her and consent graft. On my last visit with him in April he continued with groin incisions and did have a slight area of erythema over the midportion where his belt rubbed this area. He reports that he did develop blistering there is some drainage of this for approximately 2 months. He has no claudication symptoms. He has no symptoms of sepsis  Past Medical History  Diagnosis Date  . Hypertension   . Hyperlipidemia   . Myocardial infarction 1995  . COPD (chronic obstructive pulmonary disease)   . GERD (gastroesophageal reflux disease)   . Hiatal hernia   . Peripheral vascular disease   . Arthritis   . Peptic ulcer disease   . Coronary artery disease   . Anginal pain     no pain lately  . Shortness of breath   . Pacemaker   . Automatic implantable cardioverter-defibrillator in situ   . HOH (hard of hearing)   . Ringing in ears     Past Surgical History  Procedure Laterality Date  . Pr vein bypass graft,aorto-fem-pop  1995  2005    Right to left Fem-Fem with revision   . Appendectomy    . Coronary artery bypass graft  1995  . Left heart cath Left 11-11-12  . Pace maker  February 01, 2013  . Coronary angioplasty    . Cornary stents  11/2012  . Femoral-femoral bypass graft N/A 12/12/2013    Procedure: BYPASS  GRAFT FEMORAL-FEMORAL ARTERY-  RIGHT TO LEFT using rifampin soaked hemashield graft;  Surgeon: Larina Earthly, MD;  Location: [facility] OR;  Service: Vascular;  Laterality: N/A;  . Removal of graft Bilateral 12/12/2013    Procedure: REMOVAL OF Femoral- femoral hemashield GRAFT;  Surgeon: Larina Earthly, MD;  Location: [facility] OR;  Service: Vascular;  Laterality: Bilateral;    History   Social History  . Marital Status: Married    Spouse Name: N/A    Number of Children: N/A  . Years of Education: N/A   Occupational History  . Not on file.   Social History Main Topics  . Smoking status: Current Every Day Smoker -- 0.75 packs/day for 65 years    Types: Cigarettes  . Smokeless tobacco: Never Used     Comment: pt states that he is not going to quit  . Alcohol Use: No  . Drug Use: No  . Sexual Activity: Not on file   Other Topics Concern  . Not on file   Social History Narrative  . No narrative on file    Family History  Problem Relation Age of Onset  . Heart disease Mother   . Hyperlipidemia Mother   . Heart attack Mother   . Heart disease Father   . Hyperlipidemia Father   . Cancer Sister   .  Heart disease Sister   . Hyperlipidemia Sister   . Heart attack Sister   . Peripheral vascular disease Sister     Allergies as of 06/20/2014  . (No Known Allergies)    Current Outpatient Prescriptions on File Prior to Visit  Medication Sig Dispense Refill  . albuterol-ipratropium (COMBIVENT) 18-103 MCG/ACT inhaler Inhale 2 puffs into the lungs every 6 (six) hours as needed for shortness of breath. For shortness of breath      . amLODipine (NORVASC) 10 MG tablet Take 5 mg by mouth daily. Halt Tab per day      . aspirin EC 81 MG tablet Take 81 mg by mouth daily.      Marland Kitchen atorvastatin (LIPITOR) 10 MG tablet Take 10 mg by mouth daily.      . cephALEXin (KEFLEX) 500 MG capsule Take 1 capsule (500 mg total) by mouth 3 (three) times daily.  21 capsule  0  . diphenhydrAMINE (BENADRYL) 25 mg capsule Take  25 mg by mouth daily as needed (for running nose).      . Esomeprazole Magnesium (NEXIUM PO) Take 1 tablet by mouth daily before breakfast.      . furosemide (LASIX) 40 MG tablet Take 40 mg by mouth daily.      . hydrALAZINE (APRESOLINE) 25 MG tablet Take 12.5 mg by mouth 3 (three) times daily. 1/2 tablet tid. Cutting down on medication due to heart rate per patient      . isosorbide mononitrate (IMDUR) 120 MG 24 hr tablet Take 120 mg by mouth daily.      Marland Kitchen losartan (COZAAR) 25 MG tablet Take 25 mg by mouth daily.      . metoprolol succinate (TOPROL-XL) 25 MG 24 hr tablet Take 50 mg by mouth daily.      . niacin (SLO-NIACIN) 500 MG tablet Take 500 mg by mouth 2 (two) times daily with a meal.      . nitroGLYCERIN (NITROSTAT) 0.4 MG SL tablet Place 0.4 mg under the tongue every 5 (five) minutes as needed. For chest pain      . oxyCODONE-acetaminophen (PERCOCET/ROXICET) 5-325 MG per tablet Take 1 tablet by mouth every 6 (six) hours as needed.  30 tablet  0  . potassium chloride SA (K-DUR,KLOR-CON) 20 MEQ tablet Take 20 mEq by mouth 2 (two) times daily.      Marland Kitchen allopurinol (ZYLOPRIM) 100 MG tablet Take 100 mg by mouth daily. For gout       No current facility-administered medications on file prior to visit.       PHYSICAL EXAMINATION:  General: The patient is a well-nourished male, in no acute distress. Vital signs are BP 149/70  Pulse 58  Temp(Src) 97.4 F (36.3 C) (Oral)  Ht  (1.803 m)  Wt 163 lb (73.936 kg)  BMI 22.74 kg/m2  SpO2 96% Pulmonary: There is a good air exchange  Abdomen: Soft and non-tender with normal pitch bowel sounds. Does have an area of graft exposure in the midline of his fem-fem bypass. Musculoskeletal: There are no major deformities.  There is no significant extremity pain. Neurologic: No focal weakness or paresthesias are detected, Skin: There are no ulcer or rashes noted. Psychiatric: The patient has normal affect. Cardiovascular: 2+ femoral pulses and  2+ dorsalis pedis pulses bilaterally. No evidence of groin involvement from his any evidence of infection or fluctuance.    Impression and Plan:  Patent fem-fem bypass with erosion in the midportion with exposed Dacron graft. At long discussion  with the patient explaining the importance for resection of this area to prevent complete involvement of the entire. Hopefully can avoid reexploration of his groins at this is not involved and simply placed in a position of graft around this area. We will schedule this for August 24 at his convenience. He has a health care issues with his wife how to prevent earlier treatment. This has been present for several months Comfortable with the waiting until the 24th    EARLY, TODD [facility] [place] Office: [phone]

## 2014-07-03 NOTE — Telephone Encounter (Addendum)
Message copied by Rosalyn Charters on Mon Jul 03, 2014  3:00 PM ------      Message from: [place], [place] K      Created: Mon Jul 03, 2014  9:41 AM      Regarding: Schedule                   ----- Message -----         From: Dara Lords, PA-C         Sent: 07/03/2014   9:38 AM           To: Vvs Charge Pool            S/p replacement of portion of fem fem bypass grafting 07/03/14.  F/u with TFE in 2 weeks.            Thanks,      Lelon Mast ------  notified patient's wife of post op appt. on 07-18-14 8:30

## 2014-07-03 NOTE — Interval H&P Note (Signed)
History and Physical Interval Note:  07/03/2014 6:51 AM  Stephen Cohen  has presented today for surgery, with the diagnosis of Other complications due to other cardiac device, implant, and graft   The various methods of treatment have been discussed with the patient and family. After consideration of risks, benefits and other options for treatment, the patient has consented to  Procedure(s): REVISION OF FEMORAL-FEMORAL ARTERY BYPASS GRAFT (N/A) as a surgical intervention .  The patient's history has been reviewed, patient examined, no change in status, stable for surgery.  I have reviewed the patient's chart and labs.  Questions were answered to the patient's satisfaction.     Elonna Mcfarlane

## 2014-07-03 NOTE — Progress Notes (Signed)
  [facility] Progress Note  07/03/2014 2:53 PM Day of Surgery  Subjective: Ambulating in the room. No complaints.   Tmax 97.7 BP sys 110s-160s 02 98% RA  Filed Vitals:   07/03/14 1247  BP: 120/67  Pulse: 59  Temp: 97.4 F (36.3 C)  Resp: 15    Physical Exam: Incisions:  Lower abdominal incision dressed. Dressing is clean.  Extremities:  Palpable 2+  dorsalis pedis pulses bilaterally.   CBC    Component Value Date/Time   WBC 10.8* 06/29/2014 0859   RBC 4.74 06/29/2014 0859   HGB 13.3 06/29/2014 0859   HCT 41.8 06/29/2014 0859   PLT 250 06/29/2014 0859   MCV 88.2 06/29/2014 0859   MCH 28.1 06/29/2014 0859   MCHC 31.8 06/29/2014 0859   RDW 13.8 06/29/2014 0859   LYMPHSABS 4.2* 11/11/2012 2139   MONOABS 1.0 11/11/2012 2139   EOSABS 0.2 11/11/2012 2139   BASOSABS 0.1 11/11/2012 2139    BMET    Component Value Date/Time   NA 140 06/29/2014 0859   K 4.1 06/29/2014 0859   CL 103 06/29/2014 0859   CO2 24 06/29/2014 0859   GLUCOSE 93 06/29/2014 0859   BUN 11 06/29/2014 0859   CREATININE 1.19 06/29/2014 0859   CALCIUM 9.1 06/29/2014 0859   GFRNONAA 59* 06/29/2014 0859   GFRAA 68* 06/29/2014 0859    INR    Component Value Date/Time   INR 1.05 06/29/2014 0859     Intake/Output Summary (Last 24 hours) at 07/03/14 1453 Last data filed at 07/03/14 1433  Gross per 24 hour  Intake   1230 ml  Output    400 ml  Net    830 ml     Assessment:  73 y.o. male is s/p: Revision of right to left fem-fem bypass with interposition Gore-Tex and removal of exposed Dacron graft  Day of Surgery  Plan: -Palpable DP pulses bilaterally.  -Wound cultures pending.  -Mobilize. -Dispo: Anticipate discharge tomorrow.   Maris Berger, PA-C [facility] Office: [phone] Pager: [phone] 07/03/2014 2:53 PM

## 2014-07-03 NOTE — Progress Notes (Signed)
Patients step daughter Stephen Cohen approached Nurse and informed me that she was going to work and would not be able to keep patient belongings. Stephen Cohen handed Nurse one tied patient belongings bag. Nurse then took the bag to PACU.

## 2014-07-04 ENCOUNTER — Encounter (HOSPITAL_COMMUNITY): Payer: Self-pay | Admitting: Vascular Surgery

## 2014-07-04 ENCOUNTER — Other Ambulatory Visit: Payer: Self-pay | Admitting: *Deleted

## 2014-07-04 DIAGNOSIS — I739 Peripheral vascular disease, unspecified: Secondary | ICD-10-CM

## 2014-07-04 DIAGNOSIS — Z48812 Encounter for surgical aftercare following surgery on the circulatory system: Secondary | ICD-10-CM

## 2014-07-04 LAB — BASIC METABOLIC PANEL
ANION GAP: 13 (ref 5–15)
BUN: 9 mg/dL (ref 6–23)
CALCIUM: 8.8 mg/dL (ref 8.4–10.5)
CO2: 23 mEq/L (ref 19–32)
CREATININE: 1.1 mg/dL (ref 0.50–1.35)
Chloride: 101 mEq/L (ref 96–112)
GFR calc Af Amer: 75 mL/min — ABNORMAL LOW (ref >90)
GFR calc non Af Amer: 65 mL/min — ABNORMAL LOW (ref >90)
GLUCOSE: 100 mg/dL — AB (ref 70–99)
Potassium: 3.8 mEq/L (ref 3.7–5.3)
Sodium: 137 mEq/L (ref 137–147)

## 2014-07-04 LAB — CBC
HCT: 40.5 % (ref 39.0–52.0)
Hemoglobin: 13.5 g/dL (ref 13.0–17.0)
MCH: 29.2 pg (ref 26.0–34.0)
MCHC: 33.3 g/dL (ref 30.0–36.0)
MCV: 87.7 fL (ref 78.0–100.0)
PLATELETS: 233 10*3/uL (ref 150–400)
RBC: 4.62 MIL/uL (ref 4.22–5.81)
RDW: 13.9 % (ref 11.5–15.5)
WBC: 10.7 10*3/uL — ABNORMAL HIGH (ref 4.0–10.5)

## 2014-07-04 NOTE — Discharge Summary (Signed)
Discharge Summary     Stephen Cohen November 05, 1941 73 y.o. male  [id number]  Admission Date: 07/03/2014  Discharge Date: 07/04/14  Physician: Larina Earthly, MD  Admission Diagnosis: Other complications due to other cardiac device, implant, and graft    HPI:   This is a 73 y.o. male who has today for followup of his femoral-femoral bypass. He is very complex history with initial placement of the fem-fem bypass greater than 20 years ago. He had a prior episode of erosion of this graft and had replacement. Most recently in January 2015 he presented with severe claudication with recurrent occlusion of the graft which had been present for several months. He underwent resection and replacement of this in February with her and consent graft. On my last visit with him in April he continued with groin incisions and did have a slight area of erythema over the midportion where his belt rubbed this area. He reports that he did develop blistering there is some drainage of this for approximately 2 months. He has no claudication symptoms. He has no symptoms of sepsis  Hospital Course:  The patient was admitted to the hospital and taken to the operating room on 07/03/2014 and underwent: Revision of right to left fem-fem bypass with interposition Gore-Tex and removal of exposed Dacron graft    The pt tolerated the procedure well and was transported to the PACU in good condition. By POD 1, he is doing well.  He has bilateral palpable pedal pulses.  His incisions are c/d/i.  The area where his graft had eroded through was clean with minimal bloody ooze.    Graft/Wound Cx on POD 1 are as follows: Gram Stain  RARE WBC PRESENT,BOTH PMN AND MONONUCLEAR NO SQUAMOUS EPITHELIAL CELLS SEEN NO ORGANISMS SEEN Performed at [facility]   Culture  NO GROWTH 1 DAY    He did not have ABI's on POD 1 as his only transportation home was early am.  We will schedule these when he comes back to clinic for his  follow up appt.  The remainder of the hospital course consisted of increasing mobilization and increasing intake of solids without difficulty.  CBC    Component Value Date/Time   WBC 10.7* 07/04/2014 0318   RBC 4.62 07/04/2014 0318   HGB 13.5 07/04/2014 0318   HCT 40.5 07/04/2014 0318   PLT 233 07/04/2014 0318   MCV 87.7 07/04/2014 0318   MCH 29.2 07/04/2014 0318   MCHC 33.3 07/04/2014 0318   RDW 13.9 07/04/2014 0318   LYMPHSABS 4.2* 11/11/2012 2139   MONOABS 1.0 11/11/2012 2139   EOSABS 0.2 11/11/2012 2139   BASOSABS 0.1 11/11/2012 2139    BMET    Component Value Date/Time   NA 137 07/04/2014 0318   K 3.8 07/04/2014 0318   CL 101 07/04/2014 0318   CO2 23 07/04/2014 0318   GLUCOSE 100* 07/04/2014 0318   BUN 9 07/04/2014 0318   CREATININE 1.10 07/04/2014 0318   CALCIUM 8.8 07/04/2014 0318   GFRNONAA 65* 07/04/2014 0318   GFRAA 75* 07/04/2014 0318     Discharge Instructions:   The patient is discharged with extensive instructions on wound care and progressive ambulation.  They are instructed not to drive or perform any heavy lifting until returning to see the physician in his office.  Discharge Instructions   Call MD for:  redness, tenderness, or signs of infection (pain, swelling, bleeding, redness, odor or green/yellow discharge around incision site)    Complete  by:  As directed      Call MD for:  severe or increased pain, loss or decreased feeling  in affected limb(s)    Complete by:  As directed      Call MD for:  temperature >100.5    Complete by:  As directed      Discharge wound care:    Complete by:  As directed   Shower daily with soap and water starting 07/05/14     Driving Restrictions    Complete by:  As directed   No driving for 2 weeks     Lifting restrictions    Complete by:  As directed   No lifting for 4 weeks     Resume previous diet    Complete by:  As directed            Discharge Diagnosis:  Other complications due to other cardiac device, implant, and graft    Secondary Diagnosis: Patient Active Problem List   Diagnosis Date Noted  . Erosion of graft 07/03/2014  . Visit for wound check 06/20/2014  . Peripheral vascular disease, unspecified 02/14/2014  . Post op infection 12/21/2013  . PAD (peripheral artery disease) 12/12/2013  . Dizziness and giddiness 08/30/2013  . Swelling of limb-Bilateral leg 08/30/2013  . Pain in limb-Left leg 08/30/2013  . Nonpalpable pulse-Left groin area 08/30/2013  . Unstable angina 11/11/2012  . Systolic and diastolic CHF, chronic 11/11/2012  . S/P PTCA (percutaneous transluminal coronary angioplasty) 11/11/2012  . Atherosclerosis of native arteries of the extremities with intermittent claudication 08/20/2012  . Aftercare following surgery of the circulatory system, NEC 08/20/2012   Past Medical History  Diagnosis Date  . Hypertension   . Hyperlipidemia   . Myocardial infarction 1995  . COPD (chronic obstructive pulmonary disease)   . GERD (gastroesophageal reflux disease)   . Hiatal hernia   . Peripheral vascular disease   . Arthritis   . Peptic ulcer disease   . Coronary artery disease   . Anginal pain     no pain lately  . Shortness of breath   . Pacemaker   . Automatic implantable cardioverter-defibrillator in situ   . HOH (hard of hearing)   . Ringing in ears   . Cataracts, bilateral        Medication List         albuterol-ipratropium 18-103 MCG/ACT inhaler  Commonly known as:  COMBIVENT  Inhale 2 puffs into the lungs every 6 (six) hours as needed for shortness of breath. For shortness of breath     amLODipine 10 MG tablet  Commonly known as:  NORVASC  Take 10 mg by mouth daily.     aspirin EC 81 MG tablet  Take 81 mg by mouth daily.     atorvastatin 10 MG tablet  Commonly known as:  LIPITOR  Take 10 mg by mouth at bedtime.     cephALEXin 500 MG capsule  Commonly known as:  KEFLEX  Take 1 capsule (500 mg total) by mouth 3 (three) times daily.     diphenhydrAMINE 25 mg  capsule  Commonly known as:  BENADRYL  Take 50 mg by mouth daily as needed (for running nose).     esomeprazole 20 MG capsule  Commonly known as:  NEXIUM  Take 20 mg by mouth daily.     furosemide 40 MG tablet  Commonly known as:  LASIX  Take 40 mg by mouth daily.     hydrALAZINE 25 MG tablet  Commonly known as:  APRESOLINE  Take 12.5 mg by mouth 3 (three) times daily. Cutting down on medication due to heart rate per patient     losartan 25 MG tablet  Commonly known as:  COZAAR  Take 25 mg by mouth daily.     metoprolol succinate 50 MG 24 hr tablet  Commonly known as:  TOPROL-XL  Take 50 mg by mouth daily. Take with or immediately following a meal.     nitroGLYCERIN 0.4 MG SL tablet  Commonly known as:  NITROSTAT  Place 0.4 mg under the tongue every 5 (five) minutes as needed. For chest pain     oxycodone 5 MG capsule  Commonly known as:  OXY-IR  Take 1 capsule (5 mg total) by mouth every 6 (six) hours as needed.     potassium chloride SA 20 MEQ tablet  Commonly known as:  K-DUR,KLOR-CON  Take 20 mEq by mouth daily.        Roxicodone #30 No Refill Keflex 500 mg #42 No Refill   Disposition: home  Patient's condition: is Good  Follow up: 1. Dr. Arbie Cookey  in 2 weeks   Doreatha Massed, PA-C [facility] [phone] 07/04/2014  7:49 AM  - For VQI Registry use --- Instructions: Press F2 to tab through selections.  Delete question if not applicable.   Post-op:  Wound infection: No  Graft infection: No  Transfusion: No  If yes, n/a units given New Arrhythmia: No Ipsilateral amputation: No,  Minor,  BKA,  AKA Discharge patency:  Primary, [ x] Primary assisted,  Secondary,  Occluded Patency judged by:  Dopper only, [x ] Palpable graft pulse, [x ] Palpable distal pulse,  ABI inc. > 0.15,  Duplex Discharge ABI: R not done, L not done Discharge TBI: R , L  D/C Ambulatory Status: Ambulatory  Complications: MI: No,   Troponin only,  EKG or Clinical CHF: No Resp failure:No,  Pneumonia,  Ventilator Chg in renal function: No,  Inc. Cr > 0.5,  Temp. Dialysis,  Permanent dialysis Stroke: No,  Minor,  Major Return to OR: No  Reason for return to OR:  Bleeding,  Infection,  Thrombosis,  Revision  Discharge medications: Statin use:  yes ASA use:  yes Plavix use:  no Beta blocker use: yes Coumadin use: no

## 2014-07-04 NOTE — Progress Notes (Signed)
Stephen Cohen to be D/C'd Home per MD order.  Discussed with the patient and all questions fully answered.    Medication List         albuterol-ipratropium 18-103 MCG/ACT inhaler  Commonly known as:  COMBIVENT  Inhale 2 puffs into the lungs every 6 (six) hours as needed for shortness of breath. For shortness of breath     amLODipine 10 MG tablet  Commonly known as:  NORVASC  Take 10 mg by mouth daily.     aspirin EC 81 MG tablet  Take 81 mg by mouth daily.     atorvastatin 10 MG tablet  Commonly known as:  LIPITOR  Take 10 mg by mouth at bedtime.     cephALEXin 500 MG capsule  Commonly known as:  KEFLEX  Take 1 capsule (500 mg total) by mouth 3 (three) times daily.     diphenhydrAMINE 25 mg capsule  Commonly known as:  BENADRYL  Take 50 mg by mouth daily as needed (for running nose).     esomeprazole 20 MG capsule  Commonly known as:  NEXIUM  Take 20 mg by mouth daily.     furosemide 40 MG tablet  Commonly known as:  LASIX  Take 40 mg by mouth daily.     hydrALAZINE 25 MG tablet  Commonly known as:  APRESOLINE  Take 12.5 mg by mouth 3 (three) times daily. Cutting down on medication due to heart rate per patient     losartan 25 MG tablet  Commonly known as:  COZAAR  Take 25 mg by mouth daily.     metoprolol succinate 50 MG 24 hr tablet  Commonly known as:  TOPROL-XL  Take 50 mg by mouth daily. Take with or immediately following a meal.     nitroGLYCERIN 0.4 MG SL tablet  Commonly known as:  NITROSTAT  Place 0.4 mg under the tongue every 5 (five) minutes as needed. For chest pain     oxycodone 5 MG capsule  Commonly known as:  OXY-IR  Take 1 capsule (5 mg total) by mouth every 6 (six) hours as needed.     potassium chloride SA 20 MEQ tablet  Commonly known as:  K-DUR,KLOR-CON  Take 20 mEq by mouth daily.        VVS, Skin clean, dry and intact without evidence of skin break down, no evidence of skin tears noted. IV catheter discontinued intact. Site  without signs and symptoms of complications. Dressing and pressure applied.  An After Visit Summary was printed and given to the patient.  D/c education completed with patient/family including follow up instructions, medication list, d/c activities limitations if indicated, with other d/c instructions as indicated by MD - patient able to verbalize understanding, all questions fully answered.   Patient instructed to return to ED, call 911, or call MD for any changes in condition.   Patient escorted via WC, and D/C home via private auto.  [name] [facility] 07/04/2014 8:05 AM

## 2014-07-04 NOTE — Progress Notes (Signed)
  Progress Note    07/04/2014 7:21 AM 1 Day Post-Op  Subjective:  Ready to go home  Afebrile HR 50-70's regular 100's-130's systolic 97% 2LO2NC  Filed Vitals:   07/04/14 0337  BP: 131/64  Pulse: 71  Temp:   Resp: 14    Physical Exam: Cardiac:  Regular Lungs:  Non labored Incisions:  Bilateral incisions are c/d/i with steri strips in tact; open wound with minimal bloody drainage, but clean. Extremities:  Bilateral feet are warm;  2+ DP bilaterally 1+ PT bilaterally; + palpable graft pulse  CBC    Component Value Date/Time   WBC 10.7* 07/04/2014 0318   RBC 4.62 07/04/2014 0318   HGB 13.5 07/04/2014 0318   HCT 40.5 07/04/2014 0318   PLT 233 07/04/2014 0318   MCV 87.7 07/04/2014 0318   MCH 29.2 07/04/2014 0318   MCHC 33.3 07/04/2014 0318   RDW 13.9 07/04/2014 0318   LYMPHSABS 4.2* 11/11/2012 2139   MONOABS 1.0 11/11/2012 2139   EOSABS 0.2 11/11/2012 2139   BASOSABS 0.1 11/11/2012 2139    BMET    Component Value Date/Time   NA 137 07/04/2014 0318   K 3.8 07/04/2014 0318   CL 101 07/04/2014 0318   CO2 23 07/04/2014 0318   GLUCOSE 100* 07/04/2014 0318   BUN 9 07/04/2014 0318   CREATININE 1.10 07/04/2014 0318   CALCIUM 8.8 07/04/2014 0318   GFRNONAA 65* 07/04/2014 0318   GFRAA 75* 07/04/2014 0318    INR    Component Value Date/Time   INR 1.05 06/29/2014 0859     Intake/Output Summary (Last 24 hours) at 07/04/14 0721 Last data filed at 07/04/14 0340  Gross per 24 hour  Intake 1848.5 ml  Output   2600 ml  Net -751.5 ml    Femoral Graft wound Gram Stain and Culture 07/03/14 Gram Stain  RARE WBC PRESENT,BOTH PMN AND MONONUCLEAR NO SQUAMOUS EPITHELIAL CELLS SEEN NO ORGANISMS SEEN Performed at [facility]  Culture  NO GROWTH 1 DAY Performed at [facility]     Assessment:  73 y.o. male is s/p:  Revision of right to left fem-fem bypass with interposition Gore-Tex and removal of exposed Dacron graft  1 Day Post-Op  Plan: -pt doing well this am-feet  with palpable pulses bilaterally; -wound/graft culture negative thus far, but will discharge pt on Keflex 500 mg tid x 2 weeks. -pt voiding well -discharge home this am-pt has ride arranged for early this morning, so I will inform office of need for ABI's at his f/u visit. -DVT prophylaxis:  Pt to discharge early this am -will have pt do wet to dry dressing changes at home bid.  Will order home health RN to check wound and perform dressing changes. -pt has f/u appt with Dr. Arbie Cookey on 07/18/14 @ 0830   Doreatha Massed, PA-C [facility] [phone] 07/04/2014 7:21 AM

## 2014-07-04 NOTE — Progress Notes (Signed)
PT Cancellation Note  Patient Details Name: Stephen Cohen MRN: [id number] DOB: Mar 30, 1941   Cancelled Treatment:    Reason Eval/Treat Not Completed: PT screened, no needs identified, will sign off (spoke with OT, no needs at this time)   Fabio Asa 07/04/2014, 7:49 AM Charlotte Crumb, PT DPT  [phone]

## 2014-07-04 NOTE — Care Management Note (Signed)
    Page 1 of 1   07/04/2014     10:37:06 AM CARE MANAGEMENT NOTE 07/04/2014  Patient:  Stephen Cohen, Stephen Cohen   Account Number:  [id number]  Date Initiated:  07/04/2014  Documentation initiated by:  Donn Pierini  Subjective/Objective Assessment:   Pt admitted s/p REVISION OF FEMORAL-FEMORAL ARTERY BYPASS GRAFT     Action/Plan:   PTA pt lived at home   Anticipated DC Date:  07/04/2014   Anticipated DC Plan:  HOME/SELF CARE         Choice offered to / List presented to:             Status of service:  Completed, signed off Medicare Important Message given?  NA - LOS <3 / Initial given by admissions (If response is "NO", the following Medicare IM given date fields will be blank) Date Medicare IM given:   Medicare IM given by:   Date Additional Medicare IM given:   Additional Medicare IM given by:    Discharge Disposition:  HOME/SELF CARE  Per UR Regulation:  Reviewed for med. necessity/level of care/duration of stay  If discussed at Long Length of Stay Meetings, dates discussed:    Comments:

## 2014-07-05 LAB — WOUND CULTURE: CULTURE: NO GROWTH

## 2014-07-14 ENCOUNTER — Encounter: Payer: Self-pay | Admitting: Vascular Surgery

## 2014-07-18 ENCOUNTER — Ambulatory Visit (INDEPENDENT_AMBULATORY_CARE_PROVIDER_SITE_OTHER): Payer: Medicare Other | Admitting: Vascular Surgery

## 2014-07-18 ENCOUNTER — Encounter: Payer: Self-pay | Admitting: Vascular Surgery

## 2014-07-18 ENCOUNTER — Ambulatory Visit (HOSPITAL_COMMUNITY)
Admission: RE | Admit: 2014-07-18 | Discharge: 2014-07-18 | Disposition: A | Discharge status: Home / Self Care | Payer: Medicare Other | Source: Ambulatory Visit | Attending: Vascular Surgery | Admitting: Vascular Surgery

## 2014-07-18 ENCOUNTER — Ambulatory Visit: Payer: Medicare Other | Admitting: Vascular Surgery

## 2014-07-18 VITALS — BP 128/64 | HR 50 | Temp 97.7°F | Resp 14 | Ht 71.0 in | Wt 162.5 lb

## 2014-07-18 DIAGNOSIS — I739 Peripheral vascular disease, unspecified: Secondary | ICD-10-CM | POA: Diagnosis not present

## 2014-07-18 DIAGNOSIS — I10 Essential (primary) hypertension: Secondary | ICD-10-CM | POA: Diagnosis not present

## 2014-07-18 DIAGNOSIS — Z4889 Encounter for other specified surgical aftercare: Secondary | ICD-10-CM

## 2014-07-18 DIAGNOSIS — E785 Hyperlipidemia, unspecified: Secondary | ICD-10-CM | POA: Insufficient documentation

## 2014-07-18 DIAGNOSIS — Z48812 Encounter for surgical aftercare following surgery on the circulatory system: Secondary | ICD-10-CM | POA: Diagnosis not present

## 2014-07-18 NOTE — Progress Notes (Signed)
POST OPERATIVE OFFICE NOTE    CC:  F/u for surgery  HPI:  This is a 73 y.o. male who is s/p right to left femoral to femoral bypass graft revision on 07/03/14.   This was initially placed more than 20 years ago.  He did have a prior erosion of this graft with replacement.  Back in January, he did have severe claudication with recurrent occlusion of the graft and underwent resection and replacement in February 2015.  He states that he has done well since discharge from the hospital and has packed the wound where the graft was removed daily.  He reports this has gotten considerably smaller.  No Known Allergies  Current Outpatient Prescriptions  Medication Sig Dispense Refill  . albuterol-ipratropium (COMBIVENT) 18-103 MCG/ACT inhaler Inhale 2 puffs into the lungs every 6 (six) hours as needed for shortness of breath. For shortness of breath      . amLODipine (NORVASC) 10 MG tablet Take 10 mg by mouth daily.       Marland Kitchen aspirin EC 81 MG tablet Take 81 mg by mouth daily.      Marland Kitchen atorvastatin (LIPITOR) 10 MG tablet Take 10 mg by mouth at bedtime.       . cephALEXin (KEFLEX) 500 MG capsule Take 1 capsule (500 mg total) by mouth 3 (three) times daily.  42 capsule  0  . diphenhydrAMINE (BENADRYL) 25 mg capsule Take 50 mg by mouth daily as needed (for running nose).       Marland Kitchen esomeprazole (NEXIUM) 20 MG capsule Take 20 mg by mouth daily.       . furosemide (LASIX) 40 MG tablet Take 40 mg by mouth daily.      . hydrALAZINE (APRESOLINE) 25 MG tablet Take 12.5 mg by mouth 3 (three) times daily. Cutting down on medication due to heart rate per patient      . losartan (COZAAR) 25 MG tablet Take 25 mg by mouth daily.      . metoprolol succinate (TOPROL-XL) 50 MG 24 hr tablet Take 50 mg by mouth daily. Take with or immediately following a meal.      . nitroGLYCERIN (NITROSTAT) 0.4 MG SL tablet Place 0.4 mg under the tongue every 5 (five) minutes as needed. For chest pain      . oxycodone (OXY-IR) 5 MG capsule Take  1 capsule (5 mg total) by mouth every 6 (six) hours as needed.  30 capsule  0  . potassium chloride SA (K-DUR,KLOR-CON) 20 MEQ tablet Take 20 mEq by mouth daily.        No current facility-administered medications for this visit.     ROS:  See HPI  Physical Exam:  Filed Vitals:   07/18/14 1544  BP: 128/64  Pulse: 50  Temp: 97.7 F (36.5 C)  Resp: 14    Incision:  Are well healed bilaterally; the midline incision is healing nicely and has closed a considerable amount. Extremities:  + palpable thrill within the graft.  ABI's 07/18/14: Right:  0.99 Left:  1.07  A/P:  This is a 73 y.o. male here for f/u for revision of femoral to femoral bypass graft revision 07/03/14.  -pt is doing well with patent femoral to femoral bypass graft.  He will continue to pack wound where graft was removed, but anticipate he will not need to do this much longer as it is considerably smaller.   -ABI's are normal today -he will f/u with [place] as needed.   Doreatha Massed, PA-C  [facility] [phone]  Clinic MD:  Pt seen and examined with Dr. Arbie Cookey  I have examined the patient, reviewed and agree with above.  EARLY, TODD, MD 07/18/2014 5:22 PM

## 2014-10-19 ENCOUNTER — Encounter (HOSPITAL_COMMUNITY): Payer: Self-pay | Admitting: Cardiology

## 2015-06-07 DIAGNOSIS — Z95810 Presence of automatic (implantable) cardiac defibrillator: Secondary | ICD-10-CM | POA: Insufficient documentation

## 2015-06-07 DIAGNOSIS — I420 Dilated cardiomyopathy: Secondary | ICD-10-CM

## 2015-06-07 DIAGNOSIS — I255 Ischemic cardiomyopathy: Secondary | ICD-10-CM

## 2015-06-07 HISTORY — DX: Dilated cardiomyopathy: I42.0

## 2015-06-07 HISTORY — DX: Ischemic cardiomyopathy: I25.5

## 2015-12-19 ENCOUNTER — Encounter: Payer: Self-pay | Admitting: Cardiology

## 2016-01-08 DIAGNOSIS — Z7901 Long term (current) use of anticoagulants: Secondary | ICD-10-CM | POA: Insufficient documentation

## 2016-01-08 DIAGNOSIS — J439 Emphysema, unspecified: Secondary | ICD-10-CM | POA: Insufficient documentation

## 2016-01-08 DIAGNOSIS — I2510 Atherosclerotic heart disease of native coronary artery without angina pectoris: Secondary | ICD-10-CM

## 2016-01-08 DIAGNOSIS — I480 Paroxysmal atrial fibrillation: Secondary | ICD-10-CM | POA: Insufficient documentation

## 2016-01-08 DIAGNOSIS — I10 Essential (primary) hypertension: Secondary | ICD-10-CM

## 2016-01-08 HISTORY — DX: Atherosclerotic heart disease of native coronary artery without angina pectoris: I25.10

## 2016-01-08 HISTORY — DX: Paroxysmal atrial fibrillation: I48.0

## 2016-01-08 HISTORY — DX: Essential (primary) hypertension: I10

## 2016-03-15 DIAGNOSIS — R079 Chest pain, unspecified: Secondary | ICD-10-CM | POA: Insufficient documentation

## 2017-06-24 DIAGNOSIS — Z955 Presence of coronary angioplasty implant and graft: Secondary | ICD-10-CM | POA: Insufficient documentation

## 2017-06-24 DIAGNOSIS — E785 Hyperlipidemia, unspecified: Secondary | ICD-10-CM | POA: Insufficient documentation

## 2017-06-24 DIAGNOSIS — Z951 Presence of aortocoronary bypass graft: Secondary | ICD-10-CM | POA: Insufficient documentation

## 2017-07-09 DIAGNOSIS — Z8719 Personal history of other diseases of the digestive system: Secondary | ICD-10-CM | POA: Insufficient documentation

## 2017-07-30 ENCOUNTER — Telehealth: Payer: Self-pay

## 2017-07-30 NOTE — Telephone Encounter (Signed)
Returned call to pt, c/o intermittent lower abdominal pain, stated he has been seen by several doctors but have not been able to get his two hernia repaired in lower abdominal region. Stated this has been going on for 6-8 months. Pt seen Dr. Vinson Moselle @ [facility] 8/30 and he recommended the patient referral to a tertiary care center for vascular surgery assistance during hernia repair. Pt stated that he wanted to see Dr. Arbie Cookey to discuss this with him, appointment was made for 09/08/17.

## 2017-09-08 ENCOUNTER — Encounter: Payer: Self-pay | Admitting: Vascular Surgery

## 2017-09-08 ENCOUNTER — Ambulatory Visit (INDEPENDENT_AMBULATORY_CARE_PROVIDER_SITE_OTHER): Payer: Medicare Other | Admitting: Vascular Surgery

## 2017-09-08 VITALS — BP 118/58 | HR 61 | Temp 97.5°F | Resp 28 | Ht 71.0 in | Wt 147.5 lb

## 2017-09-08 DIAGNOSIS — K4020 Bilateral inguinal hernia, without obstruction or gangrene, not specified as recurrent: Secondary | ICD-10-CM

## 2017-09-08 NOTE — Progress Notes (Signed)
[facility] [place]  Patient name: Stephen Cohen MRN: [id number] DOB: November 19, 1940 Sex: male  REASON FOR VISIT: Evaluation of bilateral  HPI: Stephen FusBilly R Cohen is a 76 y.o. male well-known to me from extensive past history of lower extremity arterial insufficiency.  He had undergone a femoral to femoral bypass in 1995.  He had the unusual complication of erosion at the graft and underwent resection of this portion of the graft and a new graft tunneling around the proximally 5 years ago.  He has done well since this time.  He is no new arterial insufficiency symptoms.  He recently had worsening of discomfort associated with chronic bilateral inguinal hernias.  [facility] and was told that he was uncomfortable doing this without having vascular surgery available.  Stephen Cohen seen today for further evaluation and recommendations regarding his hernias.  He reports these are quite painful for him.  He does have a relative ease for reduction of both of his hernias.  He does a great deal of physical activity lifting 50 pound bags of feed and has pain associated with this.  Past Medical History:  Diagnosis Date  . Anginal pain (HCC)    no pain lately  . Arthritis   . Automatic implantable cardioverter-defibrillator in situ   . Cataracts, bilateral   . COPD (chronic obstructive pulmonary disease) (HCC)   . Coronary artery disease   . GERD (gastroesophageal reflux disease)   . Hiatal hernia   . HOH (hard of hearing)   . Hyperlipidemia   . Hypertension   . Myocardial infarction (HCC) 1995  . Pacemaker   . Peptic ulcer disease   . Peripheral vascular disease (HCC)   . Ringing in ears   . Shortness of breath     Family History  Problem Relation Age of Onset  . Heart disease Mother   . Hyperlipidemia Mother   . Heart attack Mother   . Heart disease Father   . Hyperlipidemia Father   . Cancer Sister   .  Heart disease Sister   . Hyperlipidemia Sister   . Heart attack Sister   . Peripheral vascular disease Sister     SOCIAL HISTORY: Social History  Substance Use Topics  . Smoking status: Current Every Day Smoker    Packs/day: 0.75    Years: 65.00    Types: Cigarettes  . Smokeless tobacco: Never Used     Comment: 3/4 pk per day  . Alcohol use No    No Known Allergies  Current Outpatient Prescriptions  Medication Sig Dispense Refill  . albuterol (PROAIR HFA) 108 (90 Base) MCG/ACT inhaler Inhale into the lungs every 6 (six) hours as needed for wheezing or shortness of breath.    Marland Kitchen. aspirin EC 81 MG tablet Take 81 mg by mouth daily.    Marland Kitchen. atorvastatin (LIPITOR) 10 MG tablet Take 10 mg by mouth at bedtime.     . diphenhydrAMINE (BENADRYL) 25 mg capsule Take 50 mg by mouth daily as needed (for running nose).     Marland Kitchen. esomeprazole (NEXIUM) 20 MG capsule Take 20 mg by mouth daily.     . hydrALAZINE (APRESOLINE) 25 MG tablet Take 12.5 mg by mouth 3 (three) times daily. Cutting down on medication due to heart rate per patient    . loperamide (LOPERAMIDE A-D) 2 MG tablet Take 2 mg by mouth 4 (four) times daily as needed for diarrhea or loose stools.    .Marland Kitchen  losartan (COZAAR) 25 MG tablet Take 25 mg by mouth daily.    . metoprolol succinate (TOPROL-XL) 50 MG 24 hr tablet Take 50 mg by mouth daily. Take with or immediately following a meal.    . Multiple Vitamins-Minerals (OCUVITE ADULT 50+ PO) Take by mouth.    . nitroGLYCERIN (NITROSTAT) 0.4 MG SL tablet Place 0.4 mg under the tongue every 5 (five) minutes as needed. For chest pain    . Potassium 99 MG TABS Take by mouth.    . potassium chloride SA (K-DUR,KLOR-CON) 20 MEQ tablet Take 20 mEq by mouth daily.     Marland Kitchen torsemide (DEMADEX) 20 MG tablet Take 20 mg by mouth daily.    Marland Kitchen umeclidinium-vilanterol (ANORO ELLIPTA) 62.5-25 MCG/INH AEPB Inhale 1 puff into the lungs daily.     No current facility-administered medications for this visit.     REVIEW  OF SYSTEMS:  [X]  denotes positive finding, [ ]  denotes negative finding Cardiac  Comments:  Chest pain or chest pressure: x   Shortness of breath upon exertion: x   Short of breath when lying flat: x   Irregular heart rhythm: x       Vascular    Pain in calf, thigh, or hip brought on by ambulation: x   Pain in feet at night that wakes you up from your sleep:     Blood clot in your veins: x   Leg swelling:  x         PHYSICAL EXAM: Vitals:   09/08/17 1104  BP: (!) 118/58  Pulse: 61  Resp: (!) 28  Temp: (!) 97.5 F (36.4 C)  TempSrc: Oral  SpO2: 99%  Weight: 147 lb 8 oz (66.9 kg)  Height: 5\' 11"  (1.803 m)    GENERAL: The patient is a well-nourished male, in no acute distress. The vital signs are documented above. CARDIOVASCULAR: 2+ femorofemoral graft pulse.  Does have what appears to be solid lymphoceles near the arterial anastomosis but remote from these bilaterally.  Does have 2+ femoral pulses. Has large inguinal hernias bilaterally which are easily reducible PULMONARY: There is good air exchange  MUSCULOSKELETAL: There are no major deformities or cyanosis. NEUROLOGIC: No focal weakness or paresthesias are detected. SKIN: There are no ulcers or rashes noted. PSYCHIATRIC: The patient has a normal affect.  DATA:  None  MEDICAL ISSUES: Had long discussion with the patient.  I feel should be possible to repair his symptomatic hernias.  Explained that I would discuss this with general surgery to determine if he may be a candidate for a laparoscopic repair versus open repair.  I did speak with Dr. Ovidio Kin who felt that he may be a laparoscopic candidate based on my description.  We will schedule an appointment for Stephen Cohen to see Dr. Ezzard Standing for further evaluation.  We are available as needed if he needs vascular help around the time of surgery.  He will follow-up with me on an as needed basis    Larina Earthly, MD [facility] Tel [phone] Pager [phone]

## 2017-09-24 DIAGNOSIS — J441 Chronic obstructive pulmonary disease with (acute) exacerbation: Secondary | ICD-10-CM

## 2017-09-24 DIAGNOSIS — I10 Essential (primary) hypertension: Secondary | ICD-10-CM | POA: Diagnosis not present

## 2017-09-24 DIAGNOSIS — J9691 Respiratory failure, unspecified with hypoxia: Secondary | ICD-10-CM

## 2017-09-25 DIAGNOSIS — J9691 Respiratory failure, unspecified with hypoxia: Secondary | ICD-10-CM | POA: Diagnosis not present

## 2017-09-25 DIAGNOSIS — I10 Essential (primary) hypertension: Secondary | ICD-10-CM | POA: Diagnosis not present

## 2017-09-25 DIAGNOSIS — J441 Chronic obstructive pulmonary disease with (acute) exacerbation: Secondary | ICD-10-CM | POA: Diagnosis not present

## 2017-09-26 DIAGNOSIS — I10 Essential (primary) hypertension: Secondary | ICD-10-CM | POA: Diagnosis not present

## 2017-09-26 DIAGNOSIS — J441 Chronic obstructive pulmonary disease with (acute) exacerbation: Secondary | ICD-10-CM | POA: Diagnosis not present

## 2017-09-26 DIAGNOSIS — J9691 Respiratory failure, unspecified with hypoxia: Secondary | ICD-10-CM | POA: Diagnosis not present

## 2017-10-12 DIAGNOSIS — Z950 Presence of cardiac pacemaker: Secondary | ICD-10-CM | POA: Insufficient documentation

## 2017-10-12 DIAGNOSIS — R0602 Shortness of breath: Secondary | ICD-10-CM | POA: Insufficient documentation

## 2017-10-12 DIAGNOSIS — Z95810 Presence of automatic (implantable) cardiac defibrillator: Secondary | ICD-10-CM | POA: Insufficient documentation

## 2017-10-12 DIAGNOSIS — E785 Hyperlipidemia, unspecified: Secondary | ICD-10-CM | POA: Insufficient documentation

## 2017-10-12 DIAGNOSIS — I739 Peripheral vascular disease, unspecified: Secondary | ICD-10-CM | POA: Insufficient documentation

## 2017-10-12 DIAGNOSIS — J449 Chronic obstructive pulmonary disease, unspecified: Secondary | ICD-10-CM | POA: Insufficient documentation

## 2017-10-12 DIAGNOSIS — I2510 Atherosclerotic heart disease of native coronary artery without angina pectoris: Secondary | ICD-10-CM | POA: Insufficient documentation

## 2017-10-20 NOTE — Progress Notes (Signed)
Cardiology Office Note:    Date:  10/22/2017   ID:  Stephen Cohen, DOB [birth year], MRN [id number]  PCP:  Doreene Elandhomas, Millard B, MD  Cardiologist:  Norman HerrlichBrian Torez Beauregard, MD    Referring MD: Harden Mohomas, Randell C Jr., *    ASSESSMENT:    1. Systolic heart failure, chronic (HCC)   2. CAD in native artery   3. Ischemic dilated cardiomyopathy (HCC)   4. PAF (paroxysmal atrial fibrillation) (HCC)   5. Chronic anticoagulation   6. Pacemaker   7. Automatic implantable cardioverter-defibrillator in situ   8. Pure hypercholesterolemia   9. Chronic obstructive pulmonary disease, unspecified COPD type (HCC)   10. Peripheral vascular disease, unspecified (HCC)    PLAN:    In order of problems listed above:  1. Worsened his ejection fraction was reported at 20% done in the last few months at [facility] and aspirin I requested a copy of the record.  He is fluid overloaded symptomatic his diuretic will be increased to see him back in the office in 1 week and if unimproved likely will need repeat hospitalization.  A granddaughter is present will intervene he will sodium restrict weigh daily and record and uses a pillbox for medications.  He will be referred to the home heart failure [facility].  If compensated next visit I will switch him from ACE inhibitor to ARNI.  He may require a referral to palliative care if his course continues to deteriorate. 2. Continue current medical treatment, at this time I would not consider an invasive evaluation with his decompensated heart failure and recent hospitalization. 3. Worsened EF reported at 20% records requested 4. Stable continue anticoagulant 5. Stable continue his anticoagulant 6. Stable, at next visit I will refer to device clinic in my practice at the request Toqeer [facility] be [facility] 7. Stable continue statin 8. Severe progressive managed by pulmonary 9. Stable managed by vascular surgery  1 of his predominant  complaints is urinary frequency and pelvic pain I will refer him to urology Dr.Chao   Next appointment: 1 week   Medication Adjustments/Labs and Tests Ordered: Current medicines are reviewed at length with the patient today.  Concerns regarding medicines are outlined above.  Orders Placed This Encounter  Procedures  . Ambulatory referral to Urology   Meds ordered this encounter  Medications  . torsemide (DEMADEX) 20 MG tablet    Sig: Take 1 tablet (20 mg total) by mouth 2 (two) times daily. Take an extra tablet 20 mg mid day every other day starting today    Dispense:  270 tablet    Refill:  3  . spironolactone (ALDACTONE) 25 MG tablet    Sig: Take 1 tablet (25 mg total) by mouth daily.    Dispense:  90 tablet    Refill:  3  . lisinopril (PRINIVIL,ZESTRIL) 5 MG tablet    Sig: Take 1 tablet (5 mg total) by mouth daily.    Dispense:  90 tablet    Refill:  3  . isosorbide mononitrate (IMDUR) 30 MG 24 hr tablet    Sig: Take 1 tablet (30 mg total) by mouth daily.    Dispense:  90 tablet    Refill:  3  . carvedilol (COREG) 3.125 MG tablet    Sig: Take 1 tablet (3.125 mg total) by mouth 2 (two) times daily with a meal.    Dispense:  180 tablet    Refill:  3    Chief Complaint  Patient presents with  . Hospitalization Follow-up    11/17 in [facility]  . Congestive Heart Failure  . Coronary Artery Disease  . Atrial Fibrillation  . COPD    History of Present Illness:    Stephen Cohen is a 76 y.o. male with a hx of CHF EF 35%, CAD, PAD, Paroxysmal Atrial Fibrillation on anticoagulant, Dyslipidemia, HTN, S/P CABG in 1999 and with stenting to OM1 vein graft and distal native RCA in 2014 , COPD continuing to smoke and ICD last seen in May 2018. He has been seen by EP at [facility] with brief PAT and normal device function. A TTE was performed, results are unavailable. Recent [facility] admission with hypoxia, decompensated heart failure, COPD, EF reported as 20% done at [facility], BNP 23,800 and  sicharged on nasal oxygen Compliance with diet, lifestyle and medications: No, he is adding salt to his diet and was not referred to home heart failure program after hospitalization. He is doing very poorly remains very short of breath with any activity has peripheral edema and add salt to his diet.  He has been seen by pulmonary and follow-up is on home oxygen and nebulizer.  He is also had several episodes of angina relieved with nitroglycerin.  When seen by me in the office today he is in decompensated heart failure. Past Medical History:  Diagnosis Date  . Anginal pain (HCC)    no pain lately  . Arthritis   . Automatic implantable cardioverter-defibrillator in situ   . CAD in native artery 01/08/2016   Overview:  Cardiac cath 03/17/16: Conclusions Diagnostic Procedure Summary Severe global LV dysfunction. EF 35%, EDP=64mm Hg. Native RCA, LAD and Cx occluded. LIMA to LAD patent. SVG to RCA clear. Jump SVG to OM1 and OM2 patent. Has proximal stent, 25% narrowing. Diagnostic Procedure Recommendations Medical Therapy because no interventional therapy is required.  . Cataracts, bilateral   . COPD (chronic obstructive pulmonary disease) (HCC)   . Coronary artery disease   . Essential hypertension 01/08/2016  . GERD (gastroesophageal reflux disease)   . Hiatal hernia   . HOH (hard of hearing)   . Hyperlipidemia   . Hypertension   . Ischemic dilated cardiomyopathy (HCC) 06/07/2015  . Myocardial infarction (HCC) 1995  . Pacemaker   . PAF (paroxysmal atrial fibrillation) (HCC) 01/08/2016  . Peptic ulcer disease   . Peripheral vascular disease (HCC)   . Ringing in ears   . Shortness of breath     Past Surgical History:  Procedure Laterality Date  . APPENDECTOMY    . COLONOSCOPY    . cornary stents  11/2012  . CORONARY ANGIOPLASTY     2 stents in 2014.  Marland Kitchen CORONARY ARTERY BYPASS GRAFT  1995  . FEMORAL-FEMORAL BYPASS GRAFT N/A 12/12/2013   Procedure: BYPASS GRAFT FEMORAL-FEMORAL ARTERY-  RIGHT  TO LEFT using rifampin soaked hemashield graft;  Surgeon: Larina Earthly, MD;  Location: [facility] OR;  Service: Vascular;  Laterality: N/A;  . FEMORAL-FEMORAL BYPASS GRAFT Bilateral 07/03/2014   Procedure: REVISION OF Right FEMORAL to Left FEMORAL ARTERY BYPASS GRAFT with removal of Eroded graft;  Surgeon: Larina Earthly, MD;  Location: [facility] OR;  Service: Vascular;  Laterality: Bilateral;  . LEFT HEART CATH Left 11-11-12  . LEFT HEART CATHETERIZATION WITH CORONARY ANGIOGRAM N/A 11/11/2012   Procedure: LEFT HEART CATHETERIZATION WITH CORONARY ANGIOGRAM;  Surgeon: Pamella Pert, MD;  Location: [facility] CATH LAB;  Service: Cardiovascular;  Laterality: N/A;  Joaquin Courts  02/01/2013  [employer]  . PERCUTANEOUS CORONARY STENT INTERVENTION (PCI-S)  11/11/2012   Procedure: PERCUTANEOUS CORONARY STENT INTERVENTION (PCI-S);  Surgeon: Pamella PertJagadeesh R Ganji, MD;  [facility];  Service: Cardiovascular;;  . PR VEIN BYPASS GRAFT,AORTO-FEM-POP  1995  2005   Right to left Fem-Fem with revision   . REMOVAL OF GRAFT Bilateral 12/12/2013   Procedure: REMOVAL OF Femoral- femoral hemashield GRAFT;  Surgeon: Larina Earthlyodd F Early, MD;  [facility];  Service: Vascular;  Laterality: Bilateral;    Current Medications: Current Meds  Medication Sig  . acetaminophen (TYLENOL) 500 MG tablet Take 2,500 mg by mouth daily.  Marland Kitchen. albuterol (PROAIR HFA) 108 (90 Base) MCG/ACT inhaler Inhale into the lungs every 6 (six) hours as needed for wheezing or shortness of breath.  Marland Kitchen. apixaban (ELIQUIS) 5 MG TABS tablet Take 5 mg by mouth 2 (two) times daily.  Marland Kitchen. aspirin EC 81 MG tablet Take 81 mg by mouth daily.  Marland Kitchen. atorvastatin (LIPITOR) 10 MG tablet Take 10 mg by mouth at bedtime.   . carvedilol (COREG) 3.125 MG tablet Take 1 tablet (3.125 mg total) by mouth 2 (two) times daily with a meal.  . diphenhydrAMINE (BENADRYL) 25 mg capsule Take 50 mg by mouth daily as needed (for running nose).   Marland Kitchen. esomeprazole (NEXIUM) 20 MG capsule Take 20 mg by mouth daily.   .  isosorbide mononitrate (IMDUR) 30 MG 24 hr tablet Take 1 tablet (30 mg total) by mouth daily.  Marland Kitchen. lisinopril (PRINIVIL,ZESTRIL) 5 MG tablet Take 1 tablet (5 mg total) by mouth daily.  Marland Kitchen. loperamide (LOPERAMIDE A-D) 2 MG tablet Take 2 mg by mouth 4 (four) times daily as needed for diarrhea or loose stools.  . Multiple Vitamins-Minerals (OCUVITE ADULT 50+ PO) Take by mouth.  . nitroGLYCERIN (NITROSTAT) 0.4 MG SL tablet Place 1 tablet (0.4 mg total) under the tongue every 5 (five) minutes as needed. For chest pain  . Potassium 99 MG TABS Take by mouth.  . potassium chloride SA (K-DUR,KLOR-CON) 20 MEQ tablet Take 20 mEq by mouth daily.   Marland Kitchen. spironolactone (ALDACTONE) 25 MG tablet Take 1 tablet (25 mg total) by mouth daily.  Marland Kitchen. torsemide (DEMADEX) 20 MG tablet Take 1 tablet (20 mg total) by mouth 2 (two) times daily. Take an extra tablet 20 mg mid day every other day starting today  . umeclidinium-vilanterol (ANORO ELLIPTA) 62.5-25 MCG/INH AEPB Inhale 1 puff into the lungs daily.  . [DISCONTINUED] carvedilol (COREG) 3.125 MG tablet 2 (two) times daily.  . [DISCONTINUED] hydrALAZINE (APRESOLINE) 25 MG tablet Take 12.5 mg by mouth 3 (three) times daily. Cutting down on medication due to heart rate per patient  . [DISCONTINUED] isosorbide mononitrate (IMDUR) 30 MG 24 hr tablet daily.  . [DISCONTINUED] lisinopril (PRINIVIL,ZESTRIL) 5 MG tablet daily.  . [DISCONTINUED] losartan (COZAAR) 25 MG tablet Take 25 mg by mouth daily.  . [DISCONTINUED] metoprolol succinate (TOPROL-XL) 50 MG 24 hr tablet Take 50 mg by mouth daily. Take with or immediately following a meal.  . [DISCONTINUED] spironolactone (ALDACTONE) 25 MG tablet   . [DISCONTINUED] torsemide (DEMADEX) 20 MG tablet Take 20 mg by mouth 2 (two) times daily. Take an extra tablet 20 mg mid day every other day starting today     Allergies:   Patient has no known allergies.   Social History   Socioeconomic History  . Marital status: Married    Spouse  name: None  . Number of children: None  . Years of education: None  .  Highest education level: None  Social Needs  . Financial resource strain: None  . Food insecurity - worry: None  . Food insecurity - inability: None  . Transportation needs - medical: None  . Transportation needs - non-medical: None  Occupational History  . None  Tobacco Use  . Smoking status: Current Every Day Smoker    Packs/day: 1.00    Years: 65.00    Pack years: 65.00    Types: Cigarettes  . Smokeless tobacco: Never Used  . Tobacco comment: 3/4 pk per day  Substance and Sexual Activity  . Alcohol use: No  . Drug use: No  . Sexual activity: None  Other Topics Concern  . None  Social History Narrative  . None     Family History: The patient's family history includes Cancer in his sister; Heart attack in his mother and sister; Heart disease in his father, mother, and sister; Hyperlipidemia in his father, mother, and sister; Peripheral vascular disease in his sister. ROS:   Please see the history of present illness.    All other systems reviewed and are negative.  EKGs/Labs/Other Studies Reviewed:    The following studies were reviewed today: CXR with COPD and CHF CTA chest with severe emphysema EKG with [facility] and [facility] Recent Labs: 03/18/17 CMP normal, BMP 09/25/17 K 4.0, Cr 1.2, Hgb 11.2, Troponins were normal No results found for requested labs within last 8760 hours.  Recent Lipid Panel Chol90, HDL 24, LDL 66    Component Value Date/Time   CHOL 112 11/12/2012 0435   TRIG 117 11/12/2012 0435   HDL 25 (L) 11/12/2012 0435   CHOLHDL 4.5 11/12/2012 0435   VLDL 23 11/12/2012 0435   LDLCALC 64 11/12/2012 0435    Physical Exam:    VS:  BP 102/60 (BP Location: Right Arm, Patient Position: Sitting, Cuff Size: Normal)   Pulse 94   Ht 5\' 11"  (1.803 m)   Wt 141 lb (64 kg)   SpO2 (!) 85%   BMI 19.67 kg/m     Wt Readings from Last 3 Encounters:  10/22/17 141 lb (64 kg)  09/08/17 147 lb 8 oz  (66.9 kg)  07/18/14 162 lb 8 oz (73.7 kg)     GEN: Cachexic agitated Sob at rest HEENT: Normal NECK: No JVD; No carotid bruits LYMPHATICS: No lymphadenopathy CARDIAC:soft S1 S3 RRR RESPIRATORY:  Diminished, prolonged expiration fibrotic rales ABDOMEN: Soft, non-tender, non-distended MUSCULOSKELETAL: 1+ edema edema; No deformity  SKIN: Warm and dry NEUROLOGIC:  Alert and oriented x 3 PSYCHIATRIC:  Normal affect    Signed, Norman Herrlich, MD  10/22/2017 12:41 PM    [facility]

## 2017-10-21 ENCOUNTER — Telehealth: Payer: Self-pay | Admitting: *Deleted

## 2017-10-21 MED ORDER — NITROGLYCERIN 0.4 MG SL SUBL: 0.4000 mg | SUBLINGUAL_TABLET | SUBLINGUAL | 0 refills | Status: DC | PRN

## 2017-10-21 NOTE — Telephone Encounter (Signed)
Patient requested refill of Nitroglycerine until O.V. On Thursday 10/22/17. Okayed by Dr Dulce SellarMunley

## 2017-10-22 ENCOUNTER — Ambulatory Visit (INDEPENDENT_AMBULATORY_CARE_PROVIDER_SITE_OTHER): Payer: Medicare Other | Admitting: Cardiology

## 2017-10-22 ENCOUNTER — Encounter: Payer: Self-pay | Admitting: Cardiology

## 2017-10-22 ENCOUNTER — Emergency Department (HOSPITAL_COMMUNITY): Payer: Medicare Other

## 2017-10-22 ENCOUNTER — Other Ambulatory Visit: Payer: Self-pay

## 2017-10-22 ENCOUNTER — Emergency Department (EMERGENCY_DEPARTMENT_HOSPITAL)
Admission: EM | Admit: 2017-10-22 | Discharge: 2017-10-22 | Disposition: A | Discharge status: Home / Self Care | Payer: Medicare Other | Source: Home / Self Care | Attending: Emergency Medicine | Admitting: Emergency Medicine

## 2017-10-22 VITALS — BP 102/60 | HR 94 | Ht 71.0 in | Wt 141.0 lb

## 2017-10-22 DIAGNOSIS — Z7982 Long term (current) use of aspirin: Secondary | ICD-10-CM | POA: Insufficient documentation

## 2017-10-22 DIAGNOSIS — I4901 Ventricular fibrillation: Secondary | ICD-10-CM

## 2017-10-22 DIAGNOSIS — J449 Chronic obstructive pulmonary disease, unspecified: Secondary | ICD-10-CM

## 2017-10-22 DIAGNOSIS — R55 Syncope and collapse: Secondary | ICD-10-CM

## 2017-10-22 DIAGNOSIS — Z7901 Long term (current) use of anticoagulants: Secondary | ICD-10-CM | POA: Diagnosis not present

## 2017-10-22 DIAGNOSIS — R0602 Shortness of breath: Secondary | ICD-10-CM | POA: Insufficient documentation

## 2017-10-22 DIAGNOSIS — I739 Peripheral vascular disease, unspecified: Secondary | ICD-10-CM | POA: Diagnosis not present

## 2017-10-22 DIAGNOSIS — Z79899 Other long term (current) drug therapy: Secondary | ICD-10-CM

## 2017-10-22 DIAGNOSIS — I420 Dilated cardiomyopathy: Secondary | ICD-10-CM

## 2017-10-22 DIAGNOSIS — Z955 Presence of coronary angioplasty implant and graft: Secondary | ICD-10-CM | POA: Insufficient documentation

## 2017-10-22 DIAGNOSIS — I2510 Atherosclerotic heart disease of native coronary artery without angina pectoris: Secondary | ICD-10-CM

## 2017-10-22 DIAGNOSIS — I10 Essential (primary) hypertension: Secondary | ICD-10-CM

## 2017-10-22 DIAGNOSIS — Z950 Presence of cardiac pacemaker: Secondary | ICD-10-CM | POA: Diagnosis not present

## 2017-10-22 DIAGNOSIS — Z4502 Encounter for adjustment and management of automatic implantable cardiac defibrillator: Secondary | ICD-10-CM

## 2017-10-22 DIAGNOSIS — E7800 Pure hypercholesterolemia, unspecified: Secondary | ICD-10-CM

## 2017-10-22 DIAGNOSIS — I480 Paroxysmal atrial fibrillation: Secondary | ICD-10-CM | POA: Diagnosis not present

## 2017-10-22 DIAGNOSIS — K460 Unspecified abdominal hernia with obstruction, without gangrene: Secondary | ICD-10-CM | POA: Diagnosis not present

## 2017-10-22 DIAGNOSIS — I5022 Chronic systolic (congestive) heart failure: Secondary | ICD-10-CM

## 2017-10-22 DIAGNOSIS — F17210 Nicotine dependence, cigarettes, uncomplicated: Secondary | ICD-10-CM

## 2017-10-22 DIAGNOSIS — I255 Ischemic cardiomyopathy: Secondary | ICD-10-CM

## 2017-10-22 DIAGNOSIS — Z95810 Presence of automatic (implantable) cardiac defibrillator: Secondary | ICD-10-CM

## 2017-10-22 DIAGNOSIS — A419 Sepsis, unspecified organism: Secondary | ICD-10-CM | POA: Diagnosis not present

## 2017-10-22 LAB — BASIC METABOLIC PANEL
Anion gap: 10 (ref 5–15)
BUN: 73 mg/dL — AB (ref 6–20)
CALCIUM: 9 mg/dL (ref 8.9–10.3)
CO2: 21 mmol/L — ABNORMAL LOW (ref 22–32)
CREATININE: 1.88 mg/dL — AB (ref 0.61–1.24)
Chloride: 100 mmol/L — ABNORMAL LOW (ref 101–111)
GFR, EST AFRICAN AMERICAN: 38 mL/min — AB (ref >60)
GFR, EST NON AFRICAN AMERICAN: 33 mL/min — AB (ref >60)
Glucose, Bld: 85 mg/dL (ref 65–99)
Potassium: 5.7 mmol/L — ABNORMAL HIGH (ref 3.5–5.1)
SODIUM: 131 mmol/L — AB (ref 135–145)

## 2017-10-22 LAB — CBC
HCT: 35.2 % — ABNORMAL LOW (ref 39.0–52.0)
Hemoglobin: 11.1 g/dL — ABNORMAL LOW (ref 13.0–17.0)
MCH: 30 pg (ref 26.0–34.0)
MCHC: 31.5 g/dL (ref 30.0–36.0)
MCV: 95.1 fL (ref 78.0–100.0)
PLATELETS: 309 10*3/uL (ref 150–400)
RBC: 3.7 MIL/uL — ABNORMAL LOW (ref 4.22–5.81)
RDW: 15.5 % (ref 11.5–15.5)
WBC: 12.2 10*3/uL — AB (ref 4.0–10.5)

## 2017-10-22 LAB — BRAIN NATRIURETIC PEPTIDE: B Natriuretic Peptide: 202.4 pg/mL — ABNORMAL HIGH (ref 0.0–100.0)

## 2017-10-22 LAB — TROPONIN I: Troponin I: <0.03 ng/mL (ref <0.03)

## 2017-10-22 LAB — APTT: APTT: 40 s — AB (ref 24–36)

## 2017-10-22 LAB — CBG MONITORING, ED: Glucose-Capillary: 100 mg/dL — ABNORMAL HIGH (ref 65–99)

## 2017-10-22 LAB — PROTIME-INR
INR: 1.35
PROTHROMBIN TIME: 16.5 s — AB (ref 11.4–15.2)

## 2017-10-22 LAB — MAGNESIUM: MAGNESIUM: 2.2 mg/dL (ref 1.7–2.4)

## 2017-10-22 MED ORDER — CARVEDILOL 3.125 MG PO TABS: 3.1250 mg | ORAL_TABLET | Freq: Two times a day (BID) | ORAL | 3 refills | Status: DC

## 2017-10-22 MED ORDER — LISINOPRIL 5 MG PO TABS: 5.0000 mg | ORAL_TABLET | Freq: Every day | ORAL | 3 refills | Status: DC

## 2017-10-22 MED ORDER — SPIRONOLACTONE 25 MG PO TABS: 25.0000 mg | ORAL_TABLET | Freq: Every day | ORAL | 3 refills | Status: DC

## 2017-10-22 MED ORDER — SODIUM CHLORIDE 0.9 % IV SOLN
1.0000 g | Freq: Once | INTRAVENOUS | Status: AC
  Administered 2017-10-22: 1 g via INTRAVENOUS
  Filled 2017-10-22: qty 10

## 2017-10-22 MED ORDER — TORSEMIDE 20 MG PO TABS: 20.0000 mg | ORAL_TABLET | Freq: Two times a day (BID) | ORAL | 3 refills | Status: DC

## 2017-10-22 MED ORDER — ISOSORBIDE MONONITRATE ER 30 MG PO TB24: 30.0000 mg | ORAL_TABLET | Freq: Every day | ORAL | 3 refills | Status: DC

## 2017-10-22 NOTE — ED Provider Notes (Addendum)
[facility] EMERGENCY DEPARTMENT Provider Note   CSN: [id number] Arrival date & time: 10/22/17  1718     History   Chief Complaint Chief Complaint  Patient presents with  . Pacemaker Problem    HPI Stephen Cohen is a 76 y.o. male.  HPI 76 year old male with history of ischemic cardiomyopathy with EF of 20%, PVAD, Afib, AICD and pacemaker implantation comes in with chief complaint of AICD firing. Per grand daughter, patient was showing her his guitar when he suddenly collapsed.  The patient turned pale, became diaphoretic prior to collapsing.  Patient was unresponsive for a few seconds.  There was no seizure-like activity.  Patient reports that prior to him collapsing he did get short of breath. He also reports that he experienced the AICD firing when he regained consciousness.  Patient has been taking his medications as prescribed.  He does indicate that his shortness of breath with exertion has been getting worse over the past few days.  Patient saw his cardiologist earlier today.  Past Medical History:  Diagnosis Date  . Anginal pain (HCC)    no pain lately  . Arthritis   . Automatic implantable cardioverter-defibrillator in situ   . CAD in native artery 01/08/2016   Overview:  Cardiac cath 03/17/16: Conclusions Diagnostic Procedure Summary Severe global LV dysfunction. EF 35%, EDP=84mm Hg. Native RCA, LAD and Cx occluded. LIMA to LAD patent. SVG to RCA clear. Jump SVG to OM1 and OM2 patent. Has proximal stent, 25% narrowing. Diagnostic Procedure Recommendations Medical Therapy because no interventional therapy is required.  . Cataracts, bilateral   . COPD (chronic obstructive pulmonary disease) (HCC)   . Coronary artery disease   . Essential hypertension 01/08/2016  . GERD (gastroesophageal reflux disease)   . Hiatal hernia   . HOH (hard of hearing)   . Hyperlipidemia   . Hypertension   . Ischemic dilated cardiomyopathy (HCC) 06/07/2015  . Myocardial  infarction (HCC) 1995  . Pacemaker   . PAF (paroxysmal atrial fibrillation) (HCC) 01/08/2016  . Peptic ulcer disease   . Peripheral vascular disease (HCC)   . Ringing in ears   . Shortness of breath     Patient Active Problem List   Diagnosis Date Noted  . Shortness of breath   . Peripheral vascular disease (HCC)   . Pacemaker   . Hyperlipidemia   . Coronary artery disease   . COPD (chronic obstructive pulmonary disease) (HCC)   . Automatic implantable cardioverter-defibrillator in situ   . History of bilateral inguinal hernias 07/09/2017  . H/O heart artery stent 06/24/2017  . Dyslipidemia 06/24/2017  . Hx of CABG 06/24/2017  . Chest pain 03/15/2016  . CAD in native artery 01/08/2016  . Chronic anticoagulation 01/08/2016  . Essential hypertension 01/08/2016  . PAF (paroxysmal atrial fibrillation) (HCC) 01/08/2016  . Pulmonary emphysema (HCC) 01/08/2016  . AICD present, double chamber 06/07/2015  . Ischemic dilated cardiomyopathy (HCC) 06/07/2015  . Encounter for post surgical wound check 07/18/2014  . Erosion of graft 07/03/2014  . Visit for wound check 06/20/2014  . Peripheral vascular disease, unspecified (HCC) 02/14/2014  . Post op infection 12/21/2013  . PAD (peripheral artery disease) (HCC) 12/12/2013  . Dizziness and giddiness 08/30/2013  . Swelling of limb-Bilateral leg 08/30/2013  . Pain in limb-Left leg 08/30/2013  . Nonpalpable pulse-Left groin area 08/30/2013  . Unstable angina (HCC) 11/11/2012  . 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 S/P PTCA (percutaneous transluminal coronary angioplasty) 11/11/2012  .  Atherosclerosis of native arteries of the extremities with intermittent claudication 08/20/2012  . Aftercare following surgery of the circulatory system, NEC 08/20/2012  . Myocardial infarction [facility]) 11/10/1993    Past Surgical History:  Procedure Laterality Date  . APPENDECTOMY    . COLONOSCOPY    . cornary stents  11/2012  . CORONARY  ANGIOPLASTY     2 stents in 2014.  Marland Kitchen CORONARY ARTERY BYPASS GRAFT  1995  . FEMORAL-FEMORAL BYPASS GRAFT N/A 12/12/2013   Procedure: BYPASS GRAFT FEMORAL-FEMORAL ARTERY-  RIGHT TO LEFT using rifampin soaked hemashield graft;  Surgeon: Larina Earthly, MD;  Location: [facility] OR;  Service: Vascular;  Laterality: N/A;  . FEMORAL-FEMORAL BYPASS GRAFT Bilateral 07/03/2014   Procedure: REVISION OF Right FEMORAL to Left FEMORAL ARTERY BYPASS GRAFT with removal of Eroded graft;  Surgeon: Larina Earthly, MD;  Location: [facility] OR;  Service: Vascular;  Laterality: Bilateral;  . LEFT HEART CATH Left 11-11-12  . LEFT HEART CATHETERIZATION WITH CORONARY ANGIOGRAM N/A 11/11/2012   Procedure: LEFT HEART CATHETERIZATION WITH CORONARY ANGIOGRAM;  Surgeon: Pamella Pert, MD;  Location: [facility] CATH LAB;  Service: Cardiovascular;  Laterality: N/A;  . Pace Maker  02/01/2013   St. Jude  . PERCUTANEOUS CORONARY STENT INTERVENTION (PCI-S)  11/11/2012   Procedure: PERCUTANEOUS CORONARY STENT INTERVENTION (PCI-S);  Surgeon: Pamella Pert, MD;  Location: [facility] CATH LAB;  Service: Cardiovascular;;  . PR VEIN BYPASS GRAFT,AORTO-FEM-POP  1995  2005   Right to left Fem-Fem with revision   . REMOVAL OF GRAFT Bilateral 12/12/2013   Procedure: REMOVAL OF Femoral- femoral hemashield GRAFT;  Surgeon: Larina Earthly, MD;  Location: [facility] OR;  Service: Vascular;  Laterality: Bilateral;       Home Medications    Prior to Admission medications   Medication Sig Start Date End Date Taking? Authorizing Provider  acetaminophen (TYLENOL) 500 MG tablet Take 2,500 mg by mouth daily.   Yes [provider]  albuterol (PROAIR HFA) 108 (90 Base) MCG/ACT inhaler Inhale into the lungs every 6 (six) hours as needed for wheezing or shortness of breath.   Yes [provider]  apixaban (ELIQUIS) 5 MG TABS tablet Take 5 mg by mouth 2 (two) times daily. 07/08/17 07/08/18 Yes [provider]  aspirin EC 81 MG tablet Take 81 mg by mouth daily.   Yes  [provider]  atorvastatin (LIPITOR) 10 MG tablet Take 10 mg by mouth at bedtime.    Yes [provider]  carvedilol (COREG) 3.125 MG tablet Take 1 tablet (3.125 mg total) by mouth 2 (two) times daily with a meal. 10/22/17  Yes Baldo Daub, MD  diphenhydrAMINE (BENADRYL) 25 mg capsule Take 50 mg by mouth 4 (four) times daily.    Yes [provider]  esomeprazole (NEXIUM) 20 MG capsule Take 20 mg by mouth daily.    Yes [provider]  isosorbide mononitrate (IMDUR) 30 MG 24 hr tablet Take 1 tablet (30 mg total) by mouth daily. 10/22/17  Yes Baldo Daub, MD  lisinopril (PRINIVIL,ZESTRIL) 5 MG tablet Take 1 tablet (5 mg total) by mouth daily. 10/22/17  Yes Baldo Daub, MD  loperamide (LOPERAMIDE A-D) 2 MG tablet Take 2 mg by mouth 4 (four) times daily as needed for diarrhea or loose stools.   Yes [provider]  Multiple Vitamins-Minerals (OCUVITE ADULT 50+ PO) Take 2 tablets by mouth daily.    Yes [provider]  nitroGLYCERIN (NITROSTAT) 0.4 MG SL tablet Place 1  tablet (0.4 mg total) under the tongue every 5 (five) minutes as needed. For chest pain 10/21/17  Yes Baldo Daub, MD  Potassium 99 MG TABS Take 198 mg by mouth daily.    Yes [provider]  spironolactone (ALDACTONE) 25 MG tablet Take 1 tablet (25 mg total) by mouth daily. 10/22/17  Yes Baldo Daub, MD  torsemide (DEMADEX) 20 MG tablet Take 1 tablet (20 mg total) by mouth 2 (two) times daily. Take an extra tablet 20 mg mid day every other day starting today Patient taking differently: Take 20 mg by mouth See admin instructions. Take Twice daily then take an extra 20 mg mid day every other day 10/22/17  Yes Baldo Daub, MD  umeclidinium-vilanterol (ANORO ELLIPTA) 62.5-25 MCG/INH AEPB Inhale 1 puff into the lungs daily.   Yes [provider]    Family History Family History  Problem Relation Age of Onset  . Heart disease Mother   .  Hyperlipidemia Mother   . Heart attack Mother   . Heart disease Father   . Hyperlipidemia Father   . Cancer Sister   . Heart disease Sister   . Hyperlipidemia Sister   . Heart attack Sister   . Peripheral vascular disease Sister     Social History Social History   Tobacco Use  . Smoking status: Current Every Day Smoker    Packs/day: 1.00    Years: 65.00    Pack years: 65.00    Types: Cigarettes  . Smokeless tobacco: Never Used  . Tobacco comment: 3/4 pk per day  Substance Use Topics  . Alcohol use: No  . Drug use: No     Allergies   Patient has no known allergies.   Review of Systems Review of Systems  Constitutional: Positive for activity change.  Respiratory: Positive for shortness of breath.   Cardiovascular: Positive for chest pain.  Neurological: Positive for syncope.  All other systems reviewed and are negative.    Physical Exam Updated Vital Signs BP 112/66   Pulse 84   Temp (!) 97.3 F (36.3 C) (Oral)   Resp (!) 30   Ht 5\' 11"  (1.803 m)   Wt 64 kg (141 lb)   SpO2 94%   BMI 19.67 kg/m   Physical Exam  Constitutional: He is oriented to person, place, and time. He appears well-developed.  HENT:  Head: Atraumatic.  Neck: Neck supple. No JVD present.  Cardiovascular: Normal rate.  Murmur heard. Pulmonary/Chest: Effort normal.  Abdominal: Soft.  Musculoskeletal: He exhibits no edema.  Neurological: He is alert and oriented to person, place, and time.  Skin: Skin is warm.  Nursing note and vitals reviewed.    ED Treatments / Results  Labs (all labs ordered are listed, but only abnormal results are displayed) Labs Reviewed  BASIC METABOLIC PANEL - Abnormal; Notable for the following components:      Result Value   Sodium 131 (*)    Potassium 5.7 (*)    Chloride 100 (*)    CO2 21 (*)    BUN 73 (*)    Creatinine, Ser 1.88 (*)    GFR calc non Af Amer 33 (*)    GFR calc Af Amer 38 (*)    All other components within normal limits  CBC -  Abnormal; Notable for the following components:   WBC 12.2 (*)    RBC 3.70 (*)    Hemoglobin 11.1 (*)    HCT 35.2 (*)  All other components within normal limits  APTT - Abnormal; Notable for the following components:   aPTT 40 (*)    All other components within normal limits  PROTIME-INR - Abnormal; Notable for the following components:   Prothrombin Time 16.5 (*)    All other components within normal limits  BRAIN NATRIURETIC PEPTIDE - Abnormal; Notable for the following components:   B Natriuretic Peptide 202.4 (*)    All other components within normal limits  CBG MONITORING, ED - Abnormal; Notable for the following components:   Glucose-Capillary 100 (*)    All other components within normal limits  MAGNESIUM  TROPONIN I    EKG  EKG Interpretation  Date/Time:  Thursday October 22 2017 17:24:16 EST Ventricular Rate:  91 PR Interval:    QRS Duration: 178 QT Interval:  420 QTC Calculation: 517 R Axis:   166 Text Interpretation:  Sinus rhythm RBBB and LPFB compared to 2015 there is new rbbb Nonspecific ST and T wave abnormality Confirmed by Derwood Kaplananavati, Jevaeh Shams [phone]([id number]) on 10/22/2017 7:11:24 PM       Radiology Dg Chest 2 View  Result Date: 10/22/2017 CLINICAL DATA:  Syncopal episode earlier tonight, patient unresponsive for several seconds. Indwelling pacing defibrillator due to ischemic cardiomyopathy. Patient states that the defibrillator fired this evening. EXAM: CHEST  2 VIEW COMPARISON:  10/02/2017, 09/24/2017 and earlier, including CTA chest abdomen and pelvis 09/24/2017 and CTA chest 03/12/2016. FINDINGS: Prior sternotomy for CABG. Cardiac silhouette moderately enlarged for the AP technique, unchanged. Thoracic aorta atherosclerotic, unchanged. Left subclavian pacing defibrillator unchanged and appears intact. Extensive interstitial pulmonary fibrosis throughout both lungs and emphysematous changes in the upper lobes, unchanged. No new pulmonary parenchymal abnormalities.  No pleural effusions. Visualized bony thorax intact IMPRESSION: 1.  No acute cardiopulmonary disease. 2. Stable moderate cardiomegaly without evidence of pulmonary edema. 3. Stable chronic interstitial pulmonary fibrosis. 4. Aortic Atherosclerosis (ICD10-I70.0) and Emphysema (ICD10-J43.9). Electronically Signed   By: Hulan Saashomas  Lawrence M.D.   On: 10/22/2017 20:58    Procedures Procedures (including critical care time)  CRITICAL CARE Performed by: Raiya Stainback   Total critical care time: 52 minutes  Critical care time was exclusive of separately billable procedures and treating other patients.  Critical care was necessary to treat or prevent imminent or life-threatening deterioration.  Critical care was time spent personally by me on the following activities: development of treatment plan with patient and/or surrogate as well as nursing, discussions with consultants, evaluation of patient's response to treatment, examination of patient, obtaining history from patient or surrogate, ordering and performing treatments and interventions, ordering and review of laboratory studies, ordering and review of radiographic studies, pulse oximetry and re-evaluation of patient's condition.   Medications Ordered in ED Medications  calcium gluconate 1 g in sodium chloride 0.9 % 100 mL IVPB (0 g Intravenous Stopped 10/22/17 2239)     Initial Impression / Assessment and Plan / ED Course  I have reviewed the triage vital signs and the nursing notes.  Pertinent labs & imaging results that were available during my care of the patient were reviewed by me and considered in my medical decision making (see chart for details).  Clinical Course as of Oct 22 2338  Thu Oct 22, 2017  1940 Discussed case with Dr. Rennis GoldenHilty, cardiology.  Dr. Rennis GoldenHilty aware that patient had a syncopal episode with AICD discharge.  Dr. Rennis GoldenHilty made aware of the past medical history, and he reviewed patient's medical records independently.  Dr.  Rennis GoldenHilty recommends that in an  event of singular discharge, it would be safe for patient to be discharged home if there is no decompensated CHF, persistent dysrhythmia -and patient can be seen by primary cardiologist. Labs are pending at this time.  AICD interrogation is also pending.  [AN]  2228 Pacemaker /AICD interrogation report reviewed.  It does appear that patient had gone into V. fib and required 2 shocks.  Patient also has had intermittent episodes of SVT and A. Fib/flutter.  Labs overall are reassuring besides the elevated creatinine and slightly elevated potassium.  I spoke with cardiology fellow, Dr. Rodena MedinAmit -and he does not think that patient needs admission primarily for the dysrhythmia.  However he does recommend that patient ideally see EP.   Results from the ER workup discussed with the patient face to face and all questions answered to the best of my ability.  Patient wants to go home and is comfortable going home.  Granddaughter lives 20 minutes away, daughter lives 5 minutes away and they will be checking on him.  Granddaughter reports that she will call primary cardiology office tomorrow for a stat follow-up, we will also provide EP information.  Strict ER return precautions have been discussed, and patient is agreeing with the plan and is comfortable with the workup done and the recommendations from the ER.   [AN]    Clinical Course User Index [AN] Derwood KaplanNanavati, Shoichi Mielke, MD    76 year old with complex cardiac history comes in after AICD discharge. Clinically patient does not appear to be in acute decompensation of his CHF.  It also seems that patient has been taking his medications as prescribed.  If AICD discharged, this appears to be appropriate AICD discharge, and we will look at the underlying rhythm that provoked the firing. Cards will be consulted. Basic labs have been ordered.   Final Clinical Impressions(s) / ED Diagnoses   Final diagnoses:  AICD discharge  Syncope and  collapse  Ventricular fibrillation [facility](HCC)    ED Discharge Orders    None       Derwood KaplanNanavati, Tait Balistreri, MD 10/22/17 1944    Derwood KaplanNanavati, Zulay Corrie, MD 10/22/17 2340

## 2017-10-22 NOTE — Discharge Instructions (Signed)
You are seen in the ER after her AICD fired. Labs in the ER are reassuring.  Monitoring in the ER was uneventful.  It is prudent that you call the cardiologist tomorrow morning.  Please let your cardiologist know that you went into V. fib and required 2 shocks to get out of it - and that you might need some medication tweaking. Please ask your cardiologist if he is comfortable with the management or if he would want you to see the EP doctor.  Return to the Er immediately if the AICD discharges again.

## 2017-10-22 NOTE — ED Notes (Signed)
Pacemaker re-interrogated 

## 2017-10-22 NOTE — Patient Instructions (Addendum)
Medication Instructions:  Your physician has recommended you make the following change in your medication:  CHANGE take an extra 20 mg of torsemide mid day every other day starting today.  Labwork: None  Testing/Procedures: None  Follow-Up: Your physician recommends that you schedule a follow-up appointment in: 1 week.  [facility] will [facility] with [facility].  You will receive a phone call to schedule with urology-Dr. Saddie Bendershao.  Any Other Special Instructions Will Be Listed Below (If Applicable).     If you need a refill on your cardiac medications before your next appointment, please call your pharmacy.    Heart Failure  Weigh yourself every morning when you first wake up and record on a calender or note pad, bring this to your office visits. Using a pill tender can help with taking your medications consistently.  Limit your fluid intake to 2 liters daily  Limit your sodium intake to less than 2-3 grams daily. Ask if you need dietary teaching.  If you gain more than 3 pounds (from your dry weight ), double your dose of diuretic for the day.  If you gain more than 5 pounds (from your dry weight), double your dose of lasix and call your heart failure doctor.  Please do not smoke tobacco since it is very bad for your heart.  Please do not drink alcohol since it can worsen your heart failure.Also avoid OTC nonsteroidal drugs, such as advil, aleve and motrin.  Try to exercise for at least 30 minutes every day because this will help your heart be more efficient. You may be eligible for supervised cardiac rehab, ask your physician.

## 2017-10-22 NOTE — Progress Notes (Signed)
I responded to page by Dr Rhunette CroftNanavati, to discuss  Plan of  Care  Reviewed  Dr Dulce SellarMunley   And Dr Rennis GoldenHilty records  And plan  Patient has not  Had  Any chest pain or abnl  Rhythm  Since ICD shock .  It appears he had an appropriate shock  And will benefit  From Device and or  EP clinic, referral to Dr Dulce SellarMunley office, DEVICE CLINIC  For  Adjustment  And  Addition of  Anti arrhythmic and or  Cautious  Increase of rate control.  Reviewed in detail notes of Dr Dulce SellarMunley  And Dr Rennis GoldenHilty  Which are in agreement of  Above  Plan .

## 2017-10-22 NOTE — ED Notes (Addendum)
Interrogation report given to Rhunette CroftNanavati, MD

## 2017-10-22 NOTE — ED Notes (Signed)
ED Provider at bedside. 

## 2017-10-22 NOTE — ED Triage Notes (Signed)
Pt stood up to walk and pacemaker fired. Pt experienced SOB immediately afterwards. Pacemaker has been in for 4 years and this is the first time it has fired.

## 2017-10-22 NOTE — ED Notes (Signed)
Pt. To XRAY via stretcher. 

## 2017-10-22 NOTE — ED Notes (Signed)
Patient states his pacemaker is a [employer]. Jude model. Pacemaker interrogated.

## 2017-10-22 NOTE — ED Notes (Signed)
CBG 100 mg/dL. RN notified.

## 2017-10-22 NOTE — Progress Notes (Signed)
Called by Dr. Rhunette CroftNanavati in the [facility] regarding patient Stephen Cohen, seen today by Dr. Dulce SellarMunley.  His office note indicates that he did not appear to be in decompensated congestive heart failure.  His echo apparently recently showed worsening LVEF to 20%.  Recommendations were for sodium restriction and daily weights.  He was referred to home health heart failure program.  He mentioned considering switching from ACE inhibitor to ARNI and considered palliative care if he deteriorated further.  He was brought into the ER today after an unresponsive event with a defibrillator firing.  This is the first time that his device has fired.  Subsequently he recovered and is been asymptomatic since that event.  The ER is currently interrogating his device.  I instructed them if this is an appropriate shock for ventricular arrhythmia, then no further workup is necessary at this time.  If he is noted to have had multiple events or frequent ATP events, then we may need to evaluate him further and consider antiarrhythmic therapy, otherwise, the device has performed appropriately and the patient should follow-up with Dr. Dulce SellarMunley.  Please feel free to contact us with questions or if the clinical scenario changes.  Chrystie NoseKenneth C. Hilty, MD, [facility]ryFACC, FACP  [place]  [facility]  Medical Director of the [facility]  Cardiovascular Risk Reduction Clinic Attending Cardiologist  Direct Dial: [phone]  Fax: [phone]  Website:  [email or web address]

## 2017-10-23 ENCOUNTER — Ambulatory Visit: Payer: Medicare Other | Admitting: Cardiology

## 2017-10-23 ENCOUNTER — Encounter: Payer: Self-pay | Admitting: Cardiology

## 2017-10-23 ENCOUNTER — Telehealth: Payer: Self-pay

## 2017-10-23 VITALS — BP 124/56 | HR 80 | Ht 71.0 in | Wt 141.0 lb

## 2017-10-23 DIAGNOSIS — I472 Ventricular tachycardia, unspecified: Secondary | ICD-10-CM

## 2017-10-23 DIAGNOSIS — I25708 Atherosclerosis of coronary artery bypass graft(s), unspecified, with other forms of angina pectoris: Secondary | ICD-10-CM | POA: Diagnosis not present

## 2017-10-23 DIAGNOSIS — I255 Ischemic cardiomyopathy: Secondary | ICD-10-CM | POA: Diagnosis not present

## 2017-10-23 DIAGNOSIS — I480 Paroxysmal atrial fibrillation: Secondary | ICD-10-CM | POA: Diagnosis not present

## 2017-10-23 MED ORDER — ISOSORBIDE MONONITRATE ER 60 MG PO TB24: 60.0000 mg | ORAL_TABLET | Freq: Every day | ORAL | 3 refills | Status: DC

## 2017-10-23 MED ORDER — AMIODARONE HCL 200 MG PO TABS: 200.0000 mg | ORAL_TABLET | Freq: Every day | ORAL | 2 refills | Status: DC

## 2017-10-23 MED ORDER — AMIODARONE HCL 200 MG PO TABS: ORAL_TABLET | ORAL | 0 refills | Status: DC

## 2017-10-23 NOTE — Addendum Note (Signed)
Addended by: Baird LyonsPRICE, SHERRI L on: 10/23/2017 03:19 PM   Modules accepted: Orders

## 2017-10-23 NOTE — Patient Instructions (Addendum)
Medication Instructions:  Your physician has recommended you make the following change in your medication:  1. INCREASE Imdur to 60 mg daily 2. START Amiodarone  - take 2 tablets (400 mg total) TWICE a day for 2 weeks, then  - take 2 tablet (400 mg total) ONCE a day for 2 weeks, then  - take 1 tablet (200 mg total) once daily  * If you need a refill on your cardiac medications before your next appointment, please call your pharmacy. *  Labwork: None ordered  Testing/Procedures: None ordered  Follow-Up: Your physician recommends that you schedule a follow-up appointment in: 3 months with Dr. Elberta Fortisamnitz.  Thank you for choosing [facility]!!   Dory HornSherri Elyjah Hazan, RN [phone]  Any Other Special Instructions Will Be Listed Below (If Applicable).  Amiodarone tablets What is this medicine? AMIODARONE (a MEE oh da rone) is an antiarrhythmic drug. It helps make your heart beat regularly. Because of the side effects caused by this medicine, it is only used when other medicines have not worked. It is usually used for heartbeat problems that may be life threatening. This medicine may be used for other purposes; ask your health care provider or pharmacist if you have questions. COMMON BRAND NAME(S): Cordarone, Pacerone What should I tell my health care provider before I take this medicine? They need to know if you have any of these conditions: -liver disease -lung disease -other heart problems -thyroid disease -an unusual or allergic reaction to amiodarone, iodine, other medicines, foods, dyes, or preservatives -pregnant or trying to get pregnant -breast-feeding How should I use this medicine? Take this medicine by mouth with a glass of water. Follow the directions on the prescription label. You can take this medicine with or without food. However, you should always take it the same way each time. Take your doses at regular intervals. Do not take your medicine more often than directed.  Do not stop taking except on the advice of your doctor or health care professional. A special MedGuide will be given to you by the pharmacist with each prescription and refill. Be sure to read this information carefully each time. Talk to your pediatrician regarding the use of this medicine in children. Special care may be needed. Overdosage: If you think you have taken too much of this medicine contact a poison control center or emergency room at once. NOTE: This medicine is only for you. Do not share this medicine with others. What if I miss a dose? If you miss a dose, take it as soon as you can. If it is almost time for your next dose, take only that dose. Do not take double or extra doses. What may interact with this medicine? Do not take this medicine with any of the following medications: -abarelix -apomorphine -arsenic trioxide -certain antibiotics like erythromycin, gemifloxacin, levofloxacin, pentamidine -certain medicines for depression like amoxapine, tricyclic antidepressants -certain medicines for fungal infections like fluconazole, itraconazole, ketoconazole, posaconazole, voriconazole -certain medicines for irregular heart beat like disopyramide, dofetilide, dronedarone, ibutilide, propafenone, sotalol -certain medicines for malaria like chloroquine, halofantrine -cisapride -droperidol -haloperidol -hawthorn -maprotiline -methadone -phenothiazines like chlorpromazine, mesoridazine, thioridazine -pimozide -ranolazine -red yeast rice -vardenafil -ziprasidone This medicine may also interact with the following medications: -antiviral medicines for HIV or AIDS -certain medicines for blood pressure, heart disease, irregular heart beat -certain medicines for cholesterol like atorvastatin, cerivastatin, lovastatin, simvastatin -certain medicines for hepatitis C like sofosbuvir and ledipasvir; sofosbuvir -certain medicines for seizures like phenytoin -certain medicines for  thyroid problems -  certain medicines that treat or prevent blood clots like warfarin -cholestyramine -cimetidine -clopidogrel -cyclosporine -dextromethorphan -diuretics -fentanyl -general anesthetics -grapefruit juice -lidocaine -loratadine -methotrexate -other medicines that prolong the QT interval (cause an abnormal heart rhythm) -procainamide -quinidine -rifabutin, rifampin, or rifapentine -St. John's Wort -trazodone This list may not describe all possible interactions. Give your health care provider a list of all the medicines, herbs, non-prescription drugs, or dietary supplements you use. Also tell them if you smoke, drink alcohol, or use illegal drugs. Some items may interact with your medicine. What should I watch for while using this medicine? Your condition will be monitored closely when you first begin therapy. Often, this drug is first started in a hospital or other monitored health care setting. Once you are on maintenance therapy, visit your doctor or health care professional for regular checks on your progress. Because your condition and use of this medicine carry some risk, it is a good idea to carry an identification card, necklace or bracelet with details of your condition, medications, and doctor or health care professional. Bonita Quin may get drowsy or dizzy. Do not drive, use machinery, or do anything that needs mental alertness until you know how this medicine affects you. Do not stand or sit up quickly, especially if you are an older patient. This reduces the risk of dizzy or fainting spells. This medicine can make you more sensitive to the sun. Keep out of the sun. If you cannot avoid being in the sun, wear protective clothing and use sunscreen. Do not use sun lamps or tanning beds/booths. You should have regular eye exams before and during treatment. Call your doctor if you have blurred vision, see halos, or your eyes become sensitive to light. Your eyes may get dry. It may  be helpful to use a lubricating eye solution or artificial tears solution. If you are going to have surgery or a procedure that requires contrast dyes, tell your doctor or health care professional that you are taking this medicine. What side effects may I notice from receiving this medicine? Side effects that you should report to your doctor or health care professional as soon as possible: -allergic reactions like skin rash, itching or hives, swelling of the face, lips, or tongue -blue-gray coloring of the skin -blurred vision, seeing blue green halos, increased sensitivity of the eyes to light -breathing problems -chest pain -dark urine -fast, irregular heartbeat -feeling faint or light-headed -intolerance to heat or cold -nausea or vomiting -pain and swelling of the scrotum -pain, tingling, numbness in feet, hands -redness, blistering, peeling or loosening of the skin, including inside the mouth -spitting up blood -stomach pain -sweating -unusual or uncontrolled movements of body -unusually weak or tired -weight gain or loss -yellowing of the eyes or skin Side effects that usually do not require medical attention (report to your doctor or health care professional if they continue or are bothersome): -change in sex drive or performance -constipation -dizziness -headache -loss of appetite -trouble sleeping This list may not describe all possible side effects. Call your doctor for medical advice about side effects. You may report side effects to FDA at 1-800-FDA-1088. Where should I keep my medicine? Keep out of the reach of children. Store at room temperature between 20 and 25 degrees C (68 and 77 degrees F). Protect from light. Keep container tightly closed. Throw away any unused medicine after the expiration date. NOTE: This sheet is a summary. It may not cover all possible information. If you have questions about this  medicine, talk to your doctor, pharmacist, or health care  provider.  2018 Elsevier/Gold Standard (2014-01-30 19:48:11)

## 2017-10-23 NOTE — Telephone Encounter (Signed)
Spoke with granddaughter, Asher MuirJamie, who accompanied patient to appointment yesterday, and is on DPR. Advised for patient to stop potassium supplements. Asher MuirJamie verbalized understanding. Advised that she would be receiving a phone call to schedule with EP for an appointment ASAP. Appointment made for Monday 10/26/17 with Dr. Dulce SellarMunley at 10:40 am from ICD firing, and ED follow-up. Josefa Halfdvised Jamie to call with any further questions or concerns.

## 2017-10-23 NOTE — Progress Notes (Signed)
Electrophysiology Office Note   Date:  10/23/2017   ID:  Stephen Cohen, DOB 04-07-1941, MRN [id number]  PCP:  Doreene Eland, MD  Cardiologist:  Dulce Sellar Primary Electrophysiologist:  Will Jorja Loa, MD    Chief Complaint  Patient presents with  . Congestive Heart Failure     History of Present Illness: Stephen Cohen is a 76 y.o. male who is being seen today for the evaluation of CHF, VF at the request of Doreene Eland, MD. Presenting today for electrophysiology evaluation.  3 oh systolic heart failure with an EF of 35%, coronary disease, peripheral arterial disease, paroxysmal atrial fibrillation, hyperlipidemia, hypertension, CABG in 1999 with OM1 and RCA stents in 2014, COPD continuing to smoke, and an ICD.  Per reports, his ejection fraction has been low recently at 20%.  He has not been compliant per notes with his heart failure regimen adding salt to his diet and continuing to smoke.    He presented to the hospital yesterday after an unresponsive episode.  Device interrogation showed a ventricular fibrillation event.  He had one unsuccessful shock, followed by a successful defibrillation that returned him to sinus rhythm.  Today, he denies symptoms of palpitations, chest pain, shortness of breath, orthopnea, PND, lower extremity edema, claudication, dizziness, presyncope, syncope, bleeding, or neurologic sequela. The patient is tolerating medications without difficulties.    Past Medical History:  Diagnosis Date  . Anginal pain (HCC)    no pain lately  . Arthritis   . Automatic implantable cardioverter-defibrillator in situ   . CAD in native artery 01/08/2016   Overview:  Cardiac cath 03/17/16: Conclusions Diagnostic Procedure Summary Severe global LV dysfunction. EF 35%, EDP=83mm Hg. Native RCA, LAD and Cx occluded. LIMA to LAD patent. SVG to RCA clear. Jump SVG to OM1 and OM2 patent. Has proximal stent, 25% narrowing. Diagnostic Procedure Recommendations Medical  Therapy because no interventional therapy is required.  . Cataracts, bilateral   . COPD (chronic obstructive pulmonary disease) (HCC)   . Coronary artery disease   . Essential hypertension 01/08/2016  . GERD (gastroesophageal reflux disease)   . Hiatal hernia   . HOH (hard of hearing)   . Hyperlipidemia   . Hypertension   . Ischemic dilated cardiomyopathy (HCC) 06/07/2015  . Myocardial infarction (HCC) 1995  . Pacemaker   . PAF (paroxysmal atrial fibrillation) (HCC) 01/08/2016  . Peptic ulcer disease   . Peripheral vascular disease (HCC)   . Ringing in ears   . Shortness of breath    Past Surgical History:  Procedure Laterality Date  . APPENDECTOMY    . COLONOSCOPY    . cornary stents  11/2012  . CORONARY ANGIOPLASTY     2 stents in 2014.  Marland Kitchen CORONARY ARTERY BYPASS GRAFT  1995  . FEMORAL-FEMORAL BYPASS GRAFT N/A 12/12/2013   Procedure: BYPASS GRAFT FEMORAL-FEMORAL ARTERY-  RIGHT TO LEFT using rifampin soaked hemashield graft;  Surgeon: Larina Earthly, MD;  Location: [facility] OR;  Service: Vascular;  Laterality: N/A;  . FEMORAL-FEMORAL BYPASS GRAFT Bilateral 07/03/2014   Procedure: REVISION OF Right FEMORAL to Left FEMORAL ARTERY BYPASS GRAFT with removal of Eroded graft;  Surgeon: Larina Earthly, MD;  Location: [facility] OR;  Service: Vascular;  Laterality: Bilateral;  . LEFT HEART CATH Left 11-11-12  . LEFT HEART CATHETERIZATION WITH CORONARY ANGIOGRAM N/A 11/11/2012   Procedure: LEFT HEART CATHETERIZATION WITH CORONARY ANGIOGRAM;  Surgeon: Pamella Pert, MD;  Location: [facility] CATH LAB;  Service: Cardiovascular;  Laterality: N/A;  .  Pace Maker  02/01/2013   St. Jude  . PERCUTANEOUS CORONARY STENT INTERVENTION (PCI-S)  11/11/2012   Procedure: PERCUTANEOUS CORONARY STENT INTERVENTION (PCI-S);  Surgeon: Pamella PertJagadeesh R Ganji, MD;  [facility];  Service: Cardiovascular;;  . PR VEIN BYPASS GRAFT,AORTO-FEM-POP  1995  2005   Right to left Fem-Fem with revision   . REMOVAL OF GRAFT Bilateral 12/12/2013    Procedure: REMOVAL OF Femoral- femoral hemashield GRAFT;  Surgeon: Larina Earthlyodd F Early, MD;  [facility];  Service: Vascular;  Laterality: Bilateral;     Current Outpatient Medications  Medication Sig Dispense Refill  . acetaminophen (TYLENOL) 500 MG tablet Take 2,500 mg by mouth daily.    Marland Kitchen. albuterol (PROAIR HFA) 108 (90 Base) MCG/ACT inhaler Inhale into the lungs every 6 (six) hours as needed for wheezing or shortness of breath.    Marland Kitchen. apixaban (ELIQUIS) 5 MG TABS tablet Take 5 mg by mouth 2 (two) times daily.    Marland Kitchen. aspirin EC 81 MG tablet Take 81 mg by mouth daily.    Marland Kitchen. atorvastatin (LIPITOR) 10 MG tablet Take 10 mg by mouth at bedtime.     . carvedilol (COREG) 3.125 MG tablet Take 1 tablet (3.125 mg total) by mouth 2 (two) times daily with a meal. 180 tablet 3  . diphenhydrAMINE (BENADRYL) 25 mg capsule Take 50 mg by mouth 4 (four) times daily.     Marland Kitchen. esomeprazole (NEXIUM) 20 MG capsule Take 20 mg by mouth daily.     . isosorbide mononitrate (IMDUR) 30 MG 24 hr tablet Take 1 tablet (30 mg total) by mouth daily. 90 tablet 3  . lisinopril (PRINIVIL,ZESTRIL) 5 MG tablet Take 1 tablet (5 mg total) by mouth daily. 90 tablet 3  . loperamide (LOPERAMIDE A-D) 2 MG tablet Take 2 mg by mouth 4 (four) times daily as needed for diarrhea or loose stools.    . Multiple Vitamins-Minerals (OCUVITE ADULT 50+ PO) Take 2 tablets by mouth daily.     . nitroGLYCERIN (NITROSTAT) 0.4 MG SL tablet Place 1 tablet (0.4 mg total) under the tongue every 5 (five) minutes as needed. For chest pain 25 tablet 0  . spironolactone (ALDACTONE) 25 MG tablet Take 1 tablet (25 mg total) by mouth daily. 90 tablet 3  . torsemide (DEMADEX) 20 MG tablet Take 1 tablet (20 mg total) by mouth 2 (two) times daily. Take an extra tablet 20 mg mid day every other day starting today (Patient taking differently: Take 20 mg by mouth See admin instructions. Take Twice daily then take an extra 20 mg mid day every other day) 270 tablet 3  .  umeclidinium-vilanterol (ANORO ELLIPTA) 62.5-25 MCG/INH AEPB Inhale 1 puff into the lungs daily.     No current facility-administered medications for this visit.     Allergies:   Patient has no known allergies.   Social History:  The patient  reports that he has been smoking cigarettes.  He has a 65.00 pack-year smoking history. he has never used smokeless tobacco. He reports that he does not drink alcohol or use drugs.   Family History:  The patient's family history includes Cancer in his sister; Heart attack in his mother and sister; Heart disease in his father, mother, and sister; Hyperlipidemia in his father, mother, and sister; Peripheral vascular disease in his sister.    ROS:  Please see the history of present illness.   Otherwise, review of systems is positive for chest pain, leg swelling, palpitations, hearing loss, dyspnea on  exertion, abdominal pain, diarrhea, difficulty urinating, back pain, easy bruising.   All other systems are reviewed and negative.    PHYSICAL EXAM: VS:  BP (!) 124/56   Pulse 80   Ht 5\' 11"  (1.803 m)   Wt 141 lb (64 kg)   SpO2 91%   BMI 19.67 kg/m  , BMI Body mass index is 19.67 kg/m. GEN: Well nourished, well developed, in no acute distress  HEENT: normal  Neck: no JVD, carotid bruits, or masses Cardiac: RRR; no murmurs, rubs, or gallops,no edema  Respiratory:  clear to auscultation bilaterally, normal work of breathing GI: soft, nontender, nondistended, + BS MS: no deformity or atrophy  Skin: warm and dry, device pocket is well healed Neuro:  Strength and sensation are intact Psych: euthymic mood, full affect  EKG:  EKG is ordered today. Personal review of the ekg ordered shows sinus rhythm, RBBB, LAFB, IMI  Device interrogation is reviewed today in detail.  See PaceArt for details.   Recent Labs: 10/22/2017: B Natriuretic Peptide 202.4; BUN 73; Creatinine, Ser 1.88; Hemoglobin 11.1; Magnesium 2.2; Platelets 309; Potassium 5.7; Sodium 131      Lipid Panel     Component Value Date/Time   CHOL 112 11/12/2012 0435   TRIG 117 11/12/2012 0435   HDL 25 (L) 11/12/2012 0435   CHOLHDL 4.5 11/12/2012 0435   VLDL 23 11/12/2012 0435   LDLCALC 64 11/12/2012 0435     Wt Readings from Last 3 Encounters:  10/23/17 141 lb (64 kg)  10/22/17 141 lb (64 kg)  10/22/17 141 lb (64 kg)      Other studies Reviewed: Additional studies/ records that were reviewed today include: 2017 [facility]  Review of the above records today demonstrates:  LMCA: Normal appearance with 0% stenosis. LAD: Chronic occlusion. Lesion on Mid LAD: Mid subsection.100% stenosis. Lesion on 1st Diag: Mid subsection.50% stenosis 5 mm length . LCx: Chronic occlusion. Lesion on Prox CX: Mid subsection.100% stenosis. RCA: Chronic occlusion. Lesion on Prox RCA: Proximal subsection.100% stenosis. Graft Lesions Lesion on Aorta Left to 1st Ob Marg (complex): Proximal body.25% stenosis 5 mm length . Cardiac Grafts  -There is a LIMA graft that originates at the LIMA and attaches to the Mid LAD.  -There is a Vein graft that originates at the Aorta Right and attaches to the Dist RCA.  -There is a Vein graft that originates at the Aorta Left and attaches to the 1st Ob Marg.The graft from 1st Ob Marg jumps to 2nd Ob Marg.   ASSESSMENT AND PLAN:  1.  Systolic heart failure due to ischemic cardiomyopathy: Jude dual-chamber ICD in place.  Did get shocked yesterday for ventricular tachycardia which degenerated his rhythm into ventricular fibrillation.  He had syncope and thus had a defibrillation which returned him to sinus rhythm.  He is currently asymptomatic today and feeling well.  Due to his episodes of tachycardia, we will plan to start him on amiodarone.  2.  Coronary artery disease status post CABG and PCI with chronic stable angina: Had chest pain earlier this week which was relieved with nitroglycerin.  He is on 30 mg of M door.  Will  increase to 60 mg.  3.  Paroxysmal atrial fibrillation: On eliquis. Currently in sinus rhythm.  No changes  This patients CHA2DS2-VASc Score and unadjusted Ischemic Stroke Rate (% per year) is equal to 4.8 % stroke rate/year from a score of 4  Above score calculated as 1 point each if present [CHF, HTN, DM, Vascular=MI/PAD/Aortic  Plaque, Age if 4265-74, or Male] Above score calculated as 2 points each if present [Age > 75, or Stroke/TIA/TE]  4.  VT/VF: Had syncope with abnormal rhythms.  Also had defibrillation x2.  Adjustments made to his device for further therapy with a monitor zone at 150 bpm.  We will plan to start him on amiodarone today.  I did discuss with him no [place] 6 months.  Current medicines are reviewed at length with the patient today.   The patient does not have concerns regarding his medicines.  The following changes were made today: Heart amiodarone, increase Imdur  Labs/ tests ordered today include:  Orders Placed This Encounter  Procedures  . EKG 12-Lead     Disposition:   FU with Will Camnitz 3 months  Signed, Will Jorja LoaMartin Camnitz, MD  10/23/2017 2:49 PM     [facility] [address] [place] [address] [phone] (office) [phone] (fax)

## 2017-10-24 ENCOUNTER — Other Ambulatory Visit: Payer: Self-pay

## 2017-10-24 ENCOUNTER — Encounter (HOSPITAL_COMMUNITY): Admission: RE | Disposition: A | Discharge status: Hospice | Payer: Self-pay | Source: Home / Self Care

## 2017-10-24 ENCOUNTER — Emergency Department (HOSPITAL_COMMUNITY): Payer: Medicare Other | Admitting: Certified Registered Nurse Anesthetist

## 2017-10-24 ENCOUNTER — Encounter (HOSPITAL_COMMUNITY): Payer: Self-pay | Admitting: Emergency Medicine

## 2017-10-24 ENCOUNTER — Inpatient Hospital Stay (HOSPITAL_COMMUNITY): Payer: Medicare Other

## 2017-10-24 ENCOUNTER — Emergency Department (HOSPITAL_COMMUNITY): Payer: Medicare Other

## 2017-10-24 ENCOUNTER — Inpatient Hospital Stay (HOSPITAL_COMMUNITY)
Admission: RE | Admit: 2017-10-24 | Discharge: 2017-10-30 | DRG: 853 | Disposition: A | Discharge status: Hospice | Payer: Medicare Other | Attending: General Surgery | Admitting: General Surgery

## 2017-10-24 DIAGNOSIS — I480 Paroxysmal atrial fibrillation: Secondary | ICD-10-CM | POA: Diagnosis present

## 2017-10-24 DIAGNOSIS — N170 Acute kidney failure with tubular necrosis: Secondary | ICD-10-CM

## 2017-10-24 DIAGNOSIS — R451 Restlessness and agitation: Secondary | ICD-10-CM | POA: Diagnosis not present

## 2017-10-24 DIAGNOSIS — I739 Peripheral vascular disease, unspecified: Secondary | ICD-10-CM | POA: Diagnosis present

## 2017-10-24 DIAGNOSIS — F17210 Nicotine dependence, cigarettes, uncomplicated: Secondary | ICD-10-CM | POA: Diagnosis present

## 2017-10-24 DIAGNOSIS — D6959 Other secondary thrombocytopenia: Secondary | ICD-10-CM | POA: Diagnosis present

## 2017-10-24 DIAGNOSIS — I5030 Unspecified diastolic (congestive) heart failure: Secondary | ICD-10-CM | POA: Diagnosis not present

## 2017-10-24 DIAGNOSIS — I248 Other forms of acute ischemic heart disease: Secondary | ICD-10-CM | POA: Diagnosis present

## 2017-10-24 DIAGNOSIS — J84112 Idiopathic pulmonary fibrosis: Secondary | ICD-10-CM | POA: Diagnosis present

## 2017-10-24 DIAGNOSIS — J9811 Atelectasis: Secondary | ICD-10-CM | POA: Diagnosis not present

## 2017-10-24 DIAGNOSIS — E785 Hyperlipidemia, unspecified: Secondary | ICD-10-CM | POA: Diagnosis present

## 2017-10-24 DIAGNOSIS — Z95810 Presence of automatic (implantable) cardiac defibrillator: Secondary | ICD-10-CM

## 2017-10-24 DIAGNOSIS — K559 Vascular disorder of intestine, unspecified: Secondary | ICD-10-CM | POA: Diagnosis present

## 2017-10-24 DIAGNOSIS — A419 Sepsis, unspecified organism: Secondary | ICD-10-CM | POA: Diagnosis present

## 2017-10-24 DIAGNOSIS — I255 Ischemic cardiomyopathy: Secondary | ICD-10-CM | POA: Diagnosis present

## 2017-10-24 DIAGNOSIS — Z6823 Body mass index (BMI) 23.0-23.9, adult: Secondary | ICD-10-CM | POA: Diagnosis not present

## 2017-10-24 DIAGNOSIS — I25119 Atherosclerotic heart disease of native coronary artery with unspecified angina pectoris: Secondary | ICD-10-CM | POA: Diagnosis present

## 2017-10-24 DIAGNOSIS — R6521 Severe sepsis with septic shock: Secondary | ICD-10-CM | POA: Diagnosis present

## 2017-10-24 DIAGNOSIS — Z7901 Long term (current) use of anticoagulants: Secondary | ICD-10-CM

## 2017-10-24 DIAGNOSIS — D62 Acute posthemorrhagic anemia: Secondary | ICD-10-CM | POA: Diagnosis not present

## 2017-10-24 DIAGNOSIS — K219 Gastro-esophageal reflux disease without esophagitis: Secondary | ICD-10-CM | POA: Diagnosis present

## 2017-10-24 DIAGNOSIS — K4031 Unilateral inguinal hernia, with obstruction, without gangrene, recurrent: Secondary | ICD-10-CM | POA: Diagnosis present

## 2017-10-24 DIAGNOSIS — K659 Peritonitis, unspecified: Secondary | ICD-10-CM | POA: Diagnosis present

## 2017-10-24 DIAGNOSIS — Z8349 Family history of other endocrine, nutritional and metabolic diseases: Secondary | ICD-10-CM

## 2017-10-24 DIAGNOSIS — E870 Hyperosmolality and hypernatremia: Secondary | ICD-10-CM | POA: Diagnosis present

## 2017-10-24 DIAGNOSIS — K4030 Unilateral inguinal hernia, with obstruction, without gangrene, not specified as recurrent: Secondary | ICD-10-CM | POA: Diagnosis not present

## 2017-10-24 DIAGNOSIS — M1990 Unspecified osteoarthritis, unspecified site: Secondary | ICD-10-CM | POA: Diagnosis present

## 2017-10-24 DIAGNOSIS — J449 Chronic obstructive pulmonary disease, unspecified: Secondary | ICD-10-CM | POA: Diagnosis present

## 2017-10-24 DIAGNOSIS — R571 Hypovolemic shock: Secondary | ICD-10-CM | POA: Diagnosis present

## 2017-10-24 DIAGNOSIS — J9601 Acute respiratory failure with hypoxia: Secondary | ICD-10-CM | POA: Diagnosis not present

## 2017-10-24 DIAGNOSIS — J9690 Respiratory failure, unspecified, unspecified whether with hypoxia or hypercapnia: Secondary | ICD-10-CM

## 2017-10-24 DIAGNOSIS — Z7982 Long term (current) use of aspirin: Secondary | ICD-10-CM

## 2017-10-24 DIAGNOSIS — Z955 Presence of coronary angioplasty implant and graft: Secondary | ICD-10-CM

## 2017-10-24 DIAGNOSIS — E875 Hyperkalemia: Secondary | ICD-10-CM

## 2017-10-24 DIAGNOSIS — R748 Abnormal levels of other serum enzymes: Secondary | ICD-10-CM | POA: Diagnosis present

## 2017-10-24 DIAGNOSIS — H9190 Unspecified hearing loss, unspecified ear: Secondary | ICD-10-CM | POA: Diagnosis present

## 2017-10-24 DIAGNOSIS — I252 Old myocardial infarction: Secondary | ICD-10-CM

## 2017-10-24 DIAGNOSIS — E440 Moderate protein-calorie malnutrition: Secondary | ICD-10-CM | POA: Diagnosis present

## 2017-10-24 DIAGNOSIS — I5022 Chronic systolic (congestive) heart failure: Secondary | ICD-10-CM | POA: Diagnosis present

## 2017-10-24 DIAGNOSIS — I4901 Ventricular fibrillation: Secondary | ICD-10-CM | POA: Diagnosis not present

## 2017-10-24 DIAGNOSIS — K460 Unspecified abdominal hernia with obstruction, without gangrene: Secondary | ICD-10-CM

## 2017-10-24 DIAGNOSIS — J95821 Acute postprocedural respiratory failure: Secondary | ICD-10-CM | POA: Diagnosis not present

## 2017-10-24 DIAGNOSIS — I110 Hypertensive heart disease with heart failure: Secondary | ICD-10-CM | POA: Diagnosis present

## 2017-10-24 DIAGNOSIS — L8990 Pressure ulcer of unspecified site, unspecified stage: Secondary | ICD-10-CM | POA: Diagnosis present

## 2017-10-24 DIAGNOSIS — Z9911 Dependence on respirator [ventilator] status: Secondary | ICD-10-CM | POA: Diagnosis not present

## 2017-10-24 DIAGNOSIS — Z951 Presence of aortocoronary bypass graft: Secondary | ICD-10-CM

## 2017-10-24 DIAGNOSIS — Z8249 Family history of ischemic heart disease and other diseases of the circulatory system: Secondary | ICD-10-CM

## 2017-10-24 DIAGNOSIS — J9600 Acute respiratory failure, unspecified whether with hypoxia or hypercapnia: Secondary | ICD-10-CM

## 2017-10-24 DIAGNOSIS — E872 Acidosis, unspecified: Secondary | ICD-10-CM

## 2017-10-24 DIAGNOSIS — E162 Hypoglycemia, unspecified: Secondary | ICD-10-CM | POA: Diagnosis present

## 2017-10-24 DIAGNOSIS — Z8711 Personal history of peptic ulcer disease: Secondary | ICD-10-CM

## 2017-10-24 DIAGNOSIS — Z66 Do not resuscitate: Secondary | ICD-10-CM | POA: Diagnosis not present

## 2017-10-24 DIAGNOSIS — Z9981 Dependence on supplemental oxygen: Secondary | ICD-10-CM

## 2017-10-24 HISTORY — PX: COLOSTOMY: SHX63

## 2017-10-24 HISTORY — PX: BOWEL RESECTION: SHX1257

## 2017-10-24 HISTORY — PX: INGUINAL HERNIA REPAIR: SHX194

## 2017-10-24 HISTORY — PX: LAPAROSCOPY: SHX197

## 2017-10-24 HISTORY — PX: LAPAROTOMY: SHX154

## 2017-10-24 LAB — CUP PACEART INCLINIC DEVICE CHECK
Implantable Lead Implant Date: 20140325
Implantable Lead Model: 7122
Implantable Pulse Generator Implant Date: 20140325
MDC IDC LEAD IMPLANT DT: 20140325
MDC IDC LEAD LOCATION: 753859
MDC IDC LEAD LOCATION: 753860
MDC IDC SESS DTM: 20181215205051
Pulse Gen Serial Number: 7056403

## 2017-10-24 LAB — POCT I-STAT 7, (LYTES, BLD GAS, ICA,H+H)
ACID-BASE DEFICIT: 14 mmol/L — AB (ref 0.0–2.0)
ACID-BASE DEFICIT: 7 mmol/L — AB (ref 0.0–2.0)
BICARBONATE: 20.1 mmol/L (ref 20.0–28.0)
Bicarbonate: 14.2 mmol/L — ABNORMAL LOW (ref 20.0–28.0)
CALCIUM ION: 1.39 mmol/L (ref 1.15–1.40)
Calcium, Ion: 1.04 mmol/L — ABNORMAL LOW (ref 1.15–1.40)
HCT: 29 % — ABNORMAL LOW (ref 39.0–52.0)
HEMATOCRIT: 27 % — AB (ref 39.0–52.0)
HEMOGLOBIN: 9.2 g/dL — AB (ref 13.0–17.0)
Hemoglobin: 9.9 g/dL — ABNORMAL LOW (ref 13.0–17.0)
O2 SAT: 100 %
O2 SAT: 100 %
PCO2 ART: 40.2 mmHg (ref 32.0–48.0)
PH ART: 7.156 — AB (ref 7.350–7.450)
PH ART: 7.236 — AB (ref 7.350–7.450)
PO2 ART: 296 mmHg — AB (ref 83.0–108.0)
PO2 ART: 357 mmHg — AB (ref 83.0–108.0)
POTASSIUM: 5.2 mmol/L — AB (ref 3.5–5.1)
Patient temperature: 37
Potassium: 5.5 mmol/L — ABNORMAL HIGH (ref 3.5–5.1)
SODIUM: 145 mmol/L (ref 135–145)
Sodium: 141 mmol/L (ref 135–145)
TCO2: 15 mmol/L — ABNORMAL LOW (ref 22–32)
TCO2: 22 mmol/L (ref 22–32)
pCO2 arterial: 47.5 mmHg (ref 32.0–48.0)

## 2017-10-24 LAB — URINALYSIS, ROUTINE W REFLEX MICROSCOPIC
BILIRUBIN URINE: NEGATIVE
GLUCOSE, UA: NEGATIVE mg/dL
KETONES UR: NEGATIVE mg/dL
LEUKOCYTES UA: NEGATIVE
Nitrite: NEGATIVE
PH: 5 (ref 5.0–8.0)
Protein, ur: 30 mg/dL — AB
SPECIFIC GRAVITY, URINE: 1.015 (ref 1.005–1.030)

## 2017-10-24 LAB — POCT I-STAT, CHEM 8
BUN: 63 mg/dL — AB (ref 6–20)
CHLORIDE: 110 mmol/L (ref 101–111)
CREATININE: 2.1 mg/dL — AB (ref 0.61–1.24)
Calcium, Ion: 0.98 mmol/L — ABNORMAL LOW (ref 1.15–1.40)
Glucose, Bld: 136 mg/dL — ABNORMAL HIGH (ref 65–99)
HEMATOCRIT: 30 % — AB (ref 39.0–52.0)
Hemoglobin: 10.2 g/dL — ABNORMAL LOW (ref 13.0–17.0)
Potassium: 5.4 mmol/L — ABNORMAL HIGH (ref 3.5–5.1)
SODIUM: 146 mmol/L — AB (ref 135–145)
TCO2: 19 mmol/L — ABNORMAL LOW (ref 22–32)

## 2017-10-24 LAB — POCT I-STAT 3, ART BLOOD GAS (G3+)
ACID-BASE DEFICIT: 13 mmol/L — AB (ref 0.0–2.0)
Acid-base deficit: 7 mmol/L — ABNORMAL HIGH (ref 0.0–2.0)
BICARBONATE: 18.5 mmol/L — AB (ref 20.0–28.0)
Bicarbonate: 16.1 mmol/L — ABNORMAL LOW (ref 20.0–28.0)
O2 Saturation: 100 %
O2 Saturation: 97 %
PCO2 ART: 38.6 mmHg (ref 32.0–48.0)
PH ART: 7.136 — AB (ref 7.350–7.450)
PH ART: 7.289 — AB (ref 7.350–7.450)
PO2 ART: 96 mmHg (ref 83.0–108.0)
TCO2: 18 mmol/L — ABNORMAL LOW (ref 22–32)
TCO2: 20 mmol/L — ABNORMAL LOW (ref 22–32)
pCO2 arterial: 47.2 mmHg (ref 32.0–48.0)
pO2, Arterial: 318 mmHg — ABNORMAL HIGH (ref 83.0–108.0)

## 2017-10-24 LAB — PROTIME-INR
INR: 1.46
INR: 1.93
Prothrombin Time: 17.6 seconds — ABNORMAL HIGH (ref 11.4–15.2)
Prothrombin Time: 21.9 seconds — ABNORMAL HIGH (ref 11.4–15.2)

## 2017-10-24 LAB — CBC
HCT: 31.8 % — ABNORMAL LOW (ref 39.0–52.0)
Hemoglobin: 10.4 g/dL — ABNORMAL LOW (ref 13.0–17.0)
MCH: 31 pg (ref 26.0–34.0)
MCHC: 32.7 g/dL (ref 30.0–36.0)
MCV: 94.9 fL (ref 78.0–100.0)
Platelets: 163 10*3/uL (ref 150–400)
RBC: 3.35 MIL/uL — ABNORMAL LOW (ref 4.22–5.81)
RDW: 15.6 % — ABNORMAL HIGH (ref 11.5–15.5)
WBC: 33.2 10*3/uL — ABNORMAL HIGH (ref 4.0–10.5)

## 2017-10-24 LAB — I-STAT CHEM 8, ED
BUN: 104 mg/dL — AB (ref 6–20)
BUN: 111 mg/dL — ABNORMAL HIGH (ref 6–20)
CHLORIDE: 104 mmol/L (ref 101–111)
CHLORIDE: 108 mmol/L (ref 101–111)
CREATININE: 3.7 mg/dL — AB (ref 0.61–1.24)
Calcium, Ion: 1 mmol/L — ABNORMAL LOW (ref 1.15–1.40)
Calcium, Ion: 0.97 mmol/L — ABNORMAL LOW (ref 1.15–1.40)
Creatinine, Ser: 3.5 mg/dL — ABNORMAL HIGH (ref 0.61–1.24)
GLUCOSE: 120 mg/dL — AB (ref 65–99)
GLUCOSE: 138 mg/dL — AB (ref 65–99)
HCT: 45 % (ref 39.0–52.0)
HEMATOCRIT: 31 % — AB (ref 39.0–52.0)
HEMOGLOBIN: 15.3 g/dL (ref 13.0–17.0)
Hemoglobin: 10.5 g/dL — ABNORMAL LOW (ref 13.0–17.0)
POTASSIUM: 6.3 mmol/L — AB (ref 3.5–5.1)
POTASSIUM: 6.1 mmol/L — AB (ref 3.5–5.1)
Sodium: 136 mmol/L (ref 135–145)
Sodium: 138 mmol/L (ref 135–145)
TCO2: 18 mmol/L — ABNORMAL LOW (ref 22–32)
TCO2: 16 mmol/L — ABNORMAL LOW (ref 22–32)

## 2017-10-24 LAB — BRAIN NATRIURETIC PEPTIDE: B Natriuretic Peptide: 614.5 pg/mL — ABNORMAL HIGH (ref 0.0–100.0)

## 2017-10-24 LAB — COMPREHENSIVE METABOLIC PANEL
ALK PHOS: 110 U/L (ref 38–126)
ALT: 26 U/L (ref 17–63)
ALT: 23 U/L (ref 17–63)
ANION GAP: 22 — AB (ref 5–15)
AST: 58 U/L — ABNORMAL HIGH (ref 15–41)
AST: 46 U/L — ABNORMAL HIGH (ref 15–41)
Albumin: 3.7 g/dL (ref 3.5–5.0)
Albumin: 2.5 g/dL — ABNORMAL LOW (ref 3.5–5.0)
Alkaline Phosphatase: 76 U/L (ref 38–126)
Anion gap: 12 (ref 5–15)
BUN: 109 mg/dL — ABNORMAL HIGH (ref 6–20)
BUN: 85 mg/dL — ABNORMAL HIGH (ref 6–20)
CALCIUM: 8.7 mg/dL — AB (ref 8.9–10.3)
CO2: 15 mmol/L — ABNORMAL LOW (ref 22–32)
CO2: 15 mmol/L — ABNORMAL LOW (ref 22–32)
CREATININE: 4.07 mg/dL — AB (ref 0.61–1.24)
Calcium: 6.9 mg/dL — ABNORMAL LOW (ref 8.9–10.3)
Chloride: 99 mmol/L — ABNORMAL LOW (ref 101–111)
Chloride: 114 mmol/L — ABNORMAL HIGH (ref 101–111)
Creatinine, Ser: 2.79 mg/dL — ABNORMAL HIGH (ref 0.61–1.24)
GFR calc Af Amer: 24 mL/min — ABNORMAL LOW (ref >60)
GFR calc non Af Amer: 13 mL/min — ABNORMAL LOW (ref >60)
GFR calc non Af Amer: 21 mL/min — ABNORMAL LOW (ref >60)
GFR, EST AFRICAN AMERICAN: 15 mL/min — AB (ref >60)
GLUCOSE: 115 mg/dL — AB (ref 65–99)
Glucose, Bld: 72 mg/dL (ref 65–99)
Potassium: 6.5 mmol/L (ref 3.5–5.1)
Potassium: 6 mmol/L — ABNORMAL HIGH (ref 3.5–5.1)
Sodium: 136 mmol/L (ref 135–145)
Sodium: 141 mmol/L (ref 135–145)
TOTAL PROTEIN: 7.5 g/dL (ref 6.5–8.1)
Total Bilirubin: 1.2 mg/dL (ref 0.3–1.2)
Total Bilirubin: 1.8 mg/dL — ABNORMAL HIGH (ref 0.3–1.2)
Total Protein: 4.5 g/dL — ABNORMAL LOW (ref 6.5–8.1)

## 2017-10-24 LAB — I-STAT CG4 LACTIC ACID, ED
Lactic Acid, Venous: 5.77 mmol/L (ref 0.5–1.9)
Lactic Acid, Venous: 3.68 mmol/L (ref 0.5–1.9)

## 2017-10-24 LAB — LIPASE, BLOOD: Lipase: 25 U/L (ref 11–51)

## 2017-10-24 LAB — CBC WITH DIFFERENTIAL/PLATELET
Basophils Absolute: 0 10*3/uL (ref 0.0–0.1)
Basophils Relative: 0 %
EOS PCT: 0 %
Eosinophils Absolute: 0 10*3/uL (ref 0.0–0.7)
HEMATOCRIT: 39.2 % (ref 39.0–52.0)
Hemoglobin: 12.5 g/dL — ABNORMAL LOW (ref 13.0–17.0)
LYMPHS ABS: 2.6 10*3/uL (ref 0.7–4.0)
Lymphocytes Relative: 6 %
MCH: 31 pg (ref 26.0–34.0)
MCHC: 31.9 g/dL (ref 30.0–36.0)
MCV: 97.3 fL (ref 78.0–100.0)
MONO ABS: 1.3 10*3/uL — AB (ref 0.1–1.0)
Monocytes Relative: 3 %
NEUTROS ABS: 39.7 10*3/uL — AB (ref 1.7–7.7)
Neutrophils Relative %: 91 %
Platelets: 286 10*3/uL (ref 150–400)
RBC: 4.03 MIL/uL — AB (ref 4.22–5.81)
RDW: 15.1 % (ref 11.5–15.5)
WBC Morphology: INCREASED
WBC: 43.6 10*3/uL — AB (ref 4.0–10.5)

## 2017-10-24 LAB — CBG MONITORING, ED: Glucose-Capillary: 71 mg/dL (ref 65–99)

## 2017-10-24 LAB — MAGNESIUM: Magnesium: 1.8 mg/dL (ref 1.7–2.4)

## 2017-10-24 LAB — APTT: aPTT: 46 seconds — ABNORMAL HIGH (ref 24–36)

## 2017-10-24 LAB — LACTIC ACID, PLASMA
Lactic Acid, Venous: 3.6 mmol/L (ref 0.5–1.9)
Lactic Acid, Venous: 5.4 mmol/L (ref 0.5–1.9)

## 2017-10-24 LAB — MRSA PCR SCREENING: MRSA by PCR: NEGATIVE

## 2017-10-24 LAB — GLUCOSE, CAPILLARY: GLUCOSE-CAPILLARY: 63 mg/dL — AB (ref 65–99)

## 2017-10-24 LAB — PROCALCITONIN: Procalcitonin: 36.02 ng/mL

## 2017-10-24 LAB — TROPONIN I: Troponin I: 0.04 ng/mL (ref <0.03)

## 2017-10-24 LAB — TRIGLYCERIDES: Triglycerides: 45 mg/dL (ref <150)

## 2017-10-24 LAB — PHOSPHORUS: Phosphorus: 9.1 mg/dL — ABNORMAL HIGH (ref 2.5–4.6)

## 2017-10-24 LAB — PREPARE RBC (CROSSMATCH)

## 2017-10-24 SURGERY — REPAIR, HERNIA, INGUINAL, ADULT
Anesthesia: General | Site: Abdomen

## 2017-10-24 MED ORDER — ONDANSETRON HCL 4 MG/2ML IJ SOLN
INTRAMUSCULAR | Status: AC
  Filled 2017-10-24: qty 2

## 2017-10-24 MED ORDER — PIPERACILLIN-TAZOBACTAM 3.375 G IVPB: 3.3750 g | Freq: Three times a day (TID) | INTRAVENOUS | Status: DC

## 2017-10-24 MED ORDER — LIDOCAINE HCL (CARDIAC) 20 MG/ML IV SOLN
INTRAVENOUS | Status: DC | PRN
  Administered 2017-10-24: 70 mg via INTRAVENOUS

## 2017-10-24 MED ORDER — NOREPINEPHRINE BITARTRATE 1 MG/ML IV SOLN
0.0000 ug/min | INTRAVENOUS | Status: AC
  Administered 2017-10-24: 2 ug/min via INTRAVENOUS
  Filled 2017-10-24: qty 4

## 2017-10-24 MED ORDER — DEXTROSE 5 % IV SOLN: INTRAVENOUS | Status: DC | PRN

## 2017-10-24 MED ORDER — SODIUM CHLORIDE 0.9 % IV BOLUS (SEPSIS)
1000.0000 mL | Freq: Once | INTRAVENOUS | Status: AC
  Administered 2017-10-24: 1000 mL via INTRAVENOUS

## 2017-10-24 MED ORDER — NOREPINEPHRINE BITARTRATE 1 MG/ML IV SOLN
0.0000 ug/min | INTRAVENOUS | Status: DC
Start: 2017-10-24 — End: 2017-10-24
  Administered 2017-10-24: 5 ug/min via INTRAVENOUS
  Filled 2017-10-24: qty 4

## 2017-10-24 MED ORDER — LACTATED RINGERS IV BOLUS (SEPSIS)
1000.0000 mL | Freq: Once | INTRAVENOUS | Status: AC
  Administered 2017-10-24: 1000 mL via INTRAVENOUS

## 2017-10-24 MED ORDER — FENTANYL CITRATE (PF) 100 MCG/2ML IJ SOLN
50.0000 ug | Freq: Once | INTRAMUSCULAR | Status: AC
  Administered 2017-10-24: 50 ug via INTRAVENOUS

## 2017-10-24 MED ORDER — DIPHENHYDRAMINE HCL 50 MG/ML IJ SOLN: 12.5000 mg | Freq: Four times a day (QID) | INTRAMUSCULAR | Status: DC | PRN

## 2017-10-24 MED ORDER — DEXTROSE 50 % IV SOLN
1.0000 | Freq: Once | INTRAVENOUS | Status: AC
  Administered 2017-10-24: 50 mL via INTRAVENOUS
  Filled 2017-10-24: qty 50

## 2017-10-24 MED ORDER — EPHEDRINE 5 MG/ML INJ
INTRAVENOUS | Status: AC
  Filled 2017-10-24: qty 10

## 2017-10-24 MED ORDER — ALBUMIN HUMAN 5 % IV SOLN
INTRAVENOUS | Status: DC | PRN
  Administered 2017-10-24 (×2): via INTRAVENOUS

## 2017-10-24 MED ORDER — BUPIVACAINE HCL (PF) 0.25 % IJ SOLN
INTRAMUSCULAR | Status: AC
Start: 2017-10-24 — End: 2017-10-24
  Filled 2017-10-24: qty 30

## 2017-10-24 MED ORDER — HEPARIN SODIUM (PORCINE) 5000 UNIT/ML IJ SOLN: 5000.0000 [IU] | Freq: Three times a day (TID) | INTRAMUSCULAR | Status: DC

## 2017-10-24 MED ORDER — PROPOFOL 1000 MG/100ML IV EMUL
5.0000 ug/kg/min | INTRAVENOUS | Status: DC
  Administered 2017-10-24: 5 ug/kg/min via INTRAVENOUS
  Administered 2017-10-25: 40 ug/kg/min via INTRAVENOUS
  Administered 2017-10-25: 25 ug/kg/min via INTRAVENOUS
  Administered 2017-10-26: 10 ug/kg/min via INTRAVENOUS
  Administered 2017-10-26: 30 ug/kg/min via INTRAVENOUS
  Administered 2017-10-27: 15 ug/kg/min via INTRAVENOUS
  Filled 2017-10-24 (×6): qty 100

## 2017-10-24 MED ORDER — IOPAMIDOL (ISOVUE-300) INJECTION 61%
INTRAVENOUS | Status: AC
  Filled 2017-10-24: qty 75

## 2017-10-24 MED ORDER — CALCIUM CHLORIDE 10 % IV SOLN
INTRAVENOUS | Status: DC | PRN
  Administered 2017-10-24: 200 mg via INTRAVENOUS
  Administered 2017-10-24 (×3): 100 mg via INTRAVENOUS
  Administered 2017-10-24: 500 mg via INTRAVENOUS

## 2017-10-24 MED ORDER — LIDOCAINE 2% (20 MG/ML) 5 ML SYRINGE
INTRAMUSCULAR | Status: AC
  Filled 2017-10-24: qty 5

## 2017-10-24 MED ORDER — SUCCINYLCHOLINE CHLORIDE 200 MG/10ML IV SOSY
PREFILLED_SYRINGE | INTRAVENOUS | Status: AC
  Filled 2017-10-24: qty 10

## 2017-10-24 MED ORDER — PIPERACILLIN-TAZOBACTAM IN DEX 2-0.25 GM/50ML IV SOLN
2.2500 g | Freq: Four times a day (QID) | INTRAVENOUS | Status: DC
  Administered 2017-10-24 – 2017-10-25 (×3): 2.25 g via INTRAVENOUS
  Filled 2017-10-24 (×5): qty 50

## 2017-10-24 MED ORDER — LACTATED RINGERS IV SOLN
INTRAVENOUS | Status: DC
  Administered 2017-10-25: via INTRAVENOUS

## 2017-10-24 MED ORDER — PHENYLEPHRINE 40 MCG/ML (10ML) SYRINGE FOR IV PUSH (FOR BLOOD PRESSURE SUPPORT)
PREFILLED_SYRINGE | INTRAVENOUS | Status: AC
  Filled 2017-10-24: qty 10

## 2017-10-24 MED ORDER — PROPOFOL 1000 MG/100ML IV EMUL: 5.0000 ug/kg/min | INTRAVENOUS | Status: DC

## 2017-10-24 MED ORDER — ACETAMINOPHEN 650 MG RE SUPP
650.0000 mg | Freq: Four times a day (QID) | RECTAL | Status: DC | PRN
Start: 2017-10-24 — End: 2017-10-30

## 2017-10-24 MED ORDER — SODIUM CHLORIDE 0.9 % IV SOLN
2.0000 g | Freq: Once | INTRAVENOUS | Status: AC
  Administered 2017-10-24: 2 g via INTRAVENOUS
  Filled 2017-10-24: qty 20

## 2017-10-24 MED ORDER — 0.9 % SODIUM CHLORIDE (POUR BTL) OPTIME
TOPICAL | Status: DC | PRN
  Administered 2017-10-24: 1000 mL
  Administered 2017-10-24: 2000 mL
  Administered 2017-10-24: 1000 mL
  Administered 2017-10-24: 3000 mL

## 2017-10-24 MED ORDER — MIDAZOLAM HCL 2 MG/2ML IJ SOLN
INTRAMUSCULAR | Status: AC
  Filled 2017-10-24: qty 2

## 2017-10-24 MED ORDER — DEXTROSE 50 % IV SOLN
INTRAVENOUS | Status: AC
  Administered 2017-10-24: 50 mL via INTRAVENOUS
  Filled 2017-10-24: qty 50

## 2017-10-24 MED ORDER — SODIUM BICARBONATE 8.4 % IV SOLN
200.0000 meq | Freq: Once | INTRAVENOUS | Status: AC
Start: 2017-10-24 — End: 2017-10-24
  Administered 2017-10-24: 200 meq via INTRAVENOUS

## 2017-10-24 MED ORDER — FENTANYL CITRATE (PF) 100 MCG/2ML IJ SOLN
INTRAMUSCULAR | Status: DC | PRN
  Administered 2017-10-24: 25 ug via INTRAVENOUS

## 2017-10-24 MED ORDER — SODIUM CHLORIDE 0.9 % IV SOLN: INTRAVENOUS | Status: DC

## 2017-10-24 MED ORDER — METOPROLOL TARTRATE 5 MG/5ML IV SOLN: 5.0000 mg | Freq: Four times a day (QID) | INTRAVENOUS | Status: DC | PRN

## 2017-10-24 MED ORDER — NOREPINEPHRINE BITARTRATE 1 MG/ML IV SOLN
0.0000 ug/min | INTRAVENOUS | Status: DC
  Administered 2017-10-24: 5 ug/min via INTRAVENOUS
  Administered 2017-10-25: 20 ug/min via INTRAVENOUS
  Filled 2017-10-24 (×2): qty 16

## 2017-10-24 MED ORDER — ROCURONIUM BROMIDE 100 MG/10ML IV SOLN
INTRAVENOUS | Status: DC | PRN
  Administered 2017-10-24 (×2): 50 mg via INTRAVENOUS

## 2017-10-24 MED ORDER — SODIUM CHLORIDE 0.9 % IV SOLN
INTRAVENOUS | Status: DC | PRN
  Administered 2017-10-24 (×2): via INTRAVENOUS

## 2017-10-24 MED ORDER — NITROGLYCERIN 0.4 MG SL SUBL: 0.4000 mg | SUBLINGUAL_TABLET | SUBLINGUAL | Status: DC | PRN

## 2017-10-24 MED ORDER — CHLORHEXIDINE GLUCONATE 0.12% ORAL RINSE (MEDLINE KIT)
15.0000 mL | Freq: Two times a day (BID) | OROMUCOSAL | Status: DC
  Administered 2017-10-24 – 2017-10-30 (×12): 15 mL via OROMUCOSAL

## 2017-10-24 MED ORDER — ORAL CARE MOUTH RINSE
15.0000 mL | Freq: Four times a day (QID) | OROMUCOSAL | Status: DC
  Administered 2017-10-25 – 2017-10-30 (×23): 15 mL via OROMUCOSAL

## 2017-10-24 MED ORDER — LIDOCAINE HCL 1 % IJ SOLN
INTRAMUSCULAR | Status: AC
  Filled 2017-10-24: qty 20

## 2017-10-24 MED ORDER — SODIUM CHLORIDE 0.9 % IV SOLN
0.0000 ug/min | INTRAVENOUS | Status: DC
  Administered 2017-10-26: 20 ug/min via INTRAVENOUS
  Administered 2017-10-26: 100 ug/min via INTRAVENOUS
  Administered 2017-10-26: 125 ug/min via INTRAVENOUS
  Administered 2017-10-26: 210 ug/min via INTRAVENOUS
  Filled 2017-10-24 (×4): qty 4

## 2017-10-24 MED ORDER — PROPOFOL 10 MG/ML IV BOLUS
INTRAVENOUS | Status: AC
  Filled 2017-10-24: qty 20

## 2017-10-24 MED ORDER — DIPHENHYDRAMINE HCL 12.5 MG/5ML PO ELIX
12.5000 mg | ORAL_SOLUTION | Freq: Four times a day (QID) | ORAL | Status: DC | PRN
  Filled 2017-10-24: qty 5

## 2017-10-24 MED ORDER — PANTOPRAZOLE SODIUM 40 MG IV SOLR
40.0000 mg | Freq: Every day | INTRAVENOUS | Status: DC
  Administered 2017-10-24 – 2017-10-29 (×6): 40 mg via INTRAVENOUS
  Filled 2017-10-24 (×6): qty 40

## 2017-10-24 MED ORDER — SODIUM CHLORIDE 0.9 % IV SOLN: 250.0000 mL | INTRAVENOUS | Status: DC | PRN

## 2017-10-24 MED ORDER — PIPERACILLIN-TAZOBACTAM 3.375 G IVPB 30 MIN
3.3750 g | Freq: Once | INTRAVENOUS | Status: AC
  Administered 2017-10-24: 3.375 g via INTRAVENOUS
  Filled 2017-10-24: qty 50

## 2017-10-24 MED ORDER — SODIUM CHLORIDE 0.9 % IV SOLN
INTRAVENOUS | Status: DC | PRN
  Administered 2017-10-24 (×2): via INTRAVENOUS

## 2017-10-24 MED ORDER — EPHEDRINE SULFATE 50 MG/ML IJ SOLN
INTRAMUSCULAR | Status: DC | PRN
  Administered 2017-10-24: 10 mg via INTRAVENOUS

## 2017-10-24 MED ORDER — PHENYLEPHRINE HCL 10 MG/ML IJ SOLN
0.0000 ug/min | INTRAMUSCULAR | Status: DC
  Filled 2017-10-24: qty 1

## 2017-10-24 MED ORDER — PROMETHAZINE HCL 25 MG/ML IJ SOLN
12.5000 mg | Freq: Once | INTRAMUSCULAR | Status: AC
  Administered 2017-10-24: 12.5 mg via INTRAVENOUS
  Filled 2017-10-24: qty 1

## 2017-10-24 MED ORDER — ETOMIDATE 2 MG/ML IV SOLN
INTRAVENOUS | Status: DC | PRN
  Administered 2017-10-24: 13 mg via INTRAVENOUS

## 2017-10-24 MED ORDER — INSULIN ASPART 100 UNIT/ML IV SOLN
10.0000 [IU] | Freq: Once | INTRAVENOUS | Status: AC
  Administered 2017-10-24: 10 [IU] via INTRAVENOUS
  Filled 2017-10-24: qty 0.1

## 2017-10-24 MED ORDER — MIDAZOLAM HCL 5 MG/5ML IJ SOLN
INTRAMUSCULAR | Status: DC | PRN
  Administered 2017-10-24: .5 mg via INTRAVENOUS
  Administered 2017-10-24: 0.5 mg via INTRAVENOUS
  Administered 2017-10-24: 1 mg via INTRAVENOUS
  Administered 2017-10-24: 0.5 mg via INTRAVENOUS

## 2017-10-24 MED ORDER — MORPHINE SULFATE (PF) 4 MG/ML IV SOLN
4.0000 mg | Freq: Once | INTRAVENOUS | Status: AC
  Administered 2017-10-24: 4 mg via INTRAVENOUS
  Filled 2017-10-24: qty 1

## 2017-10-24 MED ORDER — MAGNESIUM SULFATE 2 GM/50ML IV SOLN
2.0000 g | Freq: Once | INTRAVENOUS | Status: AC
  Administered 2017-10-24: 2 g via INTRAVENOUS

## 2017-10-24 MED ORDER — ACETAMINOPHEN 325 MG PO TABS: 650.0000 mg | ORAL_TABLET | Freq: Four times a day (QID) | ORAL | Status: DC | PRN

## 2017-10-24 MED ORDER — DEXAMETHASONE SODIUM PHOSPHATE 10 MG/ML IJ SOLN
INTRAMUSCULAR | Status: AC
  Filled 2017-10-24: qty 1

## 2017-10-24 MED ORDER — INSULIN ASPART 100 UNIT/ML ~~LOC~~ SOLN
2.0000 [IU] | SUBCUTANEOUS | Status: DC
  Administered 2017-10-29 – 2017-10-30 (×5): 2 [IU] via SUBCUTANEOUS

## 2017-10-24 MED ORDER — VASOPRESSIN 20 UNIT/ML IV SOLN
INTRAVENOUS | Status: AC
  Filled 2017-10-24: qty 1

## 2017-10-24 MED ORDER — FENTANYL 2500MCG IN NS 250ML (10MCG/ML) PREMIX INFUSION
25.0000 ug/h | INTRAVENOUS | Status: DC
  Administered 2017-10-24: 25 ug/h via INTRAVENOUS
  Administered 2017-10-25: 175 ug/h via INTRAVENOUS
  Administered 2017-10-26 – 2017-10-27 (×3): 200 ug/h via INTRAVENOUS
  Administered 2017-10-28: 225 ug/h via INTRAVENOUS
  Filled 2017-10-24 (×6): qty 250

## 2017-10-24 MED ORDER — SUCCINYLCHOLINE CHLORIDE 20 MG/ML IJ SOLN
INTRAMUSCULAR | Status: DC | PRN
  Administered 2017-10-24: 120 mg via INTRAVENOUS

## 2017-10-24 MED ORDER — FENTANYL CITRATE (PF) 100 MCG/2ML IJ SOLN
INTRAMUSCULAR | Status: AC
  Administered 2017-10-24: 50 ug via INTRAVENOUS
  Filled 2017-10-24: qty 2

## 2017-10-24 MED ORDER — PHENYLEPHRINE HCL 10 MG/ML IJ SOLN
INTRAMUSCULAR | Status: DC | PRN
  Administered 2017-10-24: 50 ug/min via INTRAVENOUS

## 2017-10-24 MED ORDER — FENTANYL CITRATE (PF) 100 MCG/2ML IJ SOLN
INTRAMUSCULAR | Status: AC
  Filled 2017-10-24: qty 2

## 2017-10-24 MED ORDER — MAGNESIUM SULFATE 2 GM/50ML IV SOLN
INTRAVENOUS | Status: AC
  Filled 2017-10-24: qty 50

## 2017-10-24 MED ORDER — SODIUM BICARBONATE 8.4 % IV SOLN
INTRAVENOUS | Status: AC
  Administered 2017-10-24: 200 meq via INTRAVENOUS
  Filled 2017-10-24: qty 200

## 2017-10-24 MED ORDER — PHENYLEPHRINE HCL 10 MG/ML IJ SOLN
INTRAMUSCULAR | Status: DC | PRN
  Administered 2017-10-24: 300 ug via INTRAVENOUS

## 2017-10-24 MED ORDER — ROCURONIUM BROMIDE 10 MG/ML (PF) SYRINGE
PREFILLED_SYRINGE | INTRAVENOUS | Status: AC
  Filled 2017-10-24: qty 5

## 2017-10-24 MED ORDER — ALBUMIN HUMAN 25 % IV SOLN
25.0000 g | Freq: Once | INTRAVENOUS | Status: AC
  Administered 2017-10-25: 25 g via INTRAVENOUS
  Filled 2017-10-24: qty 50

## 2017-10-24 MED ORDER — PROPOFOL 1000 MG/100ML IV EMUL
INTRAVENOUS | Status: AC
  Filled 2017-10-24: qty 100

## 2017-10-24 MED ORDER — SUGAMMADEX SODIUM 200 MG/2ML IV SOLN
INTRAVENOUS | Status: AC
  Filled 2017-10-24: qty 2

## 2017-10-24 MED ORDER — FENTANYL CITRATE (PF) 250 MCG/5ML IJ SOLN
INTRAMUSCULAR | Status: AC
  Filled 2017-10-24: qty 5

## 2017-10-24 MED ORDER — SODIUM BICARBONATE 8.4 % IV SOLN
INTRAVENOUS | Status: DC | PRN
  Administered 2017-10-24 (×4): 50 meq via INTRAVENOUS

## 2017-10-24 MED ORDER — PANTOPRAZOLE SODIUM 40 MG IV SOLR: 40.0000 mg | Freq: Every day | INTRAVENOUS | Status: DC

## 2017-10-24 MED ORDER — FENTANYL BOLUS VIA INFUSION
25.0000 ug | INTRAVENOUS | Status: DC | PRN
Start: 2017-10-24 — End: 2017-10-28
  Administered 2017-10-24 – 2017-10-25 (×4): 25 ug via INTRAVENOUS
  Filled 2017-10-24: qty 25

## 2017-10-24 SURGICAL SUPPLY — 74 items
ADH SKN CLS APL DERMABOND .7 (GAUZE/BANDAGES/DRESSINGS)
BLADE CLIPPER SURG (BLADE) IMPLANT
CANISTER SUCT 3000ML PPV (MISCELLANEOUS) ×3 IMPLANT
CHLORAPREP W/TINT 26ML (MISCELLANEOUS) ×3 IMPLANT
COVER SURGICAL LIGHT HANDLE (MISCELLANEOUS) ×3 IMPLANT
DECANTER SPIKE VIAL GLASS SM (MISCELLANEOUS) ×4 IMPLANT
DERMABOND ADVANCED (GAUZE/BANDAGES/DRESSINGS)
DERMABOND ADVANCED .7 DNX12 (GAUZE/BANDAGES/DRESSINGS) ×2 IMPLANT
DRAIN PENROSE 1/2X12 LTX STRL (WOUND CARE) ×1 IMPLANT
DRAPE LAPAROSCOPIC ABDOMINAL (DRAPES) ×3 IMPLANT
DRAPE LAPAROTOMY TRNSV 102X78 (DRAPE) ×3 IMPLANT
DRAPE UTILITY XL STRL (DRAPES) ×6 IMPLANT
DRAPE WARM FLUID 44X44 (DRAPE) ×3 IMPLANT
DRSG OPSITE POSTOP 4X10 (GAUZE/BANDAGES/DRESSINGS) IMPLANT
DRSG OPSITE POSTOP 4X8 (GAUZE/BANDAGES/DRESSINGS) IMPLANT
ELECT BLADE 6.5 EXT (BLADE) ×1 IMPLANT
ELECT CAUTERY BLADE 6.4 (BLADE) ×4 IMPLANT
ELECT REM PT RETURN 9FT ADLT (ELECTROSURGICAL) ×3
ELECT SOLID GEL RDN PRO-PADZ (MISCELLANEOUS) ×3
ELECTRODE REM PT RTRN 9FT ADLT (ELECTROSURGICAL) ×2 IMPLANT
ELECTRODE SOLI GEL RDN PROPADZ (MISCELLANEOUS) IMPLANT
GAUZE SPONGE 4X4 16PLY XRAY LF (GAUZE/BANDAGES/DRESSINGS) ×1 IMPLANT
GLOVE BIO SURGEON STRL SZ 6 (GLOVE) ×6 IMPLANT
GLOVE BIOGEL PI IND STRL 6.5 (GLOVE) ×2 IMPLANT
GLOVE BIOGEL PI INDICATOR 6.5 (GLOVE) ×1
GOWN STRL REUS W/ TWL LRG LVL3 (GOWN DISPOSABLE) ×4 IMPLANT
GOWN STRL REUS W/TWL 2XL LVL3 (GOWN DISPOSABLE) ×3 IMPLANT
GOWN STRL REUS W/TWL LRG LVL3 (GOWN DISPOSABLE) ×6
KIT BASIN OR (CUSTOM PROCEDURE TRAY) ×3 IMPLANT
KIT OSTOMY DRAINABLE 2.75 STR (WOUND CARE) ×1 IMPLANT
KIT ROOM TURNOVER OR (KITS) ×3 IMPLANT
LIGASURE IMPACT 36 18CM CVD LR (INSTRUMENTS) ×1 IMPLANT
NDL HYPO 25GX1X1/2 BEV (NEEDLE) ×2 IMPLANT
NEEDLE HYPO 25GX1X1/2 BEV (NEEDLE) ×3 IMPLANT
NS IRRIG 1000ML POUR BTL (IV SOLUTION) ×7 IMPLANT
PACK GENERAL/GYN (CUSTOM PROCEDURE TRAY) ×3 IMPLANT
PACK SURGICAL SETUP 50X90 (CUSTOM PROCEDURE TRAY) ×3 IMPLANT
PAD ABD 8X10 STRL (GAUZE/BANDAGES/DRESSINGS) ×2 IMPLANT
PAD ARMBOARD 7.5X6 YLW CONV (MISCELLANEOUS) ×3 IMPLANT
PENCIL BUTTON HOLSTER BLD 10FT (ELECTRODE) ×3 IMPLANT
RELOAD PROXIMATE 75MM BLUE (ENDOMECHANICALS) ×3 IMPLANT
RELOAD STAPLE 75 3.8 BLU REG (ENDOMECHANICALS) IMPLANT
SPECIMEN JAR LARGE (MISCELLANEOUS) ×3 IMPLANT
SPONGE INTESTINAL PEANUT (DISPOSABLE) ×2 IMPLANT
SPONGE LAP 18X18 X RAY DECT (DISPOSABLE) ×4 IMPLANT
STAPLER PROXIMATE 75MM BLUE (STAPLE) ×1 IMPLANT
STAPLER VISISTAT 35W (STAPLE) ×3 IMPLANT
SUCTION POOLE TIP (SUCTIONS) ×3 IMPLANT
SUT MNCRL AB 4-0 PS2 18 (SUTURE) ×3 IMPLANT
SUT PDS AB 1 TP1 96 (SUTURE) ×7 IMPLANT
SUT PDS II 0 TP-1 LOOPED 60 (SUTURE) ×4 IMPLANT
SUT PROLENE 2 0 SH 30 (SUTURE) ×2 IMPLANT
SUT SILK 2 0 SH (SUTURE) ×2 IMPLANT
SUT VIC AB 2-0 SH 18 (SUTURE) ×4 IMPLANT
SUT VIC AB 2-0 SH 27 (SUTURE)
SUT VIC AB 2-0 SH 27X BRD (SUTURE) ×2 IMPLANT
SUT VIC AB 3-0 SH 18 (SUTURE) ×2 IMPLANT
SUT VIC AB 3-0 SH 27 (SUTURE)
SUT VIC AB 3-0 SH 27X BRD (SUTURE) ×2 IMPLANT
SUT VIC AB 3-0 SH 27XBRD (SUTURE) ×2 IMPLANT
SUT VIC AB 3-0 SH 8-18 (SUTURE) ×2 IMPLANT
SUT VICRYL 4-0 PS2 18IN ABS (SUTURE) IMPLANT
SUT VICRYL AB 2 0 TIES (SUTURE) ×2 IMPLANT
SUT VICRYL AB 3 0 TIES (SUTURE) ×2 IMPLANT
SYR BULB 3OZ (MISCELLANEOUS) ×3 IMPLANT
SYR CONTROL 10ML LL (SYRINGE) ×2 IMPLANT
TAPE CLOTH SURG 4X10 WHT LF (GAUZE/BANDAGES/DRESSINGS) ×1 IMPLANT
TOWEL OR 17X24 6PK STRL BLUE (TOWEL DISPOSABLE) ×3 IMPLANT
TOWEL OR 17X26 10 PK STRL BLUE (TOWEL DISPOSABLE) ×3 IMPLANT
TRAY FOLEY W/METER SILVER 16FR (SET/KITS/TRAYS/PACK) IMPLANT
TROCAR BLADELESS 5MM (ENDOMECHANICALS) ×1 IMPLANT
TUBE CONNECTING 12X1/4 (SUCTIONS) IMPLANT
TUBING INSUFFLATION (TUBING) ×1 IMPLANT
YANKAUER SUCT BULB TIP NO VENT (SUCTIONS) ×3 IMPLANT

## 2017-10-24 NOTE — Anesthesia Procedure Notes (Signed)
Procedure Name: Intubation Date/Time: 10/24/2017 5:02 PM Performed by: Shirlyn Goltz, CRNA Pre-anesthesia Checklist: Patient identified, Emergency Drugs available, Patient being monitored and Suction available Patient Re-evaluated:Patient Re-evaluated prior to induction Oxygen Delivery Method: Circle system utilized Preoxygenation: Pre-oxygenation with 100% oxygen Induction Type: IV induction, Rapid sequence and Cricoid Pressure applied Laryngoscope Size: Mac and 4 Grade View: Grade I Tube type: Subglottic suction tube Tube size: 7.5 mm Number of attempts: 1 Airway Equipment and Method: Stylet Placement Confirmation: ETT inserted through vocal cords under direct vision,  positive ETCO2 and breath sounds checked- equal and bilateral Secured at: 22 cm Tube secured with: Tape Dental Injury: Teeth and Oropharynx as per pre-operative assessment

## 2017-10-24 NOTE — ED Notes (Signed)
Paged [employer]. Jude rep:  Meredith StaggersLeslie Mabe to [place], RN

## 2017-10-24 NOTE — H&P (Signed)
Stephen Cohen is an 76 y.o. male.   Chief Complaint: incarcerated left inguinal hernia HPI:  Pt is a 76 yo M who presents with 2 days of nausea/vomiting and diarrhea.  In the last 12 hours, he has developed intractable left groin pain and swelling. He has been trying to get the hernia dealt with, but has heart issues and has had a fem fem bypass graft.  His AICD fired off yesterday.  He comes in today with acute renal failure with hyperkalemia.  He has had multiple doses of narcotic that have not given him any pain relief.    Past Medical History:  Diagnosis Date  . Arthritis   . Automatic implantable cardioverter-defibrillator in situ   . CAD in native artery 01/08/2016   Overview:  Cardiac cath 03/17/16: Conclusions Diagnostic Procedure Summary Severe global LV dysfunction. EF 35%, EDP=45m Hg. Native RCA, LAD and Cx occluded. LIMA to LAD patent. SVG to RCA clear. Jump SVG to OM1 and OM2 patent. Has proximal stent, 25% narrowing. Diagnostic Procedure Recommendations Medical Therapy because no interventional therapy is required.  . Cataracts, bilateral   . COPD (chronic obstructive pulmonary disease) ([facility]   . Essential hypertension 01/08/2016  . GERD (gastroesophageal reflux disease)   . Hiatal hernia   . HOH (hard of hearing)   . Hyperlipidemia   . Hypertension   . Ischemic dilated cardiomyopathy ([facility] 06/07/2015  . Myocardial infarction ([place] 1995  . PAF ([place]oxysmal atrial fibrillation) ([place] 01/08/2016  . Peptic ulcer disease   . Peripheral vascular disease ([facility]   . VF (ventricular fibrillation) ([facility]    a. appropriate ICD therapy 12/18    Past Surgical History:  Procedure Laterality Date  . APPENDECTOMY    . CARDIAC DEFIBRILLATOR PLACEMENT  02/01/2013   [employer]  . COLONOSCOPY    . CORONARY ANGIOPLASTY     2 stents in 2014.  .Marland KitchenCORONARY ARTERY BYPASS GRAFT  1995  . FEMORAL-FEMORAL BYPASS GRAFT N/A 12/12/2013   Procedure: BYPASS GRAFT FEMORAL-FEMORAL ARTERY-  RIGHT TO LEFT using  rifampin soaked hemashield graft;  Surgeon: TRosetta Posner MD;  [facility];  Service: Vascular;  Laterality: N/A;  . FEMORAL-FEMORAL BYPASS GRAFT Bilateral 07/03/2014   Procedure: REVISION OF Right FEMORAL to Left FEMORAL ARTERY BYPASS GRAFT with removal of Eroded graft;  Surgeon: TRosetta Posner MD;  [place]  Service: Vascular;  Laterality: Bilateral;  . LEFT HEART CATHETERIZATION WITH CORONARY ANGIOGRAM N/A 11/11/2012   Procedure: LEFT HEART CATHETERIZATION WITH CORONARY ANGIOGRAM;  Surgeon: JLaverda Page MD;  [facility];  Service: Cardiovascular;  Laterality: N/A;  . PERCUTANEOUS CORONARY STENT INTERVENTION (PCI-S)  11/11/2012   Procedure: PERCUTANEOUS CORONARY STENT INTERVENTION (PCI-S);  Surgeon: JLaverda Page MD;  [facility];  Service: Cardiovascular;;  . PR VEIN BYPASS GRAFT,AORTO-FEM-POP  1995  2005   Right to left Fem-Fem with revision   . REMOVAL OF GRAFT Bilateral 12/12/2013   Procedure: REMOVAL OF Femoral- femoral hemashield GRAFT;  Surgeon: TRosetta Posner MD;  [facility];  Service: Vascular;  Laterality: Bilateral;    Family History  Problem Relation Age of Onset  . Heart disease Mother   . Hyperlipidemia Mother   . Heart attack Mother   . Heart disease Father   . Hyperlipidemia Father   . Cancer Sister   . Heart disease Sister   . Hyperlipidemia Sister   . Heart attack Sister   . Peripheral vascular disease Sister  Social History:  reports that he has been smoking cigarettes.  He has a 65.00 pack-year smoking history. he has never used smokeless tobacco. He reports that he does not drink alcohol or use drugs.  Allergies: No Known Allergies   (Not in a hospital admission)  Results for orders placed or performed during the hospital encounter of 10/24/17 (from the past 48 hour(s))  Comprehensive metabolic panel     Status: Abnormal   Collection Time: 10/24/17 11:55 AM  Result Value Ref Range   Sodium 136 135 - 145 mmol/L    Potassium 6.5 (HH) 3.5 - 5.1 mmol/L    Comment: SLIGHT HEMOLYSIS CRITICAL RESULT CALLED TO, READ BACK BY AND VERIFIED WITH: C.[facility] RBN @ 1328 10/24/17 BY C.EDENS    Chloride 99 (L) 101 - 111 mmol/L   CO2 15 (L) 22 - 32 mmol/L   Glucose, Bld 115 (H) 65 - 99 mg/dL   BUN 109 (H) 6 - 20 mg/dL   Creatinine, Ser 4.07 (H) 0.61 - 1.24 mg/dL   Calcium 8.7 (L) 8.9 - 10.3 mg/dL   Total Protein 7.5 6.5 - 8.1 g/dL   Albumin 3.7 3.5 - 5.0 g/dL   AST 58 (H) 15 - 41 U/L   ALT 26 17 - 63 U/L   Alkaline Phosphatase 110 38 - 126 U/L   Total Bilirubin 1.2 0.3 - 1.2 mg/dL   GFR calc non Af Amer 13 (L) >60 mL/min   GFR calc Af Amer 15 (L) >60 mL/min    Comment: (NOTE) The eGFR has been calculated using the CKD EPI equation. This calculation has not been validated in all clinical situations. eGFR's persistently <60 mL/min signify possible Chronic Kidney Disease.    Anion gap 22 (H) 5 - 15  CBC with Differential     Status: Abnormal   Collection Time: 10/24/17 11:55 AM  Result Value Ref Range   WBC 43.6 (H) 4.0 - 10.5 K/uL    Comment: WHITE COUNT CONFIRMED ON SMEAR   RBC 4.03 (L) 4.22 - 5.81 MIL/uL   Hemoglobin 12.5 (L) 13.0 - 17.0 g/dL   HCT 39.2 39.0 - 52.0 %   MCV 97.3 78.0 - 100.0 fL   MCH 31.0 26.0 - 34.0 pg   MCHC 31.9 30.0 - 36.0 g/dL   RDW 15.1 11.5 - 15.5 %   Platelets 286 150 - 400 K/uL    Comment: PLATELET COUNT CONFIRMED BY SMEAR   Neutrophils Relative % 91 %   Lymphocytes Relative 6 %   Monocytes Relative 3 %   Eosinophils Relative 0 %   Basophils Relative 0 %   Neutro Abs 39.7 (H) 1.7 - 7.7 K/uL   Lymphs Abs 2.6 0.7 - 4.0 K/uL   Monocytes Absolute 1.3 (H) 0.1 - 1.0 K/uL   Eosinophils Absolute 0.0 0.0 - 0.7 K/uL   Basophils Absolute 0.0 0.0 - 0.1 K/uL   RBC Morphology POLYCHROMASIA PRESENT    WBC Morphology INCREASED BANDS (>20% BANDS)   Protime-INR     Status: Abnormal   Collection Time: 10/24/17 11:55 AM  Result Value Ref Range   Prothrombin Time 17.6 (H) 11.4 - 15.2  seconds   INR 1.46   I-Stat CG4 Lactic Acid, ED     Status: Abnormal   Collection Time: 10/24/17 12:13 PM  Result Value Ref Range   Lactic Acid, Venous 5.77 (HH) 0.5 - 1.9 mmol/L   Comment NOTIFIED PHYSICIAN   I-Stat Chem 8, ED     Status: Abnormal  Collection Time: 10/24/17 12:25 PM  Result Value Ref Range   Sodium 136 135 - 145 mmol/L   Potassium 6.3 (HH) 3.5 - 5.1 mmol/L   Chloride 104 101 - 111 mmol/L   BUN 104 (H) 6 - 20 mg/dL   Creatinine, Ser 3.70 (H) 0.61 - 1.24 mg/dL   Glucose, Bld 120 (H) 65 - 99 mg/dL   Calcium, Ion 1.00 (L) 1.15 - 1.40 mmol/L   TCO2 18 (L) 22 - 32 mmol/L   Hemoglobin 15.3 13.0 - 17.0 g/dL   HCT 45.0 39.0 - 52.0 %   Comment NOTIFIED PHYSICIAN   I-Stat CG4 Lactic Acid, ED     Status: Abnormal   Collection Time: 10/24/17  2:34 PM  Result Value Ref Range   Lactic Acid, Venous 3.68 (HH) 0.5 - 1.9 mmol/L   Comment NOTIFIED PHYSICIAN    Ct Abdomen Pelvis Wo Contrast  Result Date: 10/24/2017 CLINICAL DATA:  Worsening right lower quadrant abdominal pain. Nausea vomiting and diarrhea. EXAM: CT ABDOMEN AND PELVIS WITHOUT CONTRAST TECHNIQUE: Multidetector CT imaging of the abdomen and pelvis was performed following the standard protocol without IV contrast. COMPARISON:  09/24/2017 FINDINGS: Lower chest: Marked cylindrical bronchiectasis. Chronic interstitial lung changes with honeycombing in the lower lobes. Small hiatal hernia. Hepatobiliary: No focal liver abnormality is seen. No gallstones, gallbladder wall thickening, or biliary dilatation. Pancreas: Unremarkable. No pancreatic ductal dilatation or surrounding inflammatory changes. Spleen: Normal in size without focal abnormality. Adrenals/Urinary Tract: Adrenal glands are unremarkable. Kidneys are normal, without renal calculi, focal lesion, or hydronephrosis. Bladder is unremarkable. Stomach/Bowel: Normal stomach. No evidence of small-bowel obstruction. Post appendectomy. Left inguinal hernia contains loops of  descending colon with mucosal thickening of the bowel within the hernial sac. The colon distal to the hernia is decompressed, while the colon proximal to the hernia is mildly prominent and filled with semiliquid stool. Vascular/Lymphatic: Aortic atherosclerosis. No enlarged abdominal or pelvic lymph nodes. Reproductive: Prostate is unremarkable. Other: By femoral graft present. Right fat containing inguinal hernia. No abdominopelvic ascites. Musculoskeletal: No suspicious osseous findings. IMPRESSION: Narrow neck left inguinal hernia containing short segment of descending colon. Moderate circumferential mucosal thickening of the bowel loop within the hernial sac is suggestive of possible incarceration. No evidence of high degree obstruction, however there is a caliber change of the colon at the hernia with mild dilation of the colon upstream to the hernia, which may represent early or incomplete obstruction. The small bowel is normal. Chronic interstitial lung changes in the lung bases. Heavy aortic atherosclerosis. These results were called by telephone at the time of interpretation on 10/24/2017 at 1:13 pm to Dr. Threasa Beards BELFI , who verbally acknowledged these results. Electronically Signed   By: Fidela Salisbury M.D.   On: 10/24/2017 13:18   Dg Chest 2 View  Result Date: 10/22/2017 CLINICAL DATA:  Syncopal episode earlier tonight, patient unresponsive for several seconds. Indwelling pacing defibrillator due to ischemic cardiomyopathy. Patient states that the defibrillator fired this evening. EXAM: CHEST  2 VIEW COMPARISON:  10/02/2017, 09/24/2017 and earlier, including CTA chest abdomen and pelvis 09/24/2017 and CTA chest 03/12/2016. FINDINGS: Prior sternotomy for CABG. Cardiac silhouette moderately enlarged for the AP technique, unchanged. Thoracic aorta atherosclerotic, unchanged. Left subclavian pacing defibrillator unchanged and appears intact. Extensive interstitial pulmonary fibrosis throughout both  lungs and emphysematous changes in the upper lobes, unchanged. No new pulmonary parenchymal abnormalities. No pleural effusions. Visualized bony thorax intact IMPRESSION: 1.  No acute cardiopulmonary disease. 2. Stable moderate cardiomegaly without evidence of  pulmonary edema. 3. Stable chronic interstitial pulmonary fibrosis. 4. Aortic Atherosclerosis (ICD10-I70.0) and Emphysema (ICD10-J43.9). Electronically Signed   By: Evangeline Dakin M.D.   On: 10/22/2017 20:58    Review of Systems  Constitutional: Positive for chills.  HENT: Negative.   Eyes: Negative.   Respiratory: Negative.   Cardiovascular:       Recent AICD fire.    Gastrointestinal: Positive for abdominal pain, diarrhea, nausea and vomiting.  Genitourinary:       Left groin hernia  Musculoskeletal: Negative.   Skin: Negative.   Neurological: Negative.   Endo/Heme/Allergies: Negative.   Psychiatric/Behavioral: Negative.     Blood pressure 105/70, pulse 99, temperature (!) 97.5 F (36.4 C), temperature source Axillary, resp. rate (!) 22, height _0  (1.803 m), weight 64 kg (141 lb), SpO2 100 %. Physical Exam  Constitutional: He is oriented to person, place, and time. He appears well-developed and well-nourished. He appears distressed.  HENT:  Head: Normocephalic and atraumatic.  Right Ear: External ear normal.  Left Ear: External ear normal.  Mouth/Throat: Oropharynx is clear and moist.  Eyes: Conjunctivae are normal. Pupils are equal, round, and reactive to light. No scleral icterus.  Neck: Normal range of motion. JVD present. No tracheal deviation present. No thyromegaly present.  Cardiovascular: Normal rate, regular rhythm and intact distal pulses.  Respiratory: Effort normal. No respiratory distress.  GI: Soft. He exhibits no distension. There is tenderness (left abdominal tenderness). There is no rebound and no guarding.  Actively vomiting.  Musculoskeletal: Normal range of motion. He exhibits no edema or  tenderness.  Neurological: He is alert and oriented to person, place, and time. Coordination normal.  Skin: He is diaphoretic.  Psychiatric: He has a normal mood and affect. His behavior is normal. Judgment and thought content normal.     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   This is complicated by his overall status. Will need ICU post op Have discussed with ICU attg on call. He is on sepsis protocol.  Hypovolemia - has gotten 2.5 L fluid already.  Will need to be extra careful regarding the fem-fem graft.   Discussed risks with patient and family. Given level of sepsis, I am concerned about dead bowel. Reviewed possibility of exploratory laparotomy and bowel resection.   I discussed that he will likely go to the ICU intubated.  Stark Klein, MD 10/24/2017, 2:47 PM

## 2017-10-24 NOTE — Anesthesia Postprocedure Evaluation (Signed)
Anesthesia Post Note  Patient: Orvilla FusBilly R Crispen  Procedure(s) Performed: HERNIA REPAIR INGUINAL ADULT (N/A Abdomen) EXPLORATORY LAPAROTOMY (N/A Abdomen) SMALL BOWEL RESECTION (N/A Abdomen) LAPAROSCOPY DIAGNOSTIC (N/A Abdomen) COLOSTOMY (Abdomen)     Patient location during evaluation: ICU Anesthesia Type: General Level of consciousness: patient remains intubated per anesthesia plan Pain management: pain level controlled Vital Signs Assessment: post-procedure vital signs reviewed and stable Respiratory status: patient remains intubated per anesthesia plan Anesthetic complications: no    Last Vitals:  Vitals:   10/24/17 1945 10/24/17 2000  BP:    Pulse: 73 72  Resp: 18 20  Temp:    SpO2: 97% 98%    Last Pain:  Vitals:   10/24/17 1428  TempSrc:   PainSc: 10-Worst pain ever                 Hazelene Doten

## 2017-10-24 NOTE — ED Provider Notes (Signed)
[facility] EMERGENCY DEPARTMENT Provider Note   CSN: [id number] Arrival date & time: 10/24/17  1144     History   Chief Complaint Chief Complaint  Patient presents with  . Abdominal Pain    HPI Stephen Cohen is a 76 y.o. male.  Patient is a 76 year old male with a history of status post bypass surgery, A. fib on Eliquis, hypertension, hyperlipidemia and prior V. fib with ICD placement.  He is also status post femorofemoral bypass and is followed by Dr. early.  He has no ventral hernias across his lower abdomen from prior surgeries.  He states he always has a slight bulge in his lower abdomen but since yesterday his bulge is been bigger and he has had increased abdominal pain that started last night and has worsened throughout the night.  He has had multiple episodes of vomiting.  No fevers.  No urinary symptoms.  EMS reports that he was hypotensive with pressures in the 70s.  He received 1 L of normal saline in route.  He is oxygen dependent and normally is on 3 L/min.  He is currently requiring 6 L/min to maintain sats above 92.      Past Medical History:  Diagnosis Date  . Arthritis   . Automatic implantable cardioverter-defibrillator in situ   . CAD in native artery 01/08/2016   Overview:  Cardiac cath 03/17/16: Conclusions Diagnostic Procedure Summary Severe global LV dysfunction. EF 35%, EDP=62mm Hg. Native RCA, LAD and Cx occluded. LIMA to LAD patent. SVG to RCA clear. Jump SVG to OM1 and OM2 patent. Has proximal stent, 25% narrowing. Diagnostic Procedure Recommendations Medical Therapy because no interventional therapy is required.  . Cataracts, bilateral   . COPD (chronic obstructive pulmonary disease) (HCC)   . Essential hypertension 01/08/2016  . GERD (gastroesophageal reflux disease)   . Hiatal hernia   . HOH (hard of hearing)   . Hyperlipidemia   . Hypertension   . Ischemic dilated cardiomyopathy (HCC) 06/07/2015  . Myocardial infarction (HCC) 1995    . PAF (paroxysmal atrial fibrillation) (HCC) 01/08/2016  . Peptic ulcer disease   . Peripheral vascular disease (HCC)   . VF (ventricular fibrillation) (HCC)    a. appropriate ICD therapy 12/18    Patient Active Problem List   Diagnosis Date Noted  . Shortness of breath   . Peripheral vascular disease (HCC)   . Pacemaker   . Hyperlipidemia   . Coronary artery disease   . COPD (chronic obstructive pulmonary disease) (HCC)   . Automatic implantable cardioverter-defibrillator in situ   . History of bilateral inguinal hernias 07/09/2017  . H/O heart artery stent 06/24/2017  . Dyslipidemia 06/24/2017  . Hx of CABG 06/24/2017  . Chest pain 03/15/2016  . CAD in native artery 01/08/2016  . Chronic anticoagulation 01/08/2016  . Essential hypertension 01/08/2016  . PAF (paroxysmal atrial fibrillation) (HCC) 01/08/2016  . Pulmonary emphysema (HCC) 01/08/2016  . AICD present, double chamber 06/07/2015  . Ischemic dilated cardiomyopathy (HCC) 06/07/2015  . Encounter for post surgical wound check 07/18/2014  . Erosion of graft 07/03/2014  . Visit for wound check 06/20/2014  . Peripheral vascular disease, unspecified (HCC) 02/14/2014  . Post op infection 12/21/2013  . PAD (peripheral artery disease) (HCC) 12/12/2013  . Dizziness and giddiness 08/30/2013  . Swelling of limb-Bilateral leg 08/30/2013  . Pain in limb-Left leg 08/30/2013  . Nonpalpable pulse-Left groin area 08/30/2013  . Unstable angina (HCC) 11/11/2012  . Systolic and diastolic CHF, chronic (HCC)  11/11/2012  . S/P PTCA (percutaneous transluminal coronary angioplasty) 11/11/2012  . Atherosclerosis of native arteries of the extremities with intermittent claudication 08/20/2012  . Aftercare following surgery of the circulatory system, NEC 08/20/2012  . Myocardial infarction [facility]) 11/10/1993    Past Surgical History:  Procedure Laterality Date  . APPENDECTOMY    . CARDIAC DEFIBRILLATOR PLACEMENT  02/01/2013   [employer]  .  COLONOSCOPY    . CORONARY ANGIOPLASTY     2 stents in 2014.  Marland Kitchen CORONARY ARTERY BYPASS GRAFT  1995  . FEMORAL-FEMORAL BYPASS GRAFT N/A 12/12/2013   Procedure: BYPASS GRAFT FEMORAL-FEMORAL ARTERY-  RIGHT TO LEFT using rifampin soaked hemashield graft;  Surgeon: Larina Earthly, MD;  Location: [facility] OR;  Service: Vascular;  Laterality: N/A;  . FEMORAL-FEMORAL BYPASS GRAFT Bilateral 07/03/2014   Procedure: REVISION OF Right FEMORAL to Left FEMORAL ARTERY BYPASS GRAFT with removal of Eroded graft;  Surgeon: Larina Earthly, MD;  Location: [facility] OR;  Service: Vascular;  Laterality: Bilateral;  . LEFT HEART CATHETERIZATION WITH CORONARY ANGIOGRAM N/A 11/11/2012   Procedure: LEFT HEART CATHETERIZATION WITH CORONARY ANGIOGRAM;  Surgeon: Pamella Pert, MD;  Location: [facility] CATH LAB;  Service: Cardiovascular;  Laterality: N/A;  . PERCUTANEOUS CORONARY STENT INTERVENTION (PCI-S)  11/11/2012   Procedure: PERCUTANEOUS CORONARY STENT INTERVENTION (PCI-S);  Surgeon: Pamella Pert, MD;  Location: [facility] CATH LAB;  Service: Cardiovascular;;  . PR VEIN BYPASS GRAFT,AORTO-FEM-POP  1995  2005   Right to left Fem-Fem with revision   . REMOVAL OF GRAFT Bilateral 12/12/2013   Procedure: REMOVAL OF Femoral- femoral hemashield GRAFT;  Surgeon: Larina Earthly, MD;  Location: [facility] OR;  Service: Vascular;  Laterality: Bilateral;       Home Medications    Prior to Admission medications   Medication Sig Start Date End Date Taking? Authorizing Provider  acetaminophen (TYLENOL) 500 MG tablet Take 2,500 mg by mouth daily.   Yes [provider]  albuterol (PROAIR HFA) 108 (90 Base) MCG/ACT inhaler Inhale into the lungs every 6 (six) hours as needed for wheezing or shortness of breath.   Yes [provider]  apixaban (ELIQUIS) 5 MG TABS tablet Take 5 mg by mouth 2 (two) times daily. 07/08/17 07/08/18 Yes [provider]  aspirin EC 81 MG tablet Take 81 mg by mouth daily.   Yes [provider]  atorvastatin  (LIPITOR) 10 MG tablet Take 10 mg by mouth at bedtime.    Yes [provider]  carvedilol (COREG) 3.125 MG tablet Take 1 tablet (3.125 mg total) by mouth 2 (two) times daily with a meal. 10/22/17  Yes Baldo Daub, MD  diphenhydrAMINE (BENADRYL) 25 mg capsule Take 50 mg by mouth 4 (four) times daily.    Yes [provider]  esomeprazole (NEXIUM) 20 MG capsule Take 20 mg by mouth daily.    Yes [provider]  Homeopathic Products (LEG CRAMP RELIEF) TABS Take 1 tablet by mouth 2 (two) times daily.   Yes [provider]  lisinopril (PRINIVIL,ZESTRIL) 5 MG tablet Take 1 tablet (5 mg total) by mouth daily. 10/22/17  Yes Baldo Daub, MD  loperamide (LOPERAMIDE A-D) 2 MG tablet Take 2 mg by mouth 4 (four) times daily as needed for diarrhea or loose stools.   Yes [provider]  Multiple Vitamins-Minerals (OCUVITE ADULT 50+ PO) Take 2 tablets by mouth daily.    Yes [provider]  POTASSIUM GLUCONATE PO Take 198 mg by mouth daily.  Yes [provider]  spironolactone (ALDACTONE) 25 MG tablet Take 1 tablet (25 mg total) by mouth daily. 10/22/17  Yes Baldo Daub, MD  torsemide (DEMADEX) 20 MG tablet Take 1 tablet (20 mg total) by mouth 2 (two) times daily. Take an extra tablet 20 mg mid day every other day starting today Patient taking differently: Take 20 mg by mouth See admin instructions. Take Twice daily then take an extra 20 mg mid day every other day 10/22/17  Yes Baldo Daub, MD  umeclidinium-vilanterol (ANORO ELLIPTA) 62.5-25 MCG/INH AEPB Inhale 1 puff into the lungs daily.   Yes [provider]  amiodarone (PACERONE) 200 MG tablet Take 2 tablets (400 mg total) TWICE a day for 2 weeks, then take 2 tablets (400 mg total) ONCE a day for 2 weeks. 10/23/17   Camnitz, Andree Coss, MD  amiodarone (PACERONE) 200 MG tablet Take 1 tablet (200 mg total) by mouth daily. 10/23/17   Camnitz, Andree Coss, MD  isosorbide  mononitrate (IMDUR) 60 MG 24 hr tablet Take 1 tablet (60 mg total) by mouth daily. 10/23/17   Camnitz, Andree Coss, MD  nitroGLYCERIN (NITROSTAT) 0.4 MG SL tablet Place 1 tablet (0.4 mg total) under the tongue every 5 (five) minutes as needed. For chest pain 10/21/17   Baldo Daub, MD    Family History Family History  Problem Relation Age of Onset  . Heart disease Mother   . Hyperlipidemia Mother   . Heart attack Mother   . Heart disease Father   . Hyperlipidemia Father   . Cancer Sister   . Heart disease Sister   . Hyperlipidemia Sister   . Heart attack Sister   . Peripheral vascular disease Sister     Social History Social History   Tobacco Use  . Smoking status: Current Every Day Smoker    Packs/day: 1.00    Years: 65.00    Pack years: 65.00    Types: Cigarettes  . Smokeless tobacco: Never Used  . Tobacco comment: 3/4 pk per day  Substance Use Topics  . Alcohol use: No  . Drug use: No     Allergies   Patient has no known allergies.   Review of Systems Review of Systems  Constitutional: Negative for chills, diaphoresis, fatigue and fever.  HENT: Negative for congestion, rhinorrhea and sneezing.   Eyes: Negative.   Respiratory: Negative for cough, chest tightness and shortness of breath.   Cardiovascular: Negative for chest pain and leg swelling.  Gastrointestinal: Positive for abdominal pain, nausea and vomiting. Negative for blood in stool and diarrhea.  Genitourinary: Negative for difficulty urinating, flank pain, frequency and hematuria.  Musculoskeletal: Negative for arthralgias and back pain.  Skin: Negative for rash.  Neurological: Negative for dizziness, speech difficulty, weakness, numbness and headaches.     Physical Exam Updated Vital Signs BP (!) 86/52   Pulse 88   Temp (!) 97.5 F (36.4 C) (Axillary)   Resp 15   Ht 5\' 11"  (1.803 m)   Wt 64 kg (141 lb)   SpO2 99%   BMI 19.67 kg/m   Physical Exam  Constitutional: He is oriented to  person, place, and time. He appears well-developed and well-nourished. He appears distressed.  Patient appears very uncomfortable and is writhing around in the bed  HENT:  Head: Normocephalic and atraumatic.  Eyes: Pupils are equal, round, and reactive to light.  Neck: Normal range of motion. Neck supple.  Cardiovascular: Normal rate, regular rhythm and normal heart  sounds.  Pulmonary/Chest: Effort normal and breath sounds normal. No respiratory distress. He has no wheezes. He has no rales. He exhibits no tenderness.  Abdominal: Soft. Bowel sounds are normal. There is tenderness. There is guarding. There is no rebound.  Patient has guarding with diffuse lower abdominal tenderness.  He has a palpable lower abdominal hernia which is not able to be reduced.  He has palpable grafts in his femoral artery area.  Musculoskeletal: Normal range of motion. He exhibits no edema.  Lymphadenopathy:    He has no cervical adenopathy.  Neurological: He is alert and oriented to person, place, and time.  Skin: Skin is warm and dry. No rash noted.  Psychiatric: He has a normal mood and affect.     ED Treatments / Results  Labs (all labs ordered are listed, but only abnormal results are displayed) Labs Reviewed  COMPREHENSIVE METABOLIC PANEL - Abnormal; Notable for the following components:      Result Value   Potassium 6.5 (*)    Chloride 99 (*)    CO2 15 (*)    Glucose, Bld 115 (*)    BUN 109 (*)    Creatinine, Ser 4.07 (*)    Calcium 8.7 (*)    AST 58 (*)    GFR calc non Af Amer 13 (*)    GFR calc Af Amer 15 (*)    Anion gap 22 (*)    All other components within normal limits  CBC WITH DIFFERENTIAL/PLATELET - Abnormal; Notable for the following components:   WBC 43.6 (*)    RBC 4.03 (*)    Hemoglobin 12.5 (*)    Neutro Abs 39.7 (*)    Monocytes Absolute 1.3 (*)    All other components within normal limits  PROTIME-INR - Abnormal; Notable for the following components:   Prothrombin Time  17.6 (*)    All other components within normal limits  I-STAT CG4 LACTIC ACID, ED - Abnormal; Notable for the following components:   Lactic Acid, Venous 5.77 (*)    All other components within normal limits  I-STAT CHEM 8, ED - Abnormal; Notable for the following components:   Potassium 6.3 (*)    BUN 104 (*)    Creatinine, Ser 3.70 (*)    Glucose, Bld 120 (*)    Calcium, Ion 1.00 (*)    TCO2 18 (*)    All other components within normal limits  I-STAT CG4 LACTIC ACID, ED - Abnormal; Notable for the following components:   Lactic Acid, Venous 3.68 (*)    All other components within normal limits  CULTURE, BLOOD (ROUTINE X 2)  CULTURE, BLOOD (ROUTINE X 2)  URINALYSIS, ROUTINE W REFLEX MICROSCOPIC  TYPE AND SCREEN  PREPARE RBC (CROSSMATCH)    EKG  EKG Interpretation None       Radiology Ct Abdomen Pelvis Wo Contrast  Result Date: 10/24/2017 CLINICAL DATA:  Worsening right lower quadrant abdominal pain. Nausea vomiting and diarrhea. EXAM: CT ABDOMEN AND PELVIS WITHOUT CONTRAST TECHNIQUE: Multidetector CT imaging of the abdomen and pelvis was performed following the standard protocol without IV contrast. COMPARISON:  09/24/2017 FINDINGS: Lower chest: Marked cylindrical bronchiectasis. Chronic interstitial lung changes with honeycombing in the lower lobes. Small hiatal hernia. Hepatobiliary: No focal liver abnormality is seen. No gallstones, gallbladder wall thickening, or biliary dilatation. Pancreas: Unremarkable. No pancreatic ductal dilatation or surrounding inflammatory changes. Spleen: Normal in size without focal abnormality. Adrenals/Urinary Tract: Adrenal glands are unremarkable. Kidneys are normal, without renal calculi, focal lesion,  or hydronephrosis. Bladder is unremarkable. Stomach/Bowel: Normal stomach. No evidence of small-bowel obstruction. Post appendectomy. Left inguinal hernia contains loops of descending colon with mucosal thickening of the bowel within the hernial  sac. The colon distal to the hernia is decompressed, while the colon proximal to the hernia is mildly prominent and filled with semiliquid stool. Vascular/Lymphatic: Aortic atherosclerosis. No enlarged abdominal or pelvic lymph nodes. Reproductive: Prostate is unremarkable. Other: By femoral graft present. Right fat containing inguinal hernia. No abdominopelvic ascites. Musculoskeletal: No suspicious osseous findings. IMPRESSION: Narrow neck left inguinal hernia containing short segment of descending colon. Moderate circumferential mucosal thickening of the bowel loop within the hernial sac is suggestive of possible incarceration. No evidence of high degree obstruction, however there is a caliber change of the colon at the hernia with mild dilation of the colon upstream to the hernia, which may represent early or incomplete obstruction. The small bowel is normal. Chronic interstitial lung changes in the lung bases. Heavy aortic atherosclerosis. These results were called by telephone at the time of interpretation on 10/24/2017 at 1:13 pm to Dr. Shawna OrleansMELANIE Luvina Poirier , who verbally acknowledged these results. Electronically Signed   By: Ted Mcalpineobrinka  Dimitrova M.D.   On: 10/24/2017 13:18   Dg Chest 2 View  Result Date: 10/22/2017 CLINICAL DATA:  Syncopal episode earlier tonight, patient unresponsive for several seconds. Indwelling pacing defibrillator due to ischemic cardiomyopathy. Patient states that the defibrillator fired this evening. EXAM: CHEST  2 VIEW COMPARISON:  10/02/2017, 09/24/2017 and earlier, including CTA chest abdomen and pelvis 09/24/2017 and CTA chest 03/12/2016. FINDINGS: Prior sternotomy for CABG. Cardiac silhouette moderately enlarged for the AP technique, unchanged. Thoracic aorta atherosclerotic, unchanged. Left subclavian pacing defibrillator unchanged and appears intact. Extensive interstitial pulmonary fibrosis throughout both lungs and emphysematous changes in the upper lobes, unchanged. No new  pulmonary parenchymal abnormalities. No pleural effusions. Visualized bony thorax intact IMPRESSION: 1.  No acute cardiopulmonary disease. 2. Stable moderate cardiomegaly without evidence of pulmonary edema. 3. Stable chronic interstitial pulmonary fibrosis. 4. Aortic Atherosclerosis (ICD10-I70.0) and Emphysema (ICD10-J43.9). Electronically Signed   By: Hulan Saashomas  Lawrence M.D.   On: 10/22/2017 20:58    Procedures Procedures (including critical care time)  Medications Ordered in ED Medications  fentaNYL (SUBLIMAZE) 100 MCG/2ML injection (not administered)  dextrose 50 % solution 50 mL (not administered)  insulin aspart (novoLOG) injection 10 Units (not administered)  fentaNYL (SUBLIMAZE) injection 50 mcg (50 mcg Intravenous Given 10/24/17 1215)  fentaNYL (SUBLIMAZE) injection 50 mcg (50 mcg Intravenous Given 10/24/17 1219)  sodium chloride 0.9 % bolus 1,000 mL (0 mLs Intravenous Stopped 10/24/17 1300)    And  sodium chloride 0.9 % bolus 1,000 mL (0 mLs Intravenous Stopped 10/24/17 1335)  piperacillin-tazobactam (ZOSYN) IVPB 3.375 g (0 g Intravenous Stopped 10/24/17 1341)  morphine 4 MG/ML injection 4 mg (4 mg Intravenous Given 10/24/17 1408)  promethazine (PHENERGAN) injection 12.5 mg (12.5 mg Intravenous Given 10/24/17 1420)     Initial Impression / Assessment and Plan / ED Course  I have reviewed the triage vital signs and the nursing notes.  Pertinent labs & imaging results that were available during my care of the patient were reviewed by me and considered in my medical decision making (see chart for details).  Clinical Course as of Oct 25 1503  Sat Oct 24, 2017  1408 Pt with incarcerated hernia, lactate 5, started on sepsis protocol.  Dr. Donell BeersByerly notified but is in with a level 1 trauma currently  [MB]    Clinical Course User  Index [MB] Rolan BuccoBelfi, Lenon Kuennen, MD    Patient is a 76 year old male who presents with worsening abdominal pain since last night.  He has a known history of  ventral hernias.  He currently has a tender nonreducible hernia.  CT scan shows incarcerated hernia.  He has a markedly elevated white count and elevated lactate.  He was treated for possible sepsis with IV fluids and antibiotics.  I spoke with Dr. Donell BeersByerly who will take the patient to the operating room.  CRITICAL CARE Performed by: Rolan BuccoMelanie Aybree Lanyon Total critical care time: 30 minutes Critical care time was exclusive of separately billable procedures and treating other patients. Critical care was necessary to treat or prevent imminent or life-threatening deterioration. Critical care was time spent personally by me on the following activities: development of treatment plan with patient and/or surrogate as well as nursing, discussions with consultants, evaluation of patient's response to treatment, examination of patient, obtaining history from patient or surrogate, ordering and performing treatments and interventions, ordering and review of laboratory studies, ordering and review of radiographic studies, pulse oximetry and re-evaluation of patient's condition.   Final Clinical Impressions(s) / ED Diagnoses   Final diagnoses:  Incarcerated hernia    ED Discharge Orders    None       Rolan BuccoBelfi, Bailea Beed, MD 10/24/17 1505

## 2017-10-24 NOTE — Op Note (Signed)
PRE-OPERATIVE DIAGNOSIS: incarcerated strangulated left inguinal hernia  POST-OPERATIVE DIAGNOSIS:  Same with ischemic sigmoid colon  PROCEDURE:  Procedure(s): Primary repair incarcerated inguinal hernia, diagnostic laparoscopy, exploratory laparotomy with sigmoid colectomy and end colostomy  SURGEON:  Surgeon(s): Almond LintFaera Li Bobo, MD  ASSISTANT: Royston SinnerPJ Toth, MD  ANESTHESIA:   general  DRAINS: none   LOCAL MEDICATIONS USED:  NONE  SPECIMEN:  Source of Specimen:  sigmoid colon to pathology  DISPOSITION OF SPECIMEN:  PATHOLOGY  COUNTS:  YES  DICTATION: .Dragon Dictation  PLAN OF CARE: Admit to inpatient   PATIENT DISPOSITION:  ICU - intubated and critically ill.  FINDINGS:  Indirect inguinal hernia with small opening.  Murky fluid and foul smell, but no bowel in hernia. Dx laparoscopy showed ischemic sigmoid colon.      EBL: 100  PROCEDURE:   Patient was identified in the holding area and was taken to the operating room where he was placed supine on the operating table.  General endotracheal anesthesia was induced.  A Foley catheter was placed.  The abdomen was prepped and draped in sterile fashion.  A timeout was performed according to the surgical safety checklist.  When all was correct, we continued.  A obliquely oriented inguinal incision was made in the left groin taking care to be medial and inferior to the femorofemoral bypass graft.  The subcutaneous tissues were divided with the cautery.  The external oblique was divided with the cautery.  The spermatic cord and the hernia sac were seen.  The hernia sac was dissected away from the spermatic cord.  As the incision was being made and the dissection was being performed the size of the hernia decreased as the incarcerated contents reduced back into the abdomen.  The spermatic cord was held out of the way with a Penrose drain.  The hernia sac was opened and there was no evidence of bowel in the hernia sac.  There was a small amount  of ischemic omentum present in the sac.    Because of the foul smell and the patient's physiologic parameters, a 5 mm Optiview port was placed in the left upper quadrant.  Upon insufflating and inspection, it was immediately apparent that the sigmoid colon was ischemic.  The midline was incised and the subcutaneous tissues were divided with a  Bovie electrocautery.  The incision was much higher than would be preferable in order to avoid incising the femorofemoral bypass graft.  The fascia was opened in the midline.  Murky fluid was aspirated from the abdomen.  The white line of Toldt of the descending colon was taken down with the Bovie electrocautery. This dissection  was carried out proximally along the descending colon. The GIA stapler was used to fire across the mid descending colon proximal to the ischemia, and the Ligasure was used to divide the mesocolon high up near the colon wall. The distal sigmoid was divided with the GIA 75 distal to the ischemic sigmoid.  The abdomen was then irrigated copiously.  Attention was directed to the left abdomen for a location for an  ostomy. Kochers were used to pull the fascia to the midline and an  another Zannie CoveKocher was used to elevate the skin to create a circular site.  A divot of fatty tissue was taken as well. The fascia was opened in a  cruciate fashion separating the longitudinal fibers of the rectus. A  lap was placed behind the fascia to ensure that the Bovie could not go  through and puncture any  of the intra-abdominal organs. A Tanja PortBabcock was  then advanced through the abdominal wall and used to retract the stump  of the descending colon through the abdominal wall.   The hernia was closed primarily by approximating the inferior edge of the internal oblique to the inferior shelving edge with 2-0 prolene horizontal mattress sutures x 2.  There was a small space between this and the spermatic cord.  The external oblique and the scarpa's fascia was  closed with running 2-0 vicryl.  The skin pf the hernia repair was closed with staples.    The abdomen was then closed using running #1 looped PDS sutures. The wound  was then irrigated and packed with moist Kerlix. The skin was irrigated and dressed with Kerlix The colon was then opened with curved Mayo, and Bovie was used to control bleeding.  3-0 Vicryl sutures were used to create the ostomy. This was then dressed with a 2 piece wafer.

## 2017-10-24 NOTE — Anesthesia Preprocedure Evaluation (Addendum)
Anesthesia Evaluation  Patient identified by MRN, date of birth, ID band Patient awake    Airway Mallampati: II  TM Distance: >3 FB     Dental   Pulmonary shortness of breath, COPD, Current Smoker,    breath sounds clear to auscultation       Cardiovascular hypertension, + angina + CAD, + Past MI, + Peripheral Vascular Disease and +CHF  + pacemaker + Cardiac Defibrillator  Rhythm:Irregular Rate:Normal  History noted. CG   Neuro/Psych    GI/Hepatic Neg liver ROS, hiatal hernia, PUD, GERD  ,  Endo/Other  negative endocrine ROS  Renal/GU negative Renal ROS     Musculoskeletal  (+) Arthritis ,   Abdominal   Peds  Hematology   Anesthesia Other Findings   Reproductive/Obstetrics                            Anesthesia Physical Anesthesia Plan  ASA: IV  Anesthesia Plan: General   Post-op Pain Management:    Induction: Intravenous  PONV Risk Score and Plan: Treatment may vary due to age or medical condition  Airway Management Planned: Oral ETT  Additional Equipment:   Intra-op Plan:   Post-operative Plan: Possible Post-op intubation/ventilation  Informed Consent:   Dental advisory given  Plan Discussed with: Surgeon, Anesthesiologist and CRNA  Anesthesia Plan Comments:        Anesthesia Quick Evaluation

## 2017-10-24 NOTE — Anesthesia Procedure Notes (Signed)
Arterial Line Insertion Start/End12/15/2018 4:30 PM, 10/24/2017 4:35 PM Performed by: Dairl PonderJiang, Fudan, CRNA  Patient location: Pre-op. Emergency situation radial was placed Catheter size: 20 G  Attempts: 1 Procedure performed without using ultrasound guided technique. Following insertion, Biopatch and dressing applied. Post procedure assessment: normal

## 2017-10-24 NOTE — ED Triage Notes (Signed)
Pt arrives from  Home via [facility] c/o abdominal pain, n/v/d starting last night.Pt reports pain is in L inguinal hernia.  Pt appears SOB, states, "That's from the pain." EMS reports pt initially 76% on 2L, fire bumped p[t to 4L, satas improved to 80%, then moved to NRB, sats 92%.  EMS rpeorts pt's family states pt always hypotensive, hypoxic.  Pt AOx4 on arrival,

## 2017-10-24 NOTE — ED Notes (Signed)
CBG was 71, RN was notified

## 2017-10-24 NOTE — Progress Notes (Signed)
Watch, glasses, Upper Denture plate given to Buffy, OR Nursing Secretary. These items were placed in a labeled bag with denture cup having a separate label on it.

## 2017-10-24 NOTE — Progress Notes (Signed)
ANTIBIOTIC CONSULT NOTE - INITIAL  Pharmacy Consult for Zosyn Indication: Intraabdominal infection  No Known Allergies  Patient Measurements:   Adjusted Body Weight:    Vital Signs: Temp: 97.5 F (36.4 C) (12/15 1159) Temp Source: Axillary (12/15 1159) BP: 131/107 (12/15 1239) Pulse Rate: 109 (12/15 1240) Intake/Output from previous day: No intake/output data recorded. Intake/Output from this shift: Total I/O In: 500 [I.V.:500] Out: -   Labs: Recent Labs    10/22/17 1725 10/24/17 1155 10/24/17 1225  WBC 12.2* PENDING  --   HGB 11.1* 12.5* 15.3  PLT 309 PENDING  --   CREATININE 1.88*  --  3.70*   Estimated Creatinine Clearance: 15.4 mL/min (A) (by C-G formula based on SCr of 3.7 mg/dL (H)). No results for input(s): VANCOTROUGH, VANCOPEAK, VANCORANDOM, GENTTROUGH, GENTPEAK, GENTRANDOM, TOBRATROUGH, TOBRAPEAK, TOBRARND, AMIKACINPEAK, AMIKACINTROU, AMIKACIN in the last 72 hours.   Microbiology: No results found for this or any previous visit (from the past 720 hour(s)).  Medical History: Past Medical History:  Diagnosis Date  . Arthritis   . Automatic implantable cardioverter-defibrillator in situ   . CAD in native artery 01/08/2016   Overview:  Cardiac cath 03/17/16: Conclusions Diagnostic Procedure Summary Severe global LV dysfunction. EF 35%, EDP=375mm Hg. Native RCA, LAD and Cx occluded. LIMA to LAD patent. SVG to RCA clear. Jump SVG to OM1 and OM2 patent. Has proximal stent, 25% narrowing. Diagnostic Procedure Recommendations Medical Therapy because no interventional therapy is required.  . Cataracts, bilateral   . COPD (chronic obstructive pulmonary disease) (HCC)   . Essential hypertension 01/08/2016  . GERD (gastroesophageal reflux disease)   . Hiatal hernia   . HOH (hard of hearing)   . Hyperlipidemia   . Hypertension   . Ischemic dilated cardiomyopathy (HCC) 06/07/2015  . Myocardial infarction (HCC) 1995  . PAF (paroxysmal atrial fibrillation) (HCC)  01/08/2016  . Peptic ulcer disease   . Peripheral vascular disease (HCC)   . VF (ventricular fibrillation) (HCC)    a. appropriate ICD therapy 12/18   Assessment: CC/HPI: abdominal pain, n/v/d, sats 76%>placed on NRB - pt's family states pt always hypotensive, hypoxic.    PMH: CHF, EF 35%, CAD, PAD, PAF, HLD, HTN, CABG 99, stents 014, COPD, +tobacco, ICD, noncompliance, arthritis, cataracts, GERD, HH, HOH, pacer,   Significant events:  12/13: presented to the hospital  after an unresponsive episode.  Device interrogation showed a ventricular fibrillation event.  He had one unsuccessful shock, followed by a successful defibrillation that returned him to sinus rhythm.  ID: Start empiric abx for Intraabdominal infection. LA 5.77. Scr 3.7 with K=6.3  Goal of Therapy:  Eradication of infection  Plan:  Zosyn 3.375g IV q 8 hrs.   Garmon Dehn S. Merilynn Finlandobertson, PharmD, BCPS Clinical Staff Pharmacist Pager [phone]  Misty Stanleyobertson, Cliffard Hair Stillinger 10/24/2017,1:04 PM

## 2017-10-24 NOTE — ED Notes (Signed)
Dr, Donell BeersByerly  at bedside.

## 2017-10-24 NOTE — Progress Notes (Signed)
Patient arrived to ER on non-rebreather mask per EMS.  Patient noted to have increased work of breathing however patient clear to auscultation and sats read 100%.  Patient complaining of continuous pain in abdomen area.  Says that breathing would improve if got pain under control.  Placed patient on 6L nasal cannula and sats of 100%.  Will continue to monitor and assess.

## 2017-10-24 NOTE — Anesthesia Procedure Notes (Addendum)
Central Venous Catheter Insertion Performed by: Dorris SinghGreen, Kyndra Condron, MD, anesthesiologist Start/End12/15/2018 6:35 PM, 10/24/2017 6:50 PM Patient location: OR. Preanesthetic checklist: patient identified, IV checked, site marked, risks and benefits discussed, surgical consent, monitors and equipment checked, pre-op evaluation and timeout performed Position: Trendelenburg Hand hygiene performed  and maximum sterile barriers used  Central line was placed.Double lumen Procedure performed using ultrasound guided technique. Ultrasound Notes:anatomy identified Attempts: 1 Following insertion, line sutured. Post procedure assessment: blood return through all ports  Patient tolerated the procedure well with no immediate complications.

## 2017-10-24 NOTE — Transfer of Care (Addendum)
Immediate Anesthesia Transfer of Care Note  Patient: Stephen Cohen  Procedure(s) Performed: HERNIA REPAIR INGUINAL ADULT (N/A Abdomen) EXPLORATORY LAPAROTOMY (N/A Abdomen) SMALL BOWEL RESECTION (N/A Abdomen) LAPAROSCOPY DIAGNOSTIC (N/A Abdomen) COLOSTOMY (Abdomen)  Patient Location: ICU  Anesthesia Type:General  Level of Consciousness: sedated and Patient remains intubated per anesthesia plan  Airway & Oxygen Therapy: Patient remains intubated per anesthesia plan and Patient placed on Ventilator (see vital sign flow sheet for setting)  Post-op Assessment: Report given to RN  Post vital signs: Reviewed and stable  Last Vitals:  Vitals:   10/24/17 1615 10/24/17 1921  BP: (!) 73/44   Pulse: 97   Resp: (!) 30   Temp:    SpO2: 100% 99%    Last Pain:  Vitals:   10/24/17 1428  TempSrc:   PainSc: 10-Worst pain ever         Complications: No apparent anesthesia complications.  Patient critically ill with vasopressors for BP.

## 2017-10-25 ENCOUNTER — Other Ambulatory Visit (HOSPITAL_COMMUNITY): Payer: Medicare Other

## 2017-10-25 DIAGNOSIS — N170 Acute kidney failure with tubular necrosis: Secondary | ICD-10-CM

## 2017-10-25 DIAGNOSIS — Z9911 Dependence on respirator [ventilator] status: Secondary | ICD-10-CM

## 2017-10-25 DIAGNOSIS — E872 Acidosis, unspecified: Secondary | ICD-10-CM

## 2017-10-25 DIAGNOSIS — A419 Sepsis, unspecified organism: Secondary | ICD-10-CM

## 2017-10-25 DIAGNOSIS — R6521 Severe sepsis with septic shock: Secondary | ICD-10-CM

## 2017-10-25 DIAGNOSIS — E875 Hyperkalemia: Secondary | ICD-10-CM

## 2017-10-25 LAB — BLOOD GAS, ARTERIAL
Acid-base deficit: 2 mmol/L (ref 0.0–2.0)
Bicarbonate: 22.1 mmol/L (ref 20.0–28.0)
FIO2: 30
O2 Saturation: 93.5 %
PEEP: 5 cmH2O
Patient temperature: 98.6
RATE: 30 resp/min
VT: 600 mL
pCO2 arterial: 36.6 mmHg (ref 32.0–48.0)
pH, Arterial: 7.399 (ref 7.350–7.450)
pO2, Arterial: 74.2 mmHg — ABNORMAL LOW (ref 83.0–108.0)

## 2017-10-25 LAB — BPAM RBC
BLOOD PRODUCT EXPIRATION DATE: 201812242359
Blood Product Expiration Date: 201812212359
ISSUE DATE / TIME: 201812151633
ISSUE DATE / TIME: 201812151633
UNIT TYPE AND RH: 9500
Unit Type and Rh: 9500

## 2017-10-25 LAB — LACTIC ACID, PLASMA
Lactic Acid, Venous: 2.9 mmol/L (ref 0.5–1.9)
Lactic Acid, Venous: 2.5 mmol/L (ref 0.5–1.9)

## 2017-10-25 LAB — BASIC METABOLIC PANEL
ANION GAP: 13 (ref 5–15)
Anion gap: 7 (ref 5–15)
BUN: 67 mg/dL — ABNORMAL HIGH (ref 6–20)
BUN: 50 mg/dL — AB (ref 6–20)
CALCIUM: 8.1 mg/dL — AB (ref 8.9–10.3)
CALCIUM: 8.1 mg/dL — AB (ref 8.9–10.3)
CO2: 21 mmol/L — ABNORMAL LOW (ref 22–32)
CO2: 22 mmol/L (ref 22–32)
CREATININE: 1.81 mg/dL — AB (ref 0.61–1.24)
Chloride: 111 mmol/L (ref 101–111)
Chloride: 117 mmol/L — ABNORMAL HIGH (ref 101–111)
Creatinine, Ser: 2.11 mg/dL — ABNORMAL HIGH (ref 0.61–1.24)
GFR calc Af Amer: 40 mL/min — ABNORMAL LOW (ref >60)
GFR, EST AFRICAN AMERICAN: 33 mL/min — AB (ref >60)
GFR, EST NON AFRICAN AMERICAN: 29 mL/min — AB (ref >60)
GFR, EST NON AFRICAN AMERICAN: 35 mL/min — AB (ref >60)
GLUCOSE: 97 mg/dL (ref 65–99)
Glucose, Bld: 95 mg/dL (ref 65–99)
Potassium: 5.2 mmol/L — ABNORMAL HIGH (ref 3.5–5.1)
Potassium: 5.1 mmol/L (ref 3.5–5.1)
Sodium: 145 mmol/L (ref 135–145)
Sodium: 146 mmol/L — ABNORMAL HIGH (ref 135–145)

## 2017-10-25 LAB — GLUCOSE, CAPILLARY
GLUCOSE-CAPILLARY: 92 mg/dL (ref 65–99)
GLUCOSE-CAPILLARY: 90 mg/dL (ref 65–99)
GLUCOSE-CAPILLARY: 93 mg/dL (ref 65–99)
GLUCOSE-CAPILLARY: 95 mg/dL (ref 65–99)
Glucose-Capillary: 107 mg/dL — ABNORMAL HIGH (ref 65–99)
Glucose-Capillary: 86 mg/dL (ref 65–99)
Glucose-Capillary: 98 mg/dL (ref 65–99)

## 2017-10-25 LAB — TYPE AND SCREEN
ABO/RH(D): O NEG
Antibody Screen: NEGATIVE
UNIT DIVISION: 0
UNIT DIVISION: 0

## 2017-10-25 LAB — CBC
HEMATOCRIT: 31.4 % — AB (ref 39.0–52.0)
Hemoglobin: 10.2 g/dL — ABNORMAL LOW (ref 13.0–17.0)
MCH: 30.1 pg (ref 26.0–34.0)
MCHC: 32.5 g/dL (ref 30.0–36.0)
MCV: 92.6 fL (ref 78.0–100.0)
PLATELETS: 169 10*3/uL (ref 150–400)
RBC: 3.39 MIL/uL — ABNORMAL LOW (ref 4.22–5.81)
RDW: 16.4 % — AB (ref 11.5–15.5)
WBC: 28.1 10*3/uL — AB (ref 4.0–10.5)

## 2017-10-25 LAB — BRAIN NATRIURETIC PEPTIDE: B Natriuretic Peptide: 1502.2 pg/mL — ABNORMAL HIGH (ref 0.0–100.0)

## 2017-10-25 LAB — PROCALCITONIN: Procalcitonin: 39.8 ng/mL

## 2017-10-25 LAB — APTT: aPTT: 47 seconds — ABNORMAL HIGH (ref 24–36)

## 2017-10-25 LAB — PROTIME-INR
INR: 1.72
Prothrombin Time: 20 seconds — ABNORMAL HIGH (ref 11.4–15.2)

## 2017-10-25 LAB — PHOSPHORUS: Phosphorus: 5.3 mg/dL — ABNORMAL HIGH (ref 2.5–4.6)

## 2017-10-25 LAB — MAGNESIUM: Magnesium: 2.2 mg/dL (ref 1.7–2.4)

## 2017-10-25 MED ORDER — SODIUM CHLORIDE 0.9 % IV BOLUS (SEPSIS)
1000.0000 mL | Freq: Once | INTRAVENOUS | Status: AC
  Administered 2017-10-25: 1000 mL via INTRAVENOUS

## 2017-10-25 MED ORDER — SODIUM CHLORIDE 0.9 % IV SOLN
INTRAVENOUS | Status: DC
  Administered 2017-10-25 (×2): via INTRAVENOUS

## 2017-10-25 MED ORDER — ALBUMIN HUMAN 25 % IV SOLN
INTRAVENOUS | Status: AC
  Filled 2017-10-25: qty 50

## 2017-10-25 MED ORDER — PIPERACILLIN-TAZOBACTAM 3.375 G IVPB
3.3750 g | Freq: Three times a day (TID) | INTRAVENOUS | Status: DC
  Administered 2017-10-25 – 2017-10-30 (×15): 3.375 g via INTRAVENOUS
  Filled 2017-10-25 (×17): qty 50

## 2017-10-25 MED ORDER — IPRATROPIUM-ALBUTEROL 0.5-2.5 (3) MG/3ML IN SOLN
3.0000 mL | Freq: Four times a day (QID) | RESPIRATORY_TRACT | Status: DC
  Administered 2017-10-25 – 2017-10-30 (×23): 3 mL via RESPIRATORY_TRACT
  Filled 2017-10-25 (×24): qty 3

## 2017-10-25 NOTE — Progress Notes (Signed)
Subjective OR yesterday for strangulated left inguinal hernia - exlap/Hartmann's + primary repair of LIH. He has been admitted to MICU postop for septic shock and respiratory failure requiring ventilator support. CCM has been following. Intubated/sedated on levophed - levophed weaning down  Objective: Vital signs in last 24 hours: Temp:  [97.5 F (36.4 C)-99.8 F (37.7 C)] 99.8 F (37.7 C) (12/16 0507) Pulse Rate:  [72-120] 90 (12/16 0800) Resp:  [15-30] 30 (12/16 0800) BP: (72-136)/(44-107) 108/46 (12/16 0338) SpO2:  [76 %-100 %] 93 % (12/16 0800) Arterial Line BP: (80-156)/(33-73) 104/42 (12/16 0800) FiO2 (%):  [30 %-100 %] 30 % (12/16 0400) Weight:  [64 kg (141 lb)-73.4 kg (161 lb 13.1 oz)] 73.4 kg (161 lb 13.1 oz) (12/16 0530)    Intake/Output from previous day: 12/15 0701 - 12/16 0700 In: 12148.6 [I.V.:3682.6; Blood:666; IV Piggyback:7800] Out: 2205 [Urine:1955; Emesis/NG output:100; Stool:50; Blood:100] Intake/Output this shift: Total I/O In: 163.8 [I.V.:163.8] Out: -   Gen: NAD, comfortable CV: RRR Pulm: Normal work of breathing Abd: Soft, mildly distended; colostomy pink with scant brown thin fluid in bag; incisions dressed and dry Ext: SCDs in place  Lab Results: CBC  Recent Labs    10/24/17 1939 10/24/17 2153 10/25/17 0426  WBC 33.2*  --  28.1*  HGB 10.4* 10.2* 10.2*  HCT 31.8* 30.0* 31.4*  PLT 163  --  169   BMET Recent Labs    10/24/17 1939 10/24/17 2153 10/25/17 0426  NA 141 146* 145  K 6.0* 5.4* 5.2*  CL 114* 110 111  CO2 15*  --  21*  GLUCOSE 72 136* 95  BUN 85* 63* 67*  CREATININE 2.79* 2.10* 2.11*  CALCIUM 6.9*  --  8.1*   PT/INR Recent Labs    10/24/17 1939 10/25/17 0426  LABPROT 21.9* 20.0*  INR 1.93 1.72   ABG Recent Labs    10/24/17 2003 10/24/17 2151  PHART 7.136* 7.289*  HCO3 16.1* 18.5*    Studies/Results:  Anti-infectives: Anti-infectives (From admission, onward)   Start     Dose/Rate Route Frequency Ordered  Stop   10/24/17 2100  piperacillin-tazobactam (ZOSYN) IVPB 2.25 g     2.25 g 100 mL/hr over 30 Minutes Intravenous Every 6 hours 10/24/17 1949     10/24/17 1945  piperacillin-tazobactam (ZOSYN) IVPB 3.375 g  Status:  Discontinued     3.375 g 12.5 mL/hr over 240 Minutes Intravenous Every 8 hours 10/24/17 1933 10/24/17 1948   10/24/17 1245  piperacillin-tazobactam (ZOSYN) IVPB 3.375 g     3.375 g 100 mL/hr over 30 Minutes Intravenous  Once 10/24/17 1242 10/24/17 1341       Assessment/Plan: Patient Active Problem List   Diagnosis Date Noted  . Ventilator dependent (HCC)   . Metabolic acidosis with increased anion gap and accumulation of organic acids   . Septic shock (HCC)   . Acute renal failure with tubular necrosis (HCC)   . Hyperkalemia   . Incarcerated left inguinal hernia 10/24/2017  . Hernia, inguinal, recurrent, with obstruction 10/24/2017  . Shortness of breath   . Peripheral vascular disease (HCC)   . Pacemaker   . Hyperlipidemia   . Coronary artery disease   . COPD (chronic obstructive pulmonary disease) (HCC)   . Automatic implantable cardioverter-defibrillator in situ   . History of bilateral inguinal hernias 07/09/2017  . H/O heart artery stent 06/24/2017  . Dyslipidemia 06/24/2017  . Hx of CABG 06/24/2017  . Chest pain 03/15/2016  . CAD in native artery 01/08/2016  .  Chronic anticoagulation 01/08/2016  . Essential hypertension 01/08/2016  . PAF (paroxysmal atrial fibrillation) (HCC) 01/08/2016  . Pulmonary emphysema (HCC) 01/08/2016  . AICD present, double chamber 06/07/2015  . Ischemic dilated cardiomyopathy (HCC) 06/07/2015  . Encounter for post surgical wound check 07/18/2014  . Erosion of graft 07/03/2014  . Visit for wound check 06/20/2014  . Peripheral vascular disease, unspecified (HCC) 02/14/2014  . Post op infection 12/21/2013  . PAD (peripheral artery disease) (HCC) 12/12/2013  . Dizziness and giddiness 08/30/2013  . Swelling of limb-Bilateral  leg 08/30/2013  . Pain in limb-Left leg 08/30/2013  . Nonpalpable pulse-Left groin area 08/30/2013  . Unstable angina (HCC) 11/11/2012  . 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 S/P PTCA (percutaneous transluminal coronary angioplasty) 11/11/2012  . Atherosclerosis of native arteries of the extremities with intermittent claudication 08/20/2012  . Aftercare following surgery of the circulatory system, NEC 08/20/2012  . Myocardial infarction (HCC) 11/10/1993   s/p Procedure(s): HERNIA REPAIR INGUINAL ADULT EXPLORATORY LAPAROTOMY SMALL BOWEL RESECTION LAPAROSCOPY DIAGNOSTIC COLOSTOMY 10/24/2017  -Appreciate CCM assistance in care -NPO, IVF  -Levophed weaning down -AKI - improving; adequate UOP -Hyperkalemia - improving -AF; WBC down-trending; continue broad spec IV abx for time being -PPx: SQH, SCDs, PPI -Dispo: Remains in ICU today   LOS: 1 day   Vernell Back M. Cliffton AstersWhite, M.D. [facility]

## 2017-10-25 NOTE — Progress Notes (Signed)
Pts HR noted to br flucuating between 130s and 150s. Dr. Craige CottaSood informed. Gave verbal order to infused 1L normal saline.

## 2017-10-25 NOTE — Progress Notes (Signed)
CRITICAL VALUE ALERT  Critical Value:  Lactic 5.4  Date & Time Notied:  10/25/17 0000  Provider Notified: Jovita KussmaulKatalina Eubanks NP  Orders Received/Actions taken: No new orders at this time

## 2017-10-25 NOTE — Progress Notes (Signed)
CRITICAL VALUE ALERT  Critical Value:  Lactic Acid 2.5  Date & Time Notied:  10/25/17 at 1236  Provider Notified: Dr. Craige CottaSood  Orders Received/Actions taken: No new verbal orders

## 2017-10-25 NOTE — Consult Note (Signed)
PULMONARY / CRITICAL CARE MEDICINE   Name: Stephen Cohen Sturkey MRN: [id number] DOB: 05/07/1941    ADMISSION DATE:  10/24/2017 CONSULTATION DATE:  10/24/2017  REFERRING MD:  Almond LintByerly, Faera  CHIEF COMPLAINT:  Ischemic bowel   HISTORY OF PRESENT ILLNESS:   76 year old male with past medical history of CAD, COPD, systolic CHF s/p AICD, HTN, HLD, PAF on eliquis, FEM-FEM bypass seen in the ED last week for VFIB with AICD firing presented on 12/15 with severe lower abdominal pain, N/V/D. Patient was hypotensive, hypoxic,  acute renal failure, hyperkalemic. Further workup showed possible incarcerated bowel. He was taken to the OR. No incarcerated bowel was seen but had ischemic sigmoid colon. Patient is now s/p colectomy with ostomy.   Patient was sent to the ICU intubated with an arterial line and central line. He was hypotensive, tachycardic, acidotic, hyperkalemic,. He received 2 L NS in the OR along with one dose of albumin and 1 unit PRBC. He received another 3-4 liters of LR in the ICU and was started on pressors and fentanyl ggt. He was also given multiple ampules of bicarb for his acidosis, D50 for hypoglycemia, and calcium gluconate for hyperkalemia.   Patient had to be started on fentanyl  because he woke up and was very agitated. After fentanyl drip has been he has been calm and is able to follow commands and states he no longer is in pain.   PAST MEDICAL HISTORY :  He  has a past medical history of Arthritis, Automatic implantable cardioverter-defibrillator in situ, CAD in native artery (01/08/2016), Cataracts, bilateral, COPD (chronic obstructive pulmonary disease) (HCC), Essential hypertension (01/08/2016), GERD (gastroesophageal reflux disease), Hiatal hernia, HOH (hard of hearing), Hyperlipidemia, Hypertension, Ischemic dilated cardiomyopathy (HCC) (06/07/2015), Myocardial infarction (HCC) (1995), PAF (paroxysmal atrial fibrillation) (HCC) (01/08/2016), Peptic ulcer disease, Peripheral vascular  disease (HCC), and VF (ventricular fibrillation) (HCC).  PAST SURGICAL HISTORY: He  has a past surgical history that includes pr vein bypass graft,aorto-fem-pop (1995  2005); Appendectomy; Coronary artery bypass graft (1995); Femoral-femoral Bypass Graft (N/A, 12/12/2013); Removal of graft (Bilateral, 12/12/2013); Coronary angioplasty; Colonoscopy; Femoral-femoral Bypass Graft (Bilateral, 07/03/2014); left heart catheterization with coronary angiogram (N/A, 11/11/2012); percutaneous coronary stent intervention (pci-s) (11/11/2012); and Cardiac defibrillator placement (02/01/2013).  No Known Allergies  No current facility-administered medications on file prior to encounter.    Current Outpatient Medications on File Prior to Encounter  Medication Sig  . acetaminophen (TYLENOL) 500 MG tablet Take 2,500 mg by mouth daily.  Marland Kitchen. albuterol (PROAIR HFA) 108 (90 Base) MCG/ACT inhaler Inhale into the lungs every 6 (six) hours as needed for wheezing or shortness of breath.  Marland Kitchen. apixaban (ELIQUIS) 5 MG TABS tablet Take 5 mg by mouth 2 (two) times daily.  Marland Kitchen. aspirin EC 81 MG tablet Take 81 mg by mouth daily.  Marland Kitchen. atorvastatin (LIPITOR) 10 MG tablet Take 10 mg by mouth at bedtime.   . carvedilol (COREG) 3.125 MG tablet Take 1 tablet (3.125 mg total) by mouth 2 (two) times daily with a meal.  . diphenhydrAMINE (BENADRYL) 25 mg capsule Take 50 mg by mouth 4 (four) times daily.   Marland Kitchen. esomeprazole (NEXIUM) 20 MG capsule Take 20 mg by mouth daily.   . Homeopathic Products (LEG CRAMP RELIEF) TABS Take 1 tablet by mouth 2 (two) times daily.  Marland Kitchen. lisinopril (PRINIVIL,ZESTRIL) 5 MG tablet Take 1 tablet (5 mg total) by mouth daily.  Marland Kitchen. loperamide (LOPERAMIDE A-D) 2 MG tablet Take 2 mg by mouth 4 (four) times daily as  needed for diarrhea or loose stools.  . Multiple Vitamins-Minerals (OCUVITE ADULT 50+ PO) Take 2 tablets by mouth daily.   Marland Kitchen POTASSIUM GLUCONATE PO Take 198 mg by mouth daily.  Marland Kitchen spironolactone (ALDACTONE) 25 MG tablet Take  1 tablet (25 mg total) by mouth daily.  Marland Kitchen torsemide (DEMADEX) 20 MG tablet Take 1 tablet (20 mg total) by mouth 2 (two) times daily. Take an extra tablet 20 mg mid day every other day starting today (Patient taking differently: Take 20 mg by mouth See admin instructions. Take Twice daily then take an extra 20 mg mid day every other day)  . umeclidinium-vilanterol (ANORO ELLIPTA) 62.5-25 MCG/INH AEPB Inhale 1 puff into the lungs daily.  Marland Kitchen amiodarone (PACERONE) 200 MG tablet Take 2 tablets (400 mg total) TWICE a day for 2 weeks, then take 2 tablets (400 mg total) ONCE a day for 2 weeks.  Marland Kitchen amiodarone (PACERONE) 200 MG tablet Take 1 tablet (200 mg total) by mouth daily.  . isosorbide mononitrate (IMDUR) 60 MG 24 hr tablet Take 1 tablet (60 mg total) by mouth daily.  . nitroGLYCERIN (NITROSTAT) 0.4 MG SL tablet Place 1 tablet (0.4 mg total) under the tongue every 5 (five) minutes as needed. For chest pain    FAMILY HISTORY:  His indicated that his mother is deceased. He indicated that his father is deceased. He indicated that his sister is alive.   SOCIAL HISTORY: He  reports that he has been smoking cigarettes.  He has a 65.00 pack-year smoking history. he has never used smokeless tobacco. He reports that he does not drink alcohol or use drugs.  REVIEW OF SYSTEMS:   Not obtained   VITAL SIGNS: BP (!) 136/51   Pulse 93   Temp 98.8 F (37.1 C) (Oral)   Resp (!) 30   Ht 5\' 11"  (1.803 m)   Wt 141 lb (64 kg)   SpO2 92%   BMI 19.67 kg/m   HEMODYNAMICS:    VENTILATOR SETTINGS: Vent Mode: PRVC FiO2 (%):  [50 %-100 %] 50 % Set Rate:  [18 bmp-30 bmp] 30 bmp Vt Set:  [600 mL] 600 mL PEEP:  [5 cmH20] 5 cmH20 Plateau Pressure:  [18 cmH20-19 cmH20] 18 cmH20  INTAKE / OUTPUT: I/O last 3 completed shifts: In: 6716 [I.V.:2500; Blood:666; IV Piggyback:3550] Out: 525 [Urine:425; Blood:100]  PHYSICAL EXAMINATION: General:  Elderly male appropriate for age lying in bed in moderate distress  intubated  Neuro:  PERRLA EOMI  Cardiovascular:  S1S2 tachycardic  Lungs:  Fair air entry B/L No wheezing appreciated  Abdomen:  Soft  Surgical dressing C/D/I  +ostomy -very dark red  Musculoskeletal:  +pulses in all 4 ext  Skin:  Intact   LABS:  BMET Recent Labs  Lab 10/22/17 1725 10/24/17 1155  10/24/17 1620  10/24/17 1816 10/24/17 1939 10/24/17 2153  NA 131* 136   < > 138   < > 145 141 146*  K 5.7* 6.5*   < > 6.1*   < > 5.5* 6.0* 5.4*  CL 100* 99*   < > 108  --   --  114* 110  CO2 21* 15*  --   --   --   --  15*  --   BUN 73* 109*   < > 111*  --   --  85* 63*  CREATININE 1.88* 4.07*   < > 3.50*  --   --  2.79* 2.10*  GLUCOSE 85 115*   < > 138*  --   --  72 136*   < > = values in this interval not displayed.    Electrolytes Recent Labs  Lab 10/22/17 1725 10/24/17 1155 10/24/17 1939  CALCIUM 9.0 8.7* 6.9*  MG 2.2  --  1.8  PHOS  --   --  9.1*    CBC Recent Labs  Lab 10/22/17 1725 10/24/17 1155  10/24/17 1816 10/24/17 1939 10/24/17 2153  WBC 12.2* 43.6*  --   --  33.2*  --   HGB 11.1* 12.5*   < > 9.9* 10.4* 10.2*  HCT 35.2* 39.2   < > 29.0* 31.8* 30.0*  PLT 309 286  --   --  163  --    < > = values in this interval not displayed.    Coag's Recent Labs  Lab 10/22/17 1725 10/24/17 1155 10/24/17 1939  APTT 40*  --  46*  INR 1.35 1.46 1.93    Sepsis Markers Recent Labs  Lab 10/24/17 1434 10/24/17 1937 10/24/17 1939 10/24/17 2238  LATICACIDVEN 3.68* 3.6*  --  5.4*  PROCALCITON  --   --  36.02  --     ABG Recent Labs  Lab 10/24/17 1816 10/24/17 2003 10/24/17 2151  PHART 7.236* 7.136* 7.289*  PCO2ART 47.5 47.2 38.6  PO2ART 357.0* 318.0* 96.0    Liver Enzymes Recent Labs  Lab 10/24/17 1155 10/24/17 1939  AST 58* 46*  ALT 26 23  ALKPHOS 110 76  BILITOT 1.2 1.8*  ALBUMIN 3.7 2.5*    Cardiac Enzymes Recent Labs  Lab 10/22/17 1725 10/24/17 1939  TROPONINI <0.03 0.04*    Glucose Recent Labs  Lab 10/22/17 1743  10/24/17 1532 10/24/17 2020 10/25/17 0018  GLUCAP 100* 71 63* 107*    Imaging Ct Abdomen Pelvis Wo Contrast  Result Date: 10/24/2017 CLINICAL DATA:  Worsening right lower quadrant abdominal pain. Nausea vomiting and diarrhea. EXAM: CT ABDOMEN AND PELVIS WITHOUT CONTRAST TECHNIQUE: Multidetector CT imaging of the abdomen and pelvis was performed following the standard protocol without IV contrast. COMPARISON:  09/24/2017 FINDINGS: Lower chest: Marked cylindrical bronchiectasis. Chronic interstitial lung changes with honeycombing in the lower lobes. Small hiatal hernia. Hepatobiliary: No focal liver abnormality is seen. No gallstones, gallbladder wall thickening, or biliary dilatation. Pancreas: Unremarkable. No pancreatic ductal dilatation or surrounding inflammatory changes. Spleen: Normal in size without focal abnormality. Adrenals/Urinary Tract: Adrenal glands are unremarkable. Kidneys are normal, without renal calculi, focal lesion, or hydronephrosis. Bladder is unremarkable. Stomach/Bowel: Normal stomach. No evidence of small-bowel obstruction. Post appendectomy. Left inguinal hernia contains loops of descending colon with mucosal thickening of the bowel within the hernial sac. The colon distal to the hernia is decompressed, while the colon proximal to the hernia is mildly prominent and filled with semiliquid stool. Vascular/Lymphatic: Aortic atherosclerosis. No enlarged abdominal or pelvic lymph nodes. Reproductive: Prostate is unremarkable. Other: By femoral graft present. Right fat containing inguinal hernia. No abdominopelvic ascites. Musculoskeletal: No suspicious osseous findings. IMPRESSION: Narrow neck left inguinal hernia containing short segment of descending colon. Moderate circumferential mucosal thickening of the bowel loop within the hernial sac is suggestive of possible incarceration. No evidence of high degree obstruction, however there is a caliber change of the colon at the hernia  with mild dilation of the colon upstream to the hernia, which may represent early or incomplete obstruction. The small bowel is normal. Chronic interstitial lung changes in the lung bases. Heavy aortic atherosclerosis. These results were called by telephone at the time of interpretation on 10/24/2017 at 1:13 pm to Dr. Shawna OrleansMELANIE  BELFI , who verbally acknowledged these results. Electronically Signed   By: Ted Mcalpine M.D.   On: 10/24/2017 13:18   Dg Chest Port 1 View  Result Date: 10/24/2017 CLINICAL DATA:  Ventilator dependent EXAM: PORTABLE CHEST 1 VIEW COMPARISON:  10/22/2017 FINDINGS: Endotracheal tube is 7 cm above the carina. Right central line tip is in the SVC. Left pacer/ AICD with leads in the right atrium and right ventricle. NG tube enters the stomach. Cardiomegaly. Bilateral interstitial and alveolar opacities, increasing since prior study, likely worsening edema. IMPRESSION: Worsening bilateral interstitial and alveolar opacities, likely worsening edema/CHF. Electronically Signed   By: Charlett Nose M.D.   On: 10/24/2017 20:09     STUDIES:    CULTURES: BC: 12/15  ANTIBIOTICS: Vancomycin 12/15 Zosyn 12/15  SIGNIFICANT EVENTS: 12/15- OR: s/p colectomy and colostomy   LINES/TUBES: PIV 12/15: IJ TLC 12/15: Vent   DISCUSSION: 76 year old male with extensive medical cormorbidities presents for severe abominable pain found to have ischemic sigmoid colon s/p colectomy with ostomy  ASSESSMENT / PLAN:  PULMONARY A:VDRF secondary to acute hypoxic respiratory failure secondary to ischemic bowel Hx of COPD P:   VAP precautions 8 cc/ kg CXR revived Duonebs q6 hour SBT in AM   CARDIOVASCULAR A: Shock secondary to hypovolemic and possible septic shock secondary to ischemic bowel  CAD Systolic CHF s/p AICD Recent VFIB s/p AICD firing PAF  Elevated troponin secondary to demand P:  Continue levophed--> pressor needs are coming down after aggressive fluid  administration Will continue IVF overnight but will need to be cautious due to history of severe systolic CHF with Ef 20%.  BNP in AM Hold all antihypertensives EKG reviewed Will hold eliqus for now and discuss with surgery when safe to start. Patient very high CHADVASC  ECHO in AM  RENAL A:  Acute renal failure secondary to pre-renal (hypovolemic) vs renal Anion gap metabolic acidosis secondary to lactic acidosis--> resolving Hyperkalemia  Hypocalcemia   P:   Received very aggressive fluid administration. Will continue with LR @125  cc over night Foley Monitor I/O--> patient has good urine output currently Replace and electrolytes as needed Hyperkalemia resolved   GASTROINTESTINAL A:  Acute ischemic sigmoid colon s/o colectomy and ostomy GERD P:   Maintain NPO for now Monitor ostomy output Pain control Protonix GI prophylaxis NGT to intermittent suction    HEMATOLOGIC A:  Anemia secondary to acute blood loss  Thrombocytopenia secondary to consumption secondary to acute abdominal pathology and sepsis  P:  Hold DVT prophylaxis--> SCD  Received one unit PRBC in the OR--> transfuse if Hg <7 No signs of bleeding at this time F/U PT/PTT/INR in AM  INFECTIOUS A:  Possible septic shock from ischemic bowel Leukocytosis secondary to sepsis and reactive   P:   Elevated procalcitonin--> continue to trend Continue with broad spectrum abx Vanco and Zosyn MRSA negative  ENDOCRINE A:  No active issue  P:   ISS  NEUROLOGIC A:  No active issues  P:   RASS goal: -1 to 0 Continue with pain control     Lionel December D.O  Pulmonary and Critical Care Medicine [facility] Pager: [phone]  10/25/2017, 12:22 AM

## 2017-10-25 NOTE — Progress Notes (Signed)
PULMONARY / CRITICAL CARE MEDICINE   Name: Stephen Cohen MRN: [id number] DOB: 10/18/1941    ADMISSION DATE:  10/24/2017 CONSULTATION DATE:  10/24/2017  REFERRING MD:  Almond Lint  CHIEF COMPLAINT:  Ischemic bowel   HISTORY OF PRESENT ILLNESS:   75 year old male with past medical history of CAD, COPD, systolic CHF s/p AICD, HTN, HLD, PAF on eliquis, FEM-FEM bypass seen in the ED last week for VFIB with AICD firing presented on 12/15 with severe lower abdominal pain, N/V/D. Patient was hypotensive, hypoxic,  acute renal failure, hyperkalemic. Further workup showed possible incarcerated bowel. He was taken to the OR. No incarcerated bowel was seen but had ischemic sigmoid colon. Patient is now s/p colectomy with ostomy.   Patient was sent to the ICU intubated with an arterial line and central line. He was hypotensive, tachycardic, acidotic, hyperkalemic,. He received 2 L NS in the OR along with one dose of albumin and 1 unit PRBC. He received another 3-4 liters of LR in the ICU and was started on pressors and fentanyl ggt. He was also given multiple ampules of bicarb for his acidosis, D50 for hypoglycemia, and calcium gluconate for hyperkalemia.   Patient had to be started on fentanyl  because he woke up and was very agitated. After fentanyl drip has been he has been calm and is able to follow commands and states he no longer is in pain.    Subjective:  intermittent agitation overnight, improved with adjustment to propofol and fent gtts.  Remains on levophed   VITAL SIGNS: BP (!) 108/46   Pulse 92   Temp 99.8 F (37.7 C) (Oral)   Resp (!) 30   Ht 5\' 11"  (1.803 m)   Wt 73.4 kg (161 lb 13.1 oz)   SpO2 93%   BMI 22.57 kg/m   HEMODYNAMICS:    VENTILATOR SETTINGS: Vent Mode: PRVC FiO2 (%):  [30 %-100 %] 30 % Set Rate:  [18 bmp-30 bmp] 30 bmp Vt Set:  [600 mL] 600 mL PEEP:  [5 cmH20] 5 cmH20 Plateau Pressure:  [18 cmH20-21 cmH20] 19 cmH20  INTAKE / OUTPUT: I/O last 3  completed shifts: In: 12148.6 [I.V.:3682.6; Blood:666; IV Piggyback:7800] Out: 2205 [Urine:1955; Emesis/NG output:100; Stool:50; Blood:100]  PHYSICAL EXAMINATION: General:  Elderly male appropriate for age lying in bed NAD on vent  Neuro:  PERRLA EOMI, RASS -1, intermittent agitation per RN  Cardiovascular:  S1S2 tachycardic  Lungs: resps even non labored on vent, no audible wheeze, few scattered rhonchi  Abdomen:  Soft  Surgical dressing C/D/I  +ostomy  Musculoskeletal:  +pulses in all 4 ext  Skin:  Intact   LABS:  BMET Recent Labs  Lab 10/24/17 1155  10/24/17 1939 10/24/17 2153 10/25/17 0426  NA 136   < > 141 146* 145  K 6.5*   < > 6.0* 5.4* 5.2*  CL 99*   < > 114* 110 111  CO2 15*  --  15*  --  21*  BUN 109*   < > 85* 63* 67*  CREATININE 4.07*   < > 2.79* 2.10* 2.11*  GLUCOSE 115*   < > 72 136* 95   < > = values in this interval not displayed.    Electrolytes Recent Labs  Lab 10/22/17 1725 10/24/17 1155 10/24/17 1939 10/25/17 0426  CALCIUM 9.0 8.7* 6.9* 8.1*  MG 2.2  --  1.8 2.2  PHOS  --   --  9.1* 5.3*    CBC Recent Labs  Lab 10/24/17 1155  10/24/17 1939 10/24/17 2153 10/25/17 0426  WBC 43.6*  --  33.2*  --  28.1*  HGB 12.5*   < > 10.4* 10.2* 10.2*  HCT 39.2   < > 31.8* 30.0* 31.4*  PLT 286  --  163  --  169   < > = values in this interval not displayed.    Coag's Recent Labs  Lab 10/22/17 1725 10/24/17 1155 10/24/17 1939 10/25/17 0426  APTT 40*  --  46* 47*  INR 1.35 1.46 1.93 1.72    Sepsis Markers Recent Labs  Lab 10/24/17 1937 10/24/17 1939 10/24/17 2238 10/25/17 0426  LATICACIDVEN 3.6*  --  5.4* 2.9*  PROCALCITON  --  36.02  --  39.80    ABG Recent Labs  Lab 10/24/17 1816 10/24/17 2003 10/24/17 2151  PHART 7.236* 7.136* 7.289*  PCO2ART 47.5 47.2 38.6  PO2ART 357.0* 318.0* 96.0    Liver Enzymes Recent Labs  Lab 10/24/17 1155 10/24/17 1939  AST 58* 46*  ALT 26 23  ALKPHOS 110 76  BILITOT 1.2 1.8*  ALBUMIN 3.7  2.5*    Cardiac Enzymes Recent Labs  Lab 10/22/17 1725 10/24/17 1939  TROPONINI <0.03 0.04*    Glucose Recent Labs  Lab 10/22/17 1743 10/24/17 1532 10/24/17 2020 10/25/17 0018 10/25/17 0429 10/25/17 0833  GLUCAP 100* 71 63* 107* 92 90    Imaging Ct Abdomen Pelvis Wo Contrast  Result Date: 10/24/2017 CLINICAL DATA:  Worsening right lower quadrant abdominal pain. Nausea vomiting and diarrhea. EXAM: CT ABDOMEN AND PELVIS WITHOUT CONTRAST TECHNIQUE: Multidetector CT imaging of the abdomen and pelvis was performed following the standard protocol without IV contrast. COMPARISON:  09/24/2017 FINDINGS: Lower chest: Marked cylindrical bronchiectasis. Chronic interstitial lung changes with honeycombing in the lower lobes. Small hiatal hernia. Hepatobiliary: No focal liver abnormality is seen. No gallstones, gallbladder wall thickening, or biliary dilatation. Pancreas: Unremarkable. No pancreatic ductal dilatation or surrounding inflammatory changes. Spleen: Normal in size without focal abnormality. Adrenals/Urinary Tract: Adrenal glands are unremarkable. Kidneys are normal, without renal calculi, focal lesion, or hydronephrosis. Bladder is unremarkable. Stomach/Bowel: Normal stomach. No evidence of small-bowel obstruction. Post appendectomy. Left inguinal hernia contains loops of descending colon with mucosal thickening of the bowel within the hernial sac. The colon distal to the hernia is decompressed, while the colon proximal to the hernia is mildly prominent and filled with semiliquid stool. Vascular/Lymphatic: Aortic atherosclerosis. No enlarged abdominal or pelvic lymph nodes. Reproductive: Prostate is unremarkable. Other: By femoral graft present. Right fat containing inguinal hernia. No abdominopelvic ascites. Musculoskeletal: No suspicious osseous findings. IMPRESSION: Narrow neck left inguinal hernia containing short segment of descending colon. Moderate circumferential mucosal thickening  of the bowel loop within the hernial sac is suggestive of possible incarceration. No evidence of high degree obstruction, however there is a caliber change of the colon at the hernia with mild dilation of the colon upstream to the hernia, which may represent early or incomplete obstruction. The small bowel is normal. Chronic interstitial lung changes in the lung bases. Heavy aortic atherosclerosis. These results were called by telephone at the time of interpretation on 10/24/2017 at 1:13 pm to Dr. Shawna OrleansMELANIE BELFI , who verbally acknowledged these results. Electronically Signed   By: Ted Mcalpineobrinka  Dimitrova M.D.   On: 10/24/2017 13:18   Dg Chest Port 1 View  Result Date: 10/24/2017 CLINICAL DATA:  Ventilator dependent EXAM: PORTABLE CHEST 1 VIEW COMPARISON:  10/22/2017 FINDINGS: Endotracheal tube is 7 cm above the carina. Right central line  tip is in the SVC. Left pacer/ AICD with leads in the right atrium and right ventricle. NG tube enters the stomach. Cardiomegaly. Bilateral interstitial and alveolar opacities, increasing since prior study, likely worsening edema. IMPRESSION: Worsening bilateral interstitial and alveolar opacities, likely worsening edema/CHF. Electronically Signed   By: Charlett NoseKevin  Dover M.D.   On: 10/24/2017 20:09     STUDIES:    CULTURES: BC: 12/15  ANTIBIOTICS: Vancomycin 12/15 Zosyn 12/15  SIGNIFICANT EVENTS: 12/15- OR: s/p colectomy and colostomy   LINES/TUBES: PIV 12/15: IJ TLC ETT 12/15>>>  DISCUSSION: 76 year old male with extensive medical cormorbidities presents for severe abominable pain found to have ischemic sigmoid colon s/p colectomy with ostomy 12/15  ASSESSMENT / PLAN:  PULMONARY VDRF secondary to acute hypoxic respiratory failure secondary to ischemic bowel Hx of COPD P:   Vent support - 8cc/kg  F/u CXR  F/u ABG Duonebs q6 hour Daily SBT once more clinically stable    CARDIOVASCULAR A: Shock secondary to hypovolemic and possible septic shock  secondary to ischemic bowel  CAD Systolic CHF s/p AICD Recent VFIB s/p AICD firing PAF  Elevated troponin secondary to demand P:  Continue levophed--> pressor needs are coming down slowly after volume resuscitation KVO IVF for now with increased BNP and severe sCHF EF 20%   BNP in AM Hold all antihypertensives EKG reviewed Will hold eliqus for now and discuss with surgery when safe to start.  ECHO pending   RENAL A:  Acute renal failure secondary to pre-renal (hypovolemic) vs renal Anion gap metabolic acidosis secondary to lactic acidosis--> resolving Hyperkalemia  Hypocalcemia   P:   KVO IVF  Monitor I/O--> patient has good urine output currently Replace and electrolytes as needed Hyperkalemia resolved  Ensure lactate cleared   GASTROINTESTINAL A:  Acute ischemic sigmoid colon s/o colectomy and ostomy 12/15 GERD P:   Maintain NPO for now Monitor ostomy output Pain control Protonix GI prophylaxis NGT to intermittent suction  Per surgery   HEMATOLOGIC A:  Anemia secondary to acute blood loss  Thrombocytopenia secondary to consumption secondary to acute abdominal pathology and sepsis  P:  SCD Received one unit PRBC in the OR--> transfuse if Hg <7 F/u CBC   INFECTIOUS A:  Possible septic shock from ischemic bowel Leukocytosis secondary to sepsis and reactive   P:   Elevated procalcitonin--> continue to trend Continue with broad spectrum abx Vanco and Zosyn MRSA negative  ENDOCRINE A:  No active issue  P:   Monitor glucose on chem    NEUROLOGIC A:  No active issues  P:   RASS goal: -1 to 0 Propofol, fentanyl gtt  Wean propofol as able given ongoing hypotension  If hypotension persists consider change to precedex  Daily WUA    No family available 12/16   Dirk DressKaty Conal Shetley, NP 10/25/2017  10:10 AM Pager: [phone] or [phone]

## 2017-10-26 ENCOUNTER — Ambulatory Visit: Payer: Medicare Other | Admitting: Cardiology

## 2017-10-26 ENCOUNTER — Inpatient Hospital Stay (HOSPITAL_COMMUNITY): Payer: Medicare Other

## 2017-10-26 ENCOUNTER — Encounter (HOSPITAL_COMMUNITY): Payer: Self-pay | Admitting: General Surgery

## 2017-10-26 DIAGNOSIS — I5030 Unspecified diastolic (congestive) heart failure: Secondary | ICD-10-CM

## 2017-10-26 DIAGNOSIS — K4030 Unilateral inguinal hernia, with obstruction, without gangrene, not specified as recurrent: Secondary | ICD-10-CM

## 2017-10-26 LAB — BASIC METABOLIC PANEL
Anion gap: 8 (ref 5–15)
BUN: 41 mg/dL — AB (ref 6–20)
CHLORIDE: 117 mmol/L — AB (ref 101–111)
CO2: 20 mmol/L — ABNORMAL LOW (ref 22–32)
CREATININE: 1.58 mg/dL — AB (ref 0.61–1.24)
Calcium: 7.7 mg/dL — ABNORMAL LOW (ref 8.9–10.3)
GFR, EST AFRICAN AMERICAN: 47 mL/min — AB (ref >60)
GFR, EST NON AFRICAN AMERICAN: 41 mL/min — AB (ref >60)
Glucose, Bld: 122 mg/dL — ABNORMAL HIGH (ref 65–99)
POTASSIUM: 4.8 mmol/L (ref 3.5–5.1)
SODIUM: 145 mmol/L (ref 135–145)

## 2017-10-26 LAB — CBC
HCT: 31.7 % — ABNORMAL LOW (ref 39.0–52.0)
HEMOGLOBIN: 10.1 g/dL — AB (ref 13.0–17.0)
MCH: 30.2 pg (ref 26.0–34.0)
MCHC: 31.9 g/dL (ref 30.0–36.0)
MCV: 94.9 fL (ref 78.0–100.0)
PLATELETS: 156 10*3/uL (ref 150–400)
RBC: 3.34 MIL/uL — AB (ref 4.22–5.81)
RDW: 16.2 % — ABNORMAL HIGH (ref 11.5–15.5)
WBC: 26.2 10*3/uL — ABNORMAL HIGH (ref 4.0–10.5)

## 2017-10-26 LAB — ECHOCARDIOGRAM COMPLETE
Height: 71 in
Weight: 2606.72 oz

## 2017-10-26 LAB — POCT I-STAT 3, ART BLOOD GAS (G3+)
Acid-base deficit: 5 mmol/L — ABNORMAL HIGH (ref 0.0–2.0)
Bicarbonate: 19 mmol/L — ABNORMAL LOW (ref 20.0–28.0)
O2 Saturation: 94 %
PCO2 ART: 32.5 mmHg (ref 32.0–48.0)
PH ART: 7.378 (ref 7.350–7.450)
PO2 ART: 75 mmHg — AB (ref 83.0–108.0)
Patient temperature: 99.8
TCO2: 20 mmol/L — ABNORMAL LOW (ref 22–32)

## 2017-10-26 LAB — GLUCOSE, CAPILLARY
GLUCOSE-CAPILLARY: 89 mg/dL (ref 65–99)
GLUCOSE-CAPILLARY: 81 mg/dL (ref 65–99)
Glucose-Capillary: 93 mg/dL (ref 65–99)
Glucose-Capillary: 87 mg/dL (ref 65–99)
Glucose-Capillary: 88 mg/dL (ref 65–99)

## 2017-10-26 LAB — PROCALCITONIN: Procalcitonin: 30.29 ng/mL

## 2017-10-26 LAB — MAGNESIUM: Magnesium: 2.2 mg/dL (ref 1.7–2.4)

## 2017-10-26 MED ORDER — AMIODARONE HCL IN DEXTROSE 360-4.14 MG/200ML-% IV SOLN
60.0000 mg/h | INTRAVENOUS | Status: AC
  Administered 2017-10-26 (×2): 60 mg/h via INTRAVENOUS
  Filled 2017-10-26: qty 200

## 2017-10-26 MED ORDER — PERFLUTREN LIPID MICROSPHERE
INTRAVENOUS | Status: AC
  Filled 2017-10-26: qty 10

## 2017-10-26 MED ORDER — PERFLUTREN LIPID MICROSPHERE
1.0000 mL | INTRAVENOUS | Status: AC | PRN
  Administered 2017-10-26: 4 mL via INTRAVENOUS
  Filled 2017-10-26: qty 10

## 2017-10-26 MED ORDER — AMIODARONE HCL IN DEXTROSE 360-4.14 MG/200ML-% IV SOLN
30.0000 mg/h | INTRAVENOUS | Status: DC
  Administered 2017-10-26 – 2017-10-30 (×9): 30 mg/h via INTRAVENOUS
  Filled 2017-10-26 (×17): qty 200

## 2017-10-26 MED ORDER — AMIODARONE LOAD VIA INFUSION
150.0000 mg | Freq: Once | INTRAVENOUS | Status: AC
  Administered 2017-10-26: 150 mg via INTRAVENOUS
  Filled 2017-10-26: qty 83.34

## 2017-10-26 NOTE — Progress Notes (Signed)
eLink Physician-Brief Progress Note Patient Name: Stephen Cohen DOB: August 16, 1941 MRN: [id number]   Date of Service  10/26/2017  HPI/Events of Note  AFIB with RVR - Ventricular rate 120-160. Currently on a Norepinephrine IV infusion for hemodynamic support. BP = 92/44 with MAP = 55 by A-line.   eICU Interventions  Will order: 1. Amiodarone IV load and infusion.  2. Restart Phenylephrine IV infusion and attempt to wean Norepinephrine IV infusion off.  3. Send AM BMP now. 4. Mg++ level STAT.     Intervention Category Major Interventions: Arrhythmia - evaluation and management  Markise Haymer Eugene 10/26/2017, 2:07 AM

## 2017-10-26 NOTE — Progress Notes (Signed)
PULMONARY / CRITICAL CARE MEDICINE   Name: Stephen Cohen MRN: [id number] DOB: 05-Mar-1941    ADMISSION DATE:  10/24/2017 CONSULTATION DATE:  10/24/2017  REFERRING MD:  Almond LintByerly, Faera  CHIEF COMPLAINT:  Ischemic bowel   HISTORY OF PRESENT ILLNESS:   76 year old male with past medical history of CAD, COPD, systolic CHF s/p AICD, HTN, HLD, PAF on eliquis, FEM-FEM bypass seen in the ED last week for VFIB with AICD firing presented on 12/15 with severe lower abdominal pain, N/V/D. Patient was hypotensive, hypoxic,  acute renal failure, hyperkalemic. Further workup showed possible incarcerated bowel. He was taken to the OR. No incarcerated bowel was seen but had ischemic sigmoid colon. Patient is now s/p colectomy with ostomy.   Patient was sent to the ICU intubated with an arterial line and central line. He was hypotensive, tachycardic, acidotic, hyperkalemic,. He received 2 L NS in the OR along with one dose of albumin and 1 unit PRBC. He received another 3-4 liters of LR in the ICU and was started on pressors and fentanyl ggt. He was also given multiple ampules of bicarb for his acidosis, D50 for hypoglycemia, and calcium gluconate for hyperkalemia.   Patient had to be started on fentanyl  because he woke up and was very agitated. After fentanyl drip has been he has been calm and is able to follow commands and states he no longer is in pain.    Subjective: Still on pressors. Weaning today AM. On amio for a fib. Levophed changed to neo.  VITAL SIGNS: BP 130/67 (BP Location: Left Arm)   Pulse 68   Temp 100.3 F (37.9 C) (Oral)   Resp (!) 30   Ht 5\' 11"  (1.803 m)   Wt 162 lb 14.7 oz (73.9 kg)   SpO2 97%   BMI 22.72 kg/m   HEMODYNAMICS: CVP:  [5 mmHg-11 mmHg] 5 mmHg  VENTILATOR SETTINGS: Vent Mode: PRVC FiO2 (%):  [30 %] 30 % Set Rate:  [30 bmp] 30 bmp Vt Set:  [600 mL] 600 mL PEEP:  [5 cmH20] 5 cmH20 Plateau Pressure:  [19 cmH20-20 cmH20] 19 cmH20  INTAKE / OUTPUT: I/O last 3  completed shifts: In: 10539.2 [I.V.:5009.2; NG/GT:80; IV Piggyback:5450] Out: 3985 [Urine:3810; Emesis/NG output:100; Stool:75]  PHYSICAL EXAMINATION: Gen:      No acute distress HEENT:  EOMI, sclera anicteric Neck:     No masses; no thyromegaly, ETT in place Lungs:    Clear to auscultation bilaterally; normal respiratory effort CV:         Regular rate and rhythm; no murmurs Abd:      + bowel sounds; soft, non-tender; no palpable masses, no distension Ext:    No edema; adequate peripheral perfusion Skin:      Warm and dry; no rash Neuro: Sedated, non responsive.   LABS:  BMET Recent Labs  Lab 10/25/17 0426 10/25/17 1724 10/26/17 0218  NA 145 146* 145  K 5.2* 5.1 4.8  CL 111 117* 117*  CO2 21* 22 20*  BUN 67* 50* 41*  CREATININE 2.11* 1.81* 1.58*  GLUCOSE 95 97 122*    Electrolytes Recent Labs  Lab 10/24/17 1939 10/25/17 0426 10/25/17 1724 10/26/17 0218  CALCIUM 6.9* 8.1* 8.1* 7.7*  MG 1.8 2.2  --  2.2  PHOS 9.1* 5.3*  --   --     CBC Recent Labs  Lab 10/24/17 1939 10/24/17 2153 10/25/17 0426 10/26/17 0218  WBC 33.2*  --  28.1* 26.2*  HGB 10.4* 10.2*  10.2* 10.1*  HCT 31.8* 30.0* 31.4* 31.7*  PLT 163  --  169 156    Coag's Recent Labs  Lab 10/22/17 1725 10/24/17 1155 10/24/17 1939 10/25/17 0426  APTT 40*  --  46* 47*  INR 1.35 1.46 1.93 1.72    Sepsis Markers Recent Labs  Lab 10/24/17 1939 10/24/17 2238 10/25/17 0426 10/25/17 1034 10/26/17 0218  LATICACIDVEN  --  5.4* 2.9* 2.5*  --   PROCALCITON 36.02  --  39.80  --  30.29    ABG Recent Labs  Lab 10/24/17 2151 10/25/17 1740 10/26/17 0429  PHART 7.289* 7.399 7.378  PCO2ART 38.6 36.6 32.5  PO2ART 96.0 74.2* 75.0*    Liver Enzymes Recent Labs  Lab 10/24/17 1155 10/24/17 1939  AST 58* 46*  ALT 26 23  ALKPHOS 110 76  BILITOT 1.2 1.8*  ALBUMIN 3.7 2.5*    Cardiac Enzymes Recent Labs  Lab 10/22/17 1725 10/24/17 1939  TROPONINI <0.03 0.04*    Glucose Recent Labs   Lab 10/25/17 1624 10/25/17 2014 10/25/17 2349 10/26/17 0418 10/26/17 0747 10/26/17 1206  GLUCAP 93 95 98 93 89 81    Imaging Dg Chest Portable 1 View  Result Date: 10/26/2017 CLINICAL DATA:  Respiratory failure.  Septic shock EXAM: PORTABLE CHEST 1 VIEW COMPARISON:  Portable chest x-ray of October 24, 2017 FINDINGS: The lungs are reasonably well expanded. There are persistent interstitial infiltrates bilaterally. The cardiac silhouette is mildly enlarged. The pulmonary vascularity is not clearly engorged. There is calcification in the wall of the aortic arch. There are post CABG changes. The ICD is in stable position. The endotracheal tube tip lies 5.3 cm above the carina. The esophagogastric tube tip projects below the inferior margin of the image with the proximal port at the GE junction. The right internal jugular venous catheter tip projects over the midportion of the SVC. IMPRESSION: Fairly stable appearance of the chest with bilateral pulmonary interstitial edema or less likely pneumonia. The support tubes are in reasonable position although advancement of the esophagogastric tube by 5-10 cm would assure that the proximal port is well below the GE junction. Electronically Signed   By: David  SwazilandJordan M.D.   On: 10/26/2017 07:15   STUDIES:   CULTURES: BC: 12/15  ANTIBIOTICS: Vancomycin 12/15 Zosyn 12/15  SIGNIFICANT EVENTS: 12/15- OR: s/p colectomy and colostomy   LINES/TUBES: PIV 12/15: IJ TLC ETT 12/15>>>  DISCUSSION: 76 year old male with extensive medical cormorbidities presents for severe abominable pain found to have ischemic sigmoid colon s/p colectomy with ostomy 12/15  ASSESSMENT / PLAN:  PULMONARY VDRF secondary to acute hypoxic respiratory failure secondary to ischemic bowel Hx of COPD P:   Vent support - 8cc/kg  Stat PSV trials Continue duonebs   CARDIOVASCULAR A: Shock secondary to hypovolemic and possible septic shock secondary to ischemic bowel   CAD Systolic CHF s/p AICD Recent VFIB s/p AICD firing PAF  Elevated troponin secondary to demand P:  Continue amio Wean down neo as tolerated Holding eliquis for now. Will start when cleared by surgery Hold antihypertensives Follow echo  RENAL A:  Acute renal failure secondary to pre-renal (hypovolemic) vs renal Anion gap metabolic acidosis secondary to lactic acidosis--> resolving Hyperkalemia  Hypocalcemia   P:   Monitor urine output and Cr.  Hyperkalemia resolved  Lactic acid is getting better  GASTROINTESTINAL A:  Acute ischemic sigmoid colon s/o colectomy and ostomy 12/15 GERD P:   Maintain NPO for now Monitor ostomy output Pain control Protonix GI  prophylaxis NGT to intermittent suction Management per surgery.  HEMATOLOGIC A:  Anemia secondary to acute blood loss  Thrombocytopenia secondary to consumption secondary to acute abdominal pathology and sepsis  P:  SCD Received one unit PRBC in the OR--> transfuse if Hg <7 Follow CBC  INFECTIOUS A:  Possible septic shock from ischemic bowel Leukocytosis secondary to sepsis and reactive   P:   Elevated procalcitonin--> continue to trend Continue zosyn. Off vanco MRSA negative  ENDOCRINE A:  No active issue  P:   Monitor glucose on chem   NEUROLOGIC A:  No active issues  P:   RASS goal: -1 to 0 Propofol, fentanyl gtt  Wean sedation Daily WUA   Family updated 12/17  The patient is critically ill with multiple organ system failure and requires high complexity decision making for assessment and support, frequent evaluation and titration of therapies, advanced monitoring, review of radiographic studies and interpretation of complex data.   Critical Care Time devoted to patient care services, exclusive of separately billable procedures, described in this note is 35 minutes.   Chilton Greathouse MD [facility] Pager [phone] If no answer or after 3pm call:  [phone] 10/26/2017, 1:03 PM

## 2017-10-26 NOTE — Progress Notes (Signed)
[facility]/Stephen Cohen Progress Note 2 Days Post-Op  Subjective: Patient on the ventilator, minimal settings.  Sleepy at first, awakened with manipulation.    Objective: Vital signs in last 24 hours: Temp:  [98.5 F (36.9 C)-101 F (38.3 C)] 100.3 F (37.9 C) (12/17 0814) Pulse Rate:  [71-138] 71 (12/17 0802) Resp:  [24-30] 30 (12/17 0802) BP: (109-130)/(44-67) 130/67 (12/17 0802) SpO2:  [92 %-99 %] 95 % (12/17 0802) Arterial Line BP: (84-149)/(37-65) 102/41 (12/17 0830) FiO2 (%):  [30 %] 30 % (12/17 0802) Weight:  [162 lb 14.7 oz (73.9 kg)] 162 lb 14.7 oz (73.9 kg) (12/17 0430)    Intake/Output from previous day: 12/16 0701 - 12/17 0700 In: 5106.6 [I.V.:3826.6; NG/GT:80; IV Piggyback:1200] Out: 2305 [Urine:2280; Stool:25] Intake/Output this shift: Total I/O In: 133.9 [I.V.:133.9] Out: 125 [Urine:125]  General: No acute distress.  Lungs: Clear.  Oxygenating well on FIO2 .30.  CXr not seen.  Abd: Midline wound is clean and without infection.  No bowel sounds.  Stoma is stained and protruding appropriately.    Extremities: No changes  Neuro: Sedated.  Lab Results:  @LABLAST2 (wbc:2,hgb:2,hct:2,plt:2) BMET ) Recent Labs    10/25/17 1724 10/26/17 0218  NA 146* 145  K 5.1 4.8  CL 117* 117*  CO2 22 20*  GLUCOSE 97 122*  BUN 50* 41*  CREATININE 1.81* 1.58*  CALCIUM 8.1* 7.7*   PT/INR Recent Labs    10/24/17 1939 10/25/17 0426  LABPROT 21.9* 20.0*  INR 1.93 1.72   ABG Recent Labs    10/25/17 1740 10/26/17 0429  PHART 7.399 7.378  HCO3 22.1 19.0*    Studies/Results: Ct Abdomen Pelvis Wo Contrast  Result Date: 10/24/2017 CLINICAL DATA:  Worsening right lower quadrant abdominal pain. Nausea vomiting and diarrhea. EXAM: CT ABDOMEN AND PELVIS WITHOUT CONTRAST TECHNIQUE: Multidetector CT imaging of the abdomen and pelvis was performed following the standard protocol without IV contrast. COMPARISON:  09/24/2017 FINDINGS: Lower chest: Marked cylindrical bronchiectasis.  Chronic interstitial lung changes with honeycombing in the lower lobes. Small hiatal hernia. Hepatobiliary: No focal liver abnormality is seen. No gallstones, gallbladder wall thickening, or biliary dilatation. Pancreas: Unremarkable. No pancreatic ductal dilatation or surrounding inflammatory changes. Spleen: Normal in size without focal abnormality. Adrenals/Urinary Tract: Adrenal glands are unremarkable. Kidneys are normal, without renal calculi, focal lesion, or hydronephrosis. Bladder is unremarkable. Stomach/Bowel: Normal stomach. No evidence of small-bowel obstruction. Post appendectomy. Left inguinal hernia contains loops of descending colon with mucosal thickening of the bowel within the hernial sac. The colon distal to the hernia is decompressed, while the colon proximal to the hernia is mildly prominent and filled with semiliquid stool. Vascular/Lymphatic: Aortic atherosclerosis. No enlarged abdominal or pelvic lymph nodes. Reproductive: Prostate is unremarkable. Other: By femoral graft present. Right fat containing inguinal hernia. No abdominopelvic ascites. Musculoskeletal: No suspicious osseous findings. IMPRESSION: Narrow neck left inguinal hernia containing short segment of descending colon. Moderate circumferential mucosal thickening of the bowel loop within the hernial sac is suggestive of possible incarceration. No evidence of high degree obstruction, however there is a caliber change of the colon at the hernia with mild dilation of the colon upstream to the hernia, which may represent early or incomplete obstruction. The small bowel is normal. Chronic interstitial lung changes in the lung bases. Heavy aortic atherosclerosis. These results were called by telephone at the time of interpretation on 10/24/2017 at 1:13 pm to Dr. Shawna OrleansMELANIE BELFI , who verbally acknowledged these results. Electronically Signed   By: Ted Mcalpineobrinka  Dimitrova M.D.   On: 10/24/2017  13:18   Dg Chest Portable 1 View  Result  Date: 10/26/2017 CLINICAL DATA:  Respiratory failure.  Septic shock EXAM: PORTABLE CHEST 1 VIEW COMPARISON:  Portable chest x-ray of October 24, 2017 FINDINGS: The lungs are reasonably well expanded. There are persistent interstitial infiltrates bilaterally. The cardiac silhouette is mildly enlarged. The pulmonary vascularity is not clearly engorged. There is calcification in the wall of the aortic arch. There are post CABG changes. The ICD is in stable position. The endotracheal tube tip lies 5.3 cm above the carina. The esophagogastric tube tip projects below the inferior margin of the image with the proximal port at the GE junction. The right internal jugular venous catheter tip projects over the midportion of the SVC. IMPRESSION: Fairly stable appearance of the chest with bilateral pulmonary interstitial edema or less likely pneumonia. The support tubes are in reasonable position although advancement of the esophagogastric tube by 5-10 cm would assure that the proximal port is well below the GE junction. Electronically Signed   By: David  SwazilandJordan M.D.   On: 10/26/2017 07:15   Dg Chest Port 1 View  Result Date: 10/24/2017 CLINICAL DATA:  Ventilator dependent EXAM: PORTABLE CHEST 1 VIEW COMPARISON:  10/22/2017 FINDINGS: Endotracheal tube is 7 cm above the carina. Right central line tip is in the SVC. Left pacer/ AICD with leads in the right atrium and right ventricle. NG tube enters the stomach. Cardiomegaly. Bilateral interstitial and alveolar opacities, increasing since prior study, likely worsening edema. IMPRESSION: Worsening bilateral interstitial and alveolar opacities, likely worsening edema/CHF. Electronically Signed   By: Charlett NoseKevin  Dover M.D.   On: 10/24/2017 20:09    Anti-infectives: Anti-infectives (From admission, onward)   Start     Dose/Rate Route Frequency Ordered Stop   10/25/17 1500  piperacillin-tazobactam (ZOSYN) IVPB 3.375 g     3.375 g 12.5 mL/hr over 240 Minutes Intravenous Every 8  hours 10/25/17 1456     10/24/17 2100  piperacillin-tazobactam (ZOSYN) IVPB 2.25 g  Status:  Discontinued     2.25 g 100 mL/hr over 30 Minutes Intravenous Every 6 hours 10/24/17 1949 10/25/17 1456   10/24/17 1945  piperacillin-tazobactam (ZOSYN) IVPB 3.375 g  Status:  Discontinued     3.375 g 12.5 mL/hr over 240 Minutes Intravenous Every 8 hours 10/24/17 1933 10/24/17 1948   10/24/17 1245  piperacillin-tazobactam (ZOSYN) IVPB 3.375 g     3.375 g 100 mL/hr over 30 Minutes Intravenous  Once 10/24/17 1242 10/24/17 1341      Assessment/Plan: s/p Procedure(s): HERNIA REPAIR INGUINAL ADULT EXPLORATORY LAPAROTOMY SMALL BOWEL RESECTION LAPAROSCOPY DIAGNOSTIC COLOSTOMY Continue foley due to strict I&O, patient critically ill and patient in ICU Hope to wean sedation and get extubated.  Patient still requiring pressors.  LOS: 2 days   Marta LamasJames O. Gae BonWyatt, III, MD, FACS [phone]--pager [phone]--office [facility] 10/26/2017

## 2017-10-26 NOTE — Progress Notes (Signed)
Pt placed back on full vent support due to change in heart rate & rhythm.

## 2017-10-26 NOTE — Progress Notes (Signed)
  Echocardiogram 2D Echocardiogram has been performed.  Janalyn HarderWest, Malichi Palardy R 10/26/2017, 11:37 AM

## 2017-10-26 NOTE — Consult Note (Addendum)
WOC Nurse ostomy consult note Stoma type/location: Colostomy surgery performed on 12/15.   Stomal assessment/size: Stoma is 100% brown slough, slightly above skin level, 1 1/2 inches  Peristomal assessment: intact skin surrounding Output: small amt tan liquid in the pouch, no stool or faltus  Ostomy pouching: 2pc.  Education provided: Pt is critically ill on the vent.  Will plan to begin teaching sessions when stable and out of ICU. Applied 2 piece pouch with a barrier ring to attempt to maintain a seal.  Supplies ordered to the bedside for staff nurses use. Enrolled patient in [employer] program: No Cammie Mcgeeawn Josie Burleigh MSN, RN, [place], [place], [place] [phone]

## 2017-10-27 ENCOUNTER — Telehealth: Payer: Self-pay | Admitting: Cardiology

## 2017-10-27 ENCOUNTER — Inpatient Hospital Stay (HOSPITAL_COMMUNITY): Payer: Medicare Other

## 2017-10-27 LAB — BASIC METABOLIC PANEL
ANION GAP: 7 (ref 5–15)
BUN: 33 mg/dL — ABNORMAL HIGH (ref 6–20)
CO2: 20 mmol/L — ABNORMAL LOW (ref 22–32)
Calcium: 7.8 mg/dL — ABNORMAL LOW (ref 8.9–10.3)
Chloride: 121 mmol/L — ABNORMAL HIGH (ref 101–111)
Creatinine, Ser: 1.64 mg/dL — ABNORMAL HIGH (ref 0.61–1.24)
GFR, EST AFRICAN AMERICAN: 45 mL/min — AB (ref >60)
GFR, EST NON AFRICAN AMERICAN: 39 mL/min — AB (ref >60)
GLUCOSE: 92 mg/dL (ref 65–99)
POTASSIUM: 4.4 mmol/L (ref 3.5–5.1)
SODIUM: 148 mmol/L — AB (ref 135–145)

## 2017-10-27 LAB — PHOSPHORUS: PHOSPHORUS: 3 mg/dL (ref 2.5–4.6)

## 2017-10-27 LAB — CBC
HCT: 28.5 % — ABNORMAL LOW (ref 39.0–52.0)
Hemoglobin: 9 g/dL — ABNORMAL LOW (ref 13.0–17.0)
MCH: 30.8 pg (ref 26.0–34.0)
MCHC: 31.6 g/dL (ref 30.0–36.0)
MCV: 97.6 fL (ref 78.0–100.0)
PLATELETS: 139 10*3/uL — AB (ref 150–400)
RBC: 2.92 MIL/uL — AB (ref 4.22–5.81)
RDW: 15.9 % — ABNORMAL HIGH (ref 11.5–15.5)
WBC: 20.7 10*3/uL — AB (ref 4.0–10.5)

## 2017-10-27 LAB — POCT I-STAT 3, ART BLOOD GAS (G3+)
Acid-base deficit: 5 mmol/L — ABNORMAL HIGH (ref 0.0–2.0)
Bicarbonate: 19.6 mmol/L — ABNORMAL LOW (ref 20.0–28.0)
O2 Saturation: 98 %
PCO2 ART: 32.5 mmHg (ref 32.0–48.0)
PH ART: 7.389 (ref 7.350–7.450)
PO2 ART: 108 mmHg (ref 83.0–108.0)
Patient temperature: 98.8
TCO2: 21 mmol/L — ABNORMAL LOW (ref 22–32)

## 2017-10-27 LAB — GLUCOSE, CAPILLARY
GLUCOSE-CAPILLARY: 81 mg/dL (ref 65–99)
GLUCOSE-CAPILLARY: 86 mg/dL (ref 65–99)
GLUCOSE-CAPILLARY: 74 mg/dL (ref 65–99)
Glucose-Capillary: 90 mg/dL (ref 65–99)
Glucose-Capillary: 79 mg/dL (ref 65–99)
Glucose-Capillary: 72 mg/dL (ref 65–99)

## 2017-10-27 LAB — HEPARIN LEVEL (UNFRACTIONATED): Heparin Unfractionated: 0.4 IU/mL (ref 0.30–0.70)

## 2017-10-27 LAB — MAGNESIUM: MAGNESIUM: 2.3 mg/dL (ref 1.7–2.4)

## 2017-10-27 LAB — TRIGLYCERIDES: Triglycerides: 153 mg/dL — ABNORMAL HIGH (ref <150)

## 2017-10-27 MED ORDER — VITAL AF 1.2 CAL PO LIQD
1000.0000 mL | ORAL | Status: DC
  Administered 2017-10-27: 1000 mL

## 2017-10-27 MED ORDER — HEPARIN BOLUS VIA INFUSION
3500.0000 [IU] | Freq: Once | INTRAVENOUS | Status: AC
  Administered 2017-10-27: 3500 [IU] via INTRAVENOUS
  Filled 2017-10-27: qty 3500

## 2017-10-27 MED ORDER — PROPOFOL 1000 MG/100ML IV EMUL
5.0000 ug/kg/min | INTRAVENOUS | Status: DC
  Administered 2017-10-27 – 2017-10-28 (×2): 10 ug/kg/min via INTRAVENOUS
  Filled 2017-10-27: qty 100

## 2017-10-27 MED ORDER — VITAL HIGH PROTEIN PO LIQD: 1000.0000 mL | ORAL | Status: DC

## 2017-10-27 MED ORDER — HEPARIN (PORCINE) IN NACL 100-0.45 UNIT/ML-% IJ SOLN
1550.0000 [IU]/h | INTRAMUSCULAR | Status: DC
  Administered 2017-10-27: 950 [IU]/h via INTRAVENOUS
  Administered 2017-10-28: 1050 [IU]/h via INTRAVENOUS
  Administered 2017-10-29: 1350 [IU]/h via INTRAVENOUS
  Administered 2017-10-30: 1500 [IU]/h via INTRAVENOUS
  Filled 2017-10-27 (×8): qty 250

## 2017-10-27 NOTE — Progress Notes (Signed)
Initial Nutrition Assessment  DOCUMENTATION CODES:   Non-severe (moderate) malnutrition in context of chronic illness  INTERVENTION:    Start trickle tube feeding via OG tube with Vital AF 1.2 at 20 ml/h to provide 480 ml, 576 kcal (676 kcal total with Propofol), 36 gm protein, 389 ml free water daily.  As tolerance established, increase by 10 ml every 4 hours to goal rate of 70 ml/h (1680 ml) to provide 2016 kcal (2116 kcal total with Propofol), 126 gm protein, 1362 ml free water daily.  NUTRITION DIAGNOSIS:   Moderate Malnutrition related to chronic illness(COPD, CAD) as evidenced by mild fat depletion, mild muscle depletion.  GOAL:   Patient will meet greater than or equal to 90% of their needs  MONITOR:   Vent status, Labs, Weight trends, TF tolerance, Skin  REASON FOR ASSESSMENT:   Ventilator, Consult Enteral/tube feeding initiation and management(verbal consult to start trickle TF)  ASSESSMENT:   76 yo male with PMH of HTN, HLD, MI, COPD, GERD, PVD, PUD, AICD, HOH, cardiomyopathy, CAD, PAF who was admitted on 12/15 with ischemic sigmoid colon. S/P hernia repair, colectomy, & colostomy on 12/15.   Spoke with CCM regarding nutrition plans for patient. RD to order trickle tube feeding via OG tube. Surgery notes also reviewed. Patient is currently intubated on ventilator support MV: 17.2 L/min Temp (24hrs), Avg:98.7 F (37.1 C), Min:98 F (36.7 C), Max:99.5 F (37.5 C)  Propofol: 3.8 ml/hr providing 100 kcal from lipid. Labs reviewed. Sodium 148 (H) Medications reviewed and include propofol.   NUTRITION - FOCUSED PHYSICAL EXAM:    Most Recent Value  Orbital Region  Mild depletion  Upper Arm Region  Unable to assess  Thoracic and Lumbar Region  Unable to assess  Buccal Region  Mild depletion  Temple Region  Mild depletion  Clavicle Bone Region  Mild depletion  Clavicle and Acromion Bone Region  Mild depletion  Scapular Bone Region  Unable to assess  Dorsal  Hand  Unable to assess  Patellar Region  Mild depletion  Anterior Thigh Region  Mild depletion  Posterior Calf Region  Moderate depletion  Edema (RD Assessment)  Mild  Hair  Reviewed  Eyes  Unable to assess  Mouth  Unable to assess  Skin  Reviewed  Nails  Unable to assess       Diet Order:  Diet NPO time specified  EDUCATION NEEDS:   No education needs have been identified at this time  Skin:  Skin Assessment: Skin Integrity Issues: Skin Integrity Issues:: Incisions Incisions: abdomen  Last BM:  12/18 colostomy, type 7  Height:   Ht Readings from Last 1 Encounters:  10/24/17 5\' 11"  (1.803 m)    Weight:   Wt Readings from Last 1 Encounters:  10/27/17 165 lb 12.6 oz (75.2 kg)    Ideal Body Weight:  78.2 kg  BMI:  Body mass index is 23.12 kg/m.  Estimated Nutritional Needs:   Kcal:  2030  Protein:  120-135 gm  Fluid:  2 L   Joaquin CourtsKimberly Harris, RD, LDN, CNSC Pager [phone] After Hours Pager [phone]

## 2017-10-27 NOTE — Progress Notes (Signed)
ANTICOAGULATION CONSULT NOTE  Pharmacy Consult for heparin Indication: atrial fibrillation  No Known Allergies  Patient Measurements: Height: 5\' 11"  (180.3 cm) Weight: 165 lb 12.6 oz (75.2 kg) IBW/kg (Calculated) : 75.3 Heparin Dosing Weight: 64 kg  Vital Signs: Temp: 100.2 F (37.9 C) (12/18 2012) Temp Source: Oral (12/18 2012) BP: 115/71 (12/18 2019) Pulse Rate: 71 (12/18 2019)  Labs: Recent Labs    10/25/17 0426 10/25/17 1724 10/26/17 0218 10/27/17 0426 10/27/17 1936  HGB 10.2*  --  10.1* 9.0*  --   HCT 31.4*  --  31.7* 28.5*  --   PLT 169  --  156 139*  --   APTT 47*  --   --   --   --   LABPROT 20.0*  --   --   --   --   INR 1.72  --   --   --   --   HEPARINUNFRC  --   --   --   --  0.40  CREATININE 2.11* 1.81* 1.58* 1.64*  --     Estimated Creatinine Clearance: 40.8 mL/min (A) (by C-G formula based on SCr of 1.64 mg/dL (H)).  Assessment: CC/HPI: abdominal pain, n/v/d, sats 76%>placed on NRB - pt's family states pt always hypotensive, hypoxic.    Anticoag: Afib. CHADS2VASC 4. On apixaban PTA (last dose 12/14) and pt w/ ischemic sigmoid colon s/p OR 12/15  Heme/Onc: H&H 9/28.5, Plt 139  Goal of Therapy:  Heparin level 0.3-0.7 units/ml Monitor platelets by anticoagulation protocol: Yes   Plan:  -Continue heparin at 950 units/hr -Daily HL, CBC    Agapito GamesAlison Brysyn Brandenberger, PharmD, BCPS Clinical Pharmacist 10/27/2017 8:41 PM

## 2017-10-27 NOTE — Telephone Encounter (Signed)
Has questions about his previous echo

## 2017-10-27 NOTE — Plan of Care (Signed)
  Not Progressing Role Relationship: Method of communication will improve 10/27/2017 2155 - Not Progressing by Leanord HawkingFields, Mimie Goering R, RN Variances Patient/family unavailable Note No family present at this time 10/27/2017 2154 - Progressing by Leanord HawkingFields, Marla Pouliot R, RN   Skin Integrity: Risk for impaired skin integrity will decrease 10/27/2017 2154 - Progressing by Leanord HawkingFields, Dalma Panchal R, RN   Nutrition: Adequate nutrition will be maintained 10/27/2017 2155 - Progressing by Leanord HawkingFields, Abrina Petz R, RN Note Pt started on tickle tube feeds today (12/18)   Progressing Nutrition: Adequate nutrition will be maintained 10/27/2017 2155 - Progressing by Leanord HawkingFields, Kal Chait R, RN Note Pt started on tickle tube feeds today (12/18) Skin Integrity: Risk for impaired skin integrity will decrease 10/27/2017 2154 - Progressing by Leanord HawkingFields, Johnesha Acheampong R, RN

## 2017-10-27 NOTE — Care Management Note (Signed)
Case Management Note  Patient Details  Name: Orvilla FusBilly R Scialdone MRN: [id number] Date of Birth: 1941-01-05  Subjective/Objective:     Pt admitted with ischemic bowel - now s/p colectomy with ostomy     Action/Plan:  PTA from home.  Pt remains on ventilator - may be prolonged wean due to underlying severe fibrosis and COPD.  CM will continue to follow for discharge needs   Expected Discharge Date:                  Expected Discharge Plan:     In-House Referral:     Discharge planning Services  CM Consult  Post Acute Care Choice:    Choice offered to:     DME Arranged:    DME Agency:     HH Arranged:    HH Agency:     Status of Service:     If discussed at [employer] Meetings, dates discussed:    Additional Comments:  Cherylann ParrClaxton, Erle Guster S, RN 10/27/2017, 3:06 PM

## 2017-10-27 NOTE — Progress Notes (Signed)
CCS/Shala Baumbach Progress Note 3 Days Post-Op  Subjective: Patient still on the ventilator.  Not weaning.  FIO2  30 Day Unplanned Readmission Risk Score     ED to [facility] (Current[facility] MED[facility]  30 Day Unplanned Readmission Risk Score (%)  22 Filed at 10/27/2017 0404     This score is the patient's risk of an unplanned readmission within 30 days of being discharged (0 -100%). The score is based on dignosis, age, lab data, medications, orders, and past utilization.   Low:  0-14.9   Medium: 15-49.9   High: 50- 69.9   Extreme: 70 and above        Sats are okay.  Arouses and gets immediately agitated.  Objective: Vital signs in last 24 hours: Temp:  [98 F (36.7 C)-100.3 F (37.9 C)] 98 F (36.7 C) (12/18 0400) Pulse Rate:  [58-97] 84 (12/18 0700) Resp:  [19-30] 30 (12/18 0700) BP: (101-136)/(48-71) 101/65 (12/18 0400) SpO2:  [86 %-99 %] 98 % (12/18 0700) Arterial Line BP: (81-137)/(33-54) 113/40 (12/18 0700) FiO2 (%):  [30 %-40 %] 30 % (12/18 0400) Weight:  [75.2 kg (165 lb 12.6 oz)] 75.2 kg (165 lb 12.6 oz) (12/18 0400) Last BM Date: 10/26/17  Intake/Output from previous day: 12/17 0701 - 12/18 0700 In: 2026.5 [I.V.:1751.5; NG/GT:90; IV Piggyback:125] Out: 1695 [Urine:1495; Emesis/NG output:200] Intake/Output this shift: No intake/output data recorded.  General: Agitated when aroused.    Lungs: Clear  Abd: Midline wound is dry.  Will needdaily dressing changes.  Colostomy is dark and partially necrotic, but I believe that it will be fine   Extremities: No changes  Neuro: Intact.  Difficult to see if he is oriented.  Lab Results:  @LABLAST2 (wbc:2,hgb:2,hct:2,plt:2) BMET ) Recent Labs    10/26/17 0218 10/27/17 0426  NA 145 148*  K 4.8 4.4  CL 117* 121*  CO2 20* 20*  GLUCOSE 122* 92  BUN 41* 33*  CREATININE 1.58* 1.64*  CALCIUM 7.7* 7.8*   PT/INR Recent Labs    10/24/17 1939 10/25/17 0426  LABPROT 21.9*  20.0*  INR 1.93 1.72   ABG Recent Labs    10/25/17 1740 10/26/17 0429  PHART 7.399 7.378  HCO3 22.1 19.0*    Studies/Results: Dg Chest Portable 1 View  Result Date: 10/26/2017 CLINICAL DATA:  Respiratory failure.  Septic shock EXAM: PORTABLE CHEST 1 VIEW COMPARISON:  Portable chest x-ray of October 24, 2017 FINDINGS: The lungs are reasonably well expanded. There are persistent interstitial infiltrates bilaterally. The cardiac silhouette is mildly enlarged. The pulmonary vascularity is not clearly engorged. There is calcification in the wall of the aortic arch. There are post CABG changes. The ICD is in stable position. The endotracheal tube tip lies 5.3 cm above the carina. The esophagogastric tube tip projects below the inferior margin of the image with the proximal port at the GE junction. The right internal jugular venous catheter tip projects over the midportion of the SVC. IMPRESSION: Fairly stable appearance of the chest with bilateral pulmonary interstitial edema or less likely pneumonia. The support tubes are in reasonable position although advancement of the esophagogastric tube by 5-10 cm would assure that the proximal port is well below the GE junction. Electronically Signed   By: David  SwazilandJordan M.D.   On: 10/26/2017 07:15    Anti-infectives: Anti-infectives (From admission, onward)   Start     Dose/Rate Route Frequency Ordered Stop   10/25/17 1500  piperacillin-tazobactam (ZOSYN) IVPB 3.375 g  3.375 g 12.5 mL/hr over 240 Minutes Intravenous Every 8 hours 10/25/17 1456     10/24/17 2100  piperacillin-tazobactam (ZOSYN) IVPB 2.25 g  Status:  Discontinued     2.25 g 100 mL/hr over 30 Minutes Intravenous Every 6 hours 10/24/17 1949 10/25/17 1456   10/24/17 1945  piperacillin-tazobactam (ZOSYN) IVPB 3.375 g  Status:  Discontinued     3.375 g 12.5 mL/hr over 240 Minutes Intravenous Every 8 hours 10/24/17 1933 10/24/17 1948   10/24/17 1245  piperacillin-tazobactam (ZOSYN) IVPB  3.375 g     3.375 g 100 mL/hr over 30 Minutes Intravenous  Once 10/24/17 1242 10/24/17 1341      Assessment/Plan: s/p Procedure(s): HERNIA REPAIR INGUINAL ADULT EXPLORATORY LAPAROTOMY SMALL BOWEL RESECTION LAPAROSCOPY DIAGNOSTIC COLOSTOMY Wean for possible extubation.  Consider tube feedings.  LOS: 3 days   Marta LamasJames O. Gae BonWyatt, III, MD, FACS [phone]--pager [phone]--office [facility] 10/27/2017

## 2017-10-27 NOTE — Telephone Encounter (Signed)
Josefa Halfdvised Jamie that we do not have full report of echocardiogram as we have not received the record yet and to [facility] [place]. Asher MuirJamie verbalized understanding.

## 2017-10-27 NOTE — Progress Notes (Signed)
ANTICOAGULATION CONSULT NOTE - Initial Consult  Pharmacy Consult for heparin Indication: atrial fibrillation  No Known Allergies  Patient Measurements: Height: 5\' 11"  (180.3 cm) Weight: 165 lb 12.6 oz (75.2 kg) IBW/kg (Calculated) : 75.3 Heparin Dosing Weight: 64 kg  Vital Signs: Temp: 98.8 F (37.1 C) (12/18 0802) Temp Source: Oral (12/18 0802) BP: 124/77 (12/18 0800) Pulse Rate: 95 (12/18 0800)  Labs: Recent Labs    10/24/17 1155  10/24/17 1939  10/25/17 0426 10/25/17 1724 10/26/17 0218 10/27/17 0426  HGB 12.5*   < > 10.4*   < > 10.2*  --  10.1* 9.0*  HCT 39.2   < > 31.8*   < > 31.4*  --  31.7* 28.5*  PLT 286  --  163  --  169  --  156 139*  APTT  --   --  46*  --  47*  --   --   --   LABPROT 17.6*  --  21.9*  --  20.0*  --   --   --   INR 1.46  --  1.93  --  1.72  --   --   --   CREATININE 4.07*   < > 2.79*   < > 2.11* 1.81* 1.58* 1.64*  TROPONINI  --   --  0.04*  --   --   --   --   --    < > = values in this interval not displayed.    Estimated Creatinine Clearance: 40.8 mL/min (A) (by C-G formula based on SCr of 1.64 mg/dL (H)).  Assessment: CC/HPI: abdominal pain, n/v/d, sats 76%>placed on NRB - pt's family states pt always hypotensive, hypoxic.    PMH: CHF, EF 35%, CAD, PAD, PAF, HLD, HTN, CABG 99, stents 014, COPD, +tobacco, ICD, noncompliance, arthritis, cataracts, GERD, HH, HOH, pacer,   Anticoag: Afib. CHADS2VASC 4. On apixaban PTA (last dose 12/14) and pt w/ ischemic sigmoid colon s/p OR 12/15  Renal: SCr 1.64  Heme/Onc: H&H 9/28.5, Plt 139  Goal of Therapy:  Heparin level 0.3-0.7 units/ml Monitor platelets by anticoagulation protocol: Yes   Plan:  Add hep gtt (ok per wyatt) - no need for BL labs since > 3 days since apixaban Heparin bolus 3500 units x 1 Heparin gtt 950 units/hr Initial lvl 2000 Daily HL CBC  Isaac BlissMichael Giorgi Debruin, PharmD, BCPS, BCCCP Clinical Pharmacist Clinical phone for 10/27/2017 from 7a-3:30p: [phone] If after 3:30p,  please call main pharmacy at: [phone] 10/27/2017 11:30 AM

## 2017-10-27 NOTE — Progress Notes (Signed)
PULMONARY / CRITICAL CARE MEDICINE   Name: Stephen Cohen MRN: [id number] DOB: [birth year]    ADMISSION DATE:  10/24/2017 CONSULTATION DATE:  10/24/2017  REFERRING MD:  Almond Lint  CHIEF COMPLAINT:  Ischemic bowel   HISTORY OF PRESENT ILLNESS:   76 year old male with past medical history of CAD, COPD, systolic CHF s/p AICD, HTN, HLD, PAF on eliquis, FEM-FEM bypass seen in the ED last week for VFIB with AICD firing presented on 12/15 with severe lower abdominal pain, N/V/D. Patient was hypotensive, hypoxic,  acute renal failure, hyperkalemic. Further workup showed possible incarcerated bowel. He was taken to the OR. No incarcerated bowel was seen but had ischemic sigmoid colon. Patient is now s/p colectomy with ostomy.   Patient was sent to the ICU intubated with an arterial line and central line. He was hypotensive, tachycardic, acidotic, hyperkalemic,. He received 2 L NS in the OR along with one dose of albumin and 1 unit PRBC. He received another 3-4 liters of LR in the ICU and was started on pressors and fentanyl ggt. He was also given multiple ampules of bicarb for his acidosis, D50 for hypoglycemia, and calcium gluconate for hyperkalemia.    Subjective: Did not wean well as he had tacycardia. On full vent support now.  On amio for a fib. Off pressors  VITAL SIGNS: BP 124/77 (BP Location: Left Arm)   Pulse 95   Temp 98.8 F (37.1 C) (Oral)   Resp (!) 29   Ht 5\' 11"  (1.803 m)   Wt 165 lb 12.6 oz (75.2 kg)   SpO2 96%   BMI 23.12 kg/m   HEMODYNAMICS:    VENTILATOR SETTINGS: Vent Mode: PRVC FiO2 (%):  [30 %-40 %] 30 % Set Rate:  [30 bmp] 30 bmp Vt Set:  [600 mL] 600 mL PEEP:  [5 cmH20] 5 cmH20 Pressure Support:  [8 cmH20] 8 cmH20 Plateau Pressure:  [13 cmH20-21 cmH20] 21 cmH20  INTAKE / OUTPUT: I/O last 3 completed shifts: In: 4207.8 [I.V.:3752.8; Other:60; NG/GT:170; IV Piggyback:225] Out: 2830 [Urine:2630; Emesis/NG output:200]  PHYSICAL EXAMINATION: Gen:       No acute distress HEENT:  EOMI, sclera anicteric Neck:     No masses; no thyromegaly, ETT in place Lungs:    Clear to auscultation bilaterally; normal respiratory effort CV:         Regular rate and rhythm; no murmurs Abd:      Ostomy, no distension Ext:    No edema; adequate peripheral perfusion Skin:      Warm and dry; no rash Neuro: Sedated, unresponsive  LABS:  BMET Recent Labs  Lab 10/25/17 1724 10/26/17 0218 10/27/17 0426  NA 146* 145 148*  K 5.1 4.8 4.4  CL 117* 117* 121*  CO2 22 20* 20*  BUN 50* 41* 33*  CREATININE 1.81* 1.58* 1.64*  GLUCOSE 97 122* 92    Electrolytes Recent Labs  Lab 10/24/17 1939 10/25/17 0426 10/25/17 1724 10/26/17 0218 10/27/17 0426  CALCIUM 6.9* 8.1* 8.1* 7.7* 7.8*  MG 1.8 2.2  --  2.2 2.3  PHOS 9.1* 5.3*  --   --  3.0    CBC Recent Labs  Lab 10/25/17 0426 10/26/17 0218 10/27/17 0426  WBC 28.1* 26.2* 20.7*  HGB 10.2* 10.1* 9.0*  HCT 31.4* 31.7* 28.5*  PLT 169 156 139*    Coag's Recent Labs  Lab 10/22/17 1725 10/24/17 1155 10/24/17 1939 10/25/17 0426  APTT 40*  --  46* 47*  INR 1.35 1.46 1.93 1.72  Sepsis Markers Recent Labs  Lab 10/24/17 1939 10/24/17 2238 10/25/17 0426 10/25/17 1034 10/26/17 0218  LATICACIDVEN  --  5.4* 2.9* 2.5*  --   PROCALCITON 36.02  --  39.80  --  30.29    ABG Recent Labs  Lab 10/25/17 1740 10/26/17 0429 10/27/17 1055  PHART 7.399 7.378 7.389  PCO2ART 36.6 32.5 32.5  PO2ART 74.2* 75.0* 108.0    Liver Enzymes Recent Labs  Lab 10/24/17 1155 10/24/17 1939  AST 58* 46*  ALT 26 23  ALKPHOS 110 76  BILITOT 1.2 1.8*  ALBUMIN 3.7 2.5*    Cardiac Enzymes Recent Labs  Lab 10/22/17 1725 10/24/17 1939  TROPONINI <0.03 0.04*    Glucose Recent Labs  Lab 10/26/17 1206 10/26/17 1547 10/26/17 2117 10/26/17 2326 10/27/17 0438 10/27/17 0758  GLUCAP 81 87 88 90 81 79    Imaging Dg Chest Port 1 View  Result Date: 10/27/2017 CLINICAL DATA:  Ventilator dependent.  EXAM: PORTABLE CHEST 1 VIEW COMPARISON:  Radiograph of October 26, 2017. FINDINGS: Stable cardiomediastinal silhouette. Atherosclerosis of thoracic aorta is noted. Endotracheal and nasogastric tubes are in grossly good position. Left-sided pacemaker is unchanged in position. Right internal jugular catheter is unchanged. No pneumothorax is noted. Stable bibasilar subsegmental atelectasis or edema is noted. No significant pleural effusions are noted. Bony thorax is unremarkable. IMPRESSION: Aortic atherosclerosis. Stable support apparatus. Stable bibasilar subsegmental atelectasis or edema. Electronically Signed   By: Lupita RaiderJames  Green Jr, M.D.   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 May be a prologed wean give the underlying severe fibrosis and COPD Continue duonebs  Check ANA, CCP, RF for work up of ILD  CARDIOVASCULAR A: Shock secondary to hypovolemic and possible septic shock secondary to ischemic bowel  CAD Systolic CHF s/p AICD Recent VFIB s/p AICD firing PAF  Elevated troponin secondary to demand P:  Continue amio Wean down pressors as tolerated Holding eliquis for now. Will check with surgery when we can restart. Hold antihypertensives  RENAL A:  Acute renal failure secondary to pre-renal (hypovolemic) vs renal Anion gap metabolic acidosis secondary to lactic acidosis--> resolving Hyperkalemia-  resolved  P:   Monitor urine output and Cr.   GASTROINTESTINAL A:  Acute ischemic sigmoid colon s/o colectomy and ostomy 12/15 GERD P:   Start tube feeds Monitor ostomy output Pain control Protonix GI prophylaxis Management per surgery.  HEMATOLOGIC A:  Anemia secondary to acute blood loss  Thrombocytopenia secondary to consumption secondary to acute abdominal pathology and sepsis  P:  SCD Follow CBC  INFECTIOUS A:  Possible septic shock from ischemic bowel Leukocytosis secondary to sepsis and reactive   P:   Elevated procalcitonin--> continue to trend Continue zosyn. MRSA negative  ENDOCRINE A:  No active issue  P:   Monitor glucose on chem   NEUROLOGIC A:  No active issues  P:   RASS goal: -1 to 0 Propofol, fentanyl gtt  Wean sedation Daily WUA  Family updated 12/17  The patient is critically ill with multiple organ system failure and requires high complexity decision making for assessment and support, frequent evaluation and titration of therapies, advanced monitoring, review of radiographic studies and interpretation of complex data.   Critical Care Time devoted to patient care services, exclusive of separately billable procedures, described in this note is 35 minutes.   Chilton GreathousePraveen Shoshanna Mcquitty MD [facility] Pager [phone] If no answer or after 3pm call: [phone] 10/27/2017, 11:06 AM

## 2017-10-28 ENCOUNTER — Inpatient Hospital Stay (HOSPITAL_COMMUNITY): Payer: Medicare Other

## 2017-10-28 DIAGNOSIS — A419 Sepsis, unspecified organism: Principal | ICD-10-CM

## 2017-10-28 DIAGNOSIS — R6521 Severe sepsis with septic shock: Secondary | ICD-10-CM

## 2017-10-28 DIAGNOSIS — Z9911 Dependence on respirator [ventilator] status: Secondary | ICD-10-CM

## 2017-10-28 LAB — MAGNESIUM: Magnesium: 2.3 mg/dL (ref 1.7–2.4)

## 2017-10-28 LAB — HEPARIN LEVEL (UNFRACTIONATED): Heparin Unfractionated: 0.29 IU/mL — ABNORMAL LOW (ref 0.30–0.70)

## 2017-10-28 LAB — CBC
HCT: 29.8 % — ABNORMAL LOW (ref 39.0–52.0)
HEMOGLOBIN: 9.2 g/dL — AB (ref 13.0–17.0)
MCH: 30.8 pg (ref 26.0–34.0)
MCHC: 30.9 g/dL (ref 30.0–36.0)
MCV: 99.7 fL (ref 78.0–100.0)
Platelets: 142 10*3/uL — ABNORMAL LOW (ref 150–400)
RBC: 2.99 MIL/uL — AB (ref 4.22–5.81)
RDW: 16 % — AB (ref 11.5–15.5)
WBC: 16 10*3/uL — AB (ref 4.0–10.5)

## 2017-10-28 LAB — GLUCOSE, CAPILLARY
GLUCOSE-CAPILLARY: 106 mg/dL — AB (ref 65–99)
GLUCOSE-CAPILLARY: 84 mg/dL (ref 65–99)
GLUCOSE-CAPILLARY: 86 mg/dL (ref 65–99)
Glucose-Capillary: 89 mg/dL (ref 65–99)
Glucose-Capillary: 90 mg/dL (ref 65–99)
Glucose-Capillary: 92 mg/dL (ref 65–99)
Glucose-Capillary: 99 mg/dL (ref 65–99)

## 2017-10-28 LAB — BASIC METABOLIC PANEL
ANION GAP: 12 (ref 5–15)
BUN: 31 mg/dL — ABNORMAL HIGH (ref 6–20)
CALCIUM: 8 mg/dL — AB (ref 8.9–10.3)
CO2: 20 mmol/L — ABNORMAL LOW (ref 22–32)
Chloride: 118 mmol/L — ABNORMAL HIGH (ref 101–111)
Creatinine, Ser: 1.66 mg/dL — ABNORMAL HIGH (ref 0.61–1.24)
GFR, EST AFRICAN AMERICAN: 45 mL/min — AB (ref >60)
GFR, EST NON AFRICAN AMERICAN: 38 mL/min — AB (ref >60)
Glucose, Bld: 84 mg/dL (ref 65–99)
POTASSIUM: 4.1 mmol/L (ref 3.5–5.1)
Sodium: 150 mmol/L — ABNORMAL HIGH (ref 135–145)

## 2017-10-28 LAB — PHOSPHORUS: PHOSPHORUS: 4 mg/dL (ref 2.5–4.6)

## 2017-10-28 LAB — ANA W/REFLEX IF POSITIVE: Anti Nuclear Antibody(ANA): NEGATIVE

## 2017-10-28 MED ORDER — FUROSEMIDE 10 MG/ML IJ SOLN
40.0000 mg | Freq: Two times a day (BID) | INTRAMUSCULAR | Status: DC
  Administered 2017-10-28 – 2017-10-30 (×5): 40 mg via INTRAVENOUS
  Filled 2017-10-28 (×6): qty 4

## 2017-10-28 NOTE — Progress Notes (Signed)
ANTICOAGULATION CONSULT NOTE   Pharmacy Consult for heparin Indication: atrial fibrillation  No Known Allergies  Patient Measurements: Height: 5\' 11"  (180.3 cm) Weight: 164 lb 14.5 oz (74.8 kg) IBW/kg (Calculated) : 75.3 Heparin Dosing Weight: 64 kg  Vital Signs: Temp: 100.7 F (38.2 C) (12/19 0755) Temp Source: Oral (12/19 0755) BP: 124/60 (12/19 0800) Pulse Rate: 86 (12/19 0800)  Labs: Recent Labs    10/26/17 0218 10/27/17 0426 10/27/17 1936 10/28/17 0429 10/28/17 0857  HGB 10.1* 9.0*  --  9.2*  --   HCT 31.7* 28.5*  --  29.8*  --   PLT 156 139*  --  142*  --   HEPARINUNFRC  --   --  0.40  --  0.29*  CREATININE 1.58* 1.64*  --  1.66*  --     Estimated Creatinine Clearance: 40.1 mL/min (A) (by C-G formula based on SCr of 1.66 mg/dL (H)).  Assessment: CC/HPI: abdominal pain, n/v/d, sats 76%>placed on NRB - pt's family states pt always hypotensive, hypoxic.    PMH: CHF, EF 35%, CAD, PAD, PAF, HLD, HTN, CABG 99, stents 014, COPD, +tobacco, ICD, noncompliance, arthritis, cataracts, GERD, HH, HOH, pacer,   Anticoag: Afib. CHADS2VASC 4. On apixaban PTA (last dose 12/14) and pt w/ ischemic sigmoid colon s/p OR 12/15 AM hep lvl slightly low at 0.29 on 950 units/hr  Renal: SCr 1.66  Heme/Onc: H&H 9.2/29.8, Plt 142  Goal of Therapy:  Heparin level 0.3-0.7 units/ml Monitor platelets by anticoagulation protocol: Yes   Plan:  Increase hep to 1050 units/hr  no need to re-check lvl today Daily HL, CBC  Isaac BlissMichael Dayleen Beske, PharmD, BCPS, BCCCP Clinical Pharmacist Clinical phone for 10/28/2017 from 7a-3:30p: [phone] If after 3:30p, please call main pharmacy at: [phone] 10/28/2017 9:28 AM

## 2017-10-28 NOTE — Addendum Note (Signed)
Addendum  created 10/28/17 2056 by Dorris SinghGreen, Vee Bahe, MD   Intraprocedure Blocks edited, Sign clinical note

## 2017-10-28 NOTE — Progress Notes (Signed)
Pt's temp 100.6, ice packs applied, will continue to monitor

## 2017-10-28 NOTE — Progress Notes (Signed)
Pt's temp 100.9 and Dr. Isaiah SergeMannam notified.

## 2017-10-28 NOTE — Progress Notes (Signed)
. [facility]/Mairyn Lenahan Progress Note 4 Days Post-Op  Subjective: Patient recently extubated.  Not really coherrent.  Oxygen saturations okay, but borderline  Objective: Vital signs in last 24 hours: Temp:  [98 F (36.7 C)-101 F (38.3 C)] 101 F (38.3 C) (12/19 1155) Pulse Rate:  [62-128] 128 (12/19 1115) Resp:  [12-30] 26 (12/19 1115) BP: (91-132)/(50-89) 91/65 (12/19 1027) SpO2:  [82 %-99 %] 94 % (12/19 1115) Arterial Line BP: (90-122)/(40-46) 121/46 (12/18 2100) FiO2 (%):  [30 %] 30 % (12/19 1027) Weight:  [74.8 kg (164 lb 14.5 oz)] 74.8 kg (164 lb 14.5 oz) (12/19 0500) Last BM Date: 10/26/17  Intake/Output from previous day: 12/18 0701 - 12/19 0700 In: 1270 [I.V.:1212.5; NG/GT:20; IV Piggyback:37.5] Out: 990 [Urine:985; Stool:5] Intake/Output this shift: Total I/O In: 145.2 [I.V.:105.2; NG/GT:40] Out: 225 [Urine:225]  General: Breathing heavily.  Lungs: Clear.  Sats marginal  Abd: Soft, no bowel sounds.  OGT out.  No tube feedings.  Will evaluate for possible swallowing problems tomorrow after the patient has awakened more.  Colostomy looks a bit better to me with some pink tissue under the superficial surface  Extremities: No changes  Neuro: Awake, not really alert.  Not following commands.  Lab Results:  @LABLAST2 (wbc:2,hgb:2,hct:2,plt:2) BMET ) Recent Labs    10/27/17 0426 10/28/17 0429  NA 148* 150*  K 4.4 4.1  CL 121* 118*  CO2 20* 20*  GLUCOSE 92 84  BUN 33* 31*  CREATININE 1.64* 1.66*  CALCIUM 7.8* 8.0*   PT/INR No results for input(s): LABPROT, INR in the last 72 hours. ABG Recent Labs    10/26/17 0429 10/27/17 1055  PHART 7.378 7.389  HCO3 19.0* 19.6*    Studies/Results: Dg Chest Port 1 View  Result Date: 10/28/2017 CLINICAL DATA:  Acute respiratory failure EXAM: PORTABLE CHEST 1 VIEW COMPARISON:  10/27/2017 FINDINGS: Endotracheal tube in good position. NG tube enters stomach with the tip not visualized. Central venous catheter tip SVC. AICD  unchanged in position. Prominent interstitial lung markings are stable mostly due to chronic lung disease and fibrosis. Progression of bibasilar atelectasis since the prior study. IMPRESSION: Support lines remain in good position Diffuse pulmonary fibrosis with progression of bibasilar atelectasis. Electronically Signed   By: Marlan Palauharles  Clark M.D.   On: 10/28/2017 06:49   Dg Chest Port 1 View  Result Date: 10/27/2017 CLINICAL DATA:  Ventilator dependent. EXAM: PORTABLE CHEST 1 VIEW COMPARISON:  Radiograph of October 26, 2017. FINDINGS: Stable cardiomediastinal silhouette. Atherosclerosis of thoracic aorta is noted. Endotracheal and nasogastric tubes are in grossly good position. Left-sided pacemaker is unchanged in position. Right internal jugular catheter is unchanged. No pneumothorax is noted. Stable bibasilar subsegmental atelectasis or edema is noted. No significant pleural effusions are noted. Bony thorax is unremarkable. IMPRESSION: Aortic atherosclerosis. Stable support apparatus. Stable bibasilar subsegmental atelectasis or edema. Electronically Signed   By: Lupita RaiderJames  Green Jr, M.D.   On: 10/27/2017 07:54    Anti-infectives: Anti-infectives (From admission, onward)   Start     Dose/Rate Route Frequency Ordered Stop   10/25/17 1500  piperacillin-tazobactam (ZOSYN) IVPB 3.375 g     3.375 g 12.5 mL/hr over 240 Minutes Intravenous Every 8 hours 10/25/17 1456     10/24/17 2100  piperacillin-tazobactam (ZOSYN) IVPB 2.25 g  Status:  Discontinued     2.25 g 100 mL/hr over 30 Minutes Intravenous Every 6 hours 10/24/17 1949 10/25/17 1456   10/24/17 1945  piperacillin-tazobactam (ZOSYN) IVPB 3.375 g  Status:  Discontinued     3.375 g  12.5 mL/hr over 240 Minutes Intravenous Every 8 hours 10/24/17 1933 10/24/17 1948   10/24/17 1245  piperacillin-tazobactam (ZOSYN) IVPB 3.375 g     3.375 g 100 mL/hr over 30 Minutes Intravenous  Once 10/24/17 1242 10/24/17 1341      Assessment/Plan: s/p  Procedure(s): HERNIA REPAIR INGUINAL ADULT EXPLORATORY LAPAROTOMY SMALL BOWEL RESECTION LAPAROSCOPY DIAGNOSTIC COLOSTOMY Evaluate for swalling tomorrow.  If nont able to sustain himself with place Cortrak and start tube feedings tomorrow.  LOS: 4 days   Marta LamasJames O. Gae BonWyatt, III, MD, FACS [phone]--pager [phone]--office [facility] 10/28/2017

## 2017-10-28 NOTE — Progress Notes (Signed)
PCCM Interval Progress Note  Tolerating PS wean at 5/5 well.  Has tachycardia to 130 but suspect that this is more due to agitation from wanting ETT out. He is able to follow all commands and has great volumes on PS 5/5.  Will go ahead with extubation.  In addition, he is +13L.  Will start 40mg  lasix BID.   Rutherford Guysahul Ashby Moskal, [place]PA - [facility] Pager: [phone]  or [phone] 10/28/2017, 11:14 AM

## 2017-10-28 NOTE — Progress Notes (Signed)
Wasted 200 cc fentanyl with P. McKin[place] in sink.

## 2017-10-28 NOTE — Progress Notes (Signed)
PULMONARY / CRITICAL CARE MEDICINE   Name: Stephen Cohen MRN: [id number] DOB: 07-Jan-1941    ADMISSION DATE:  10/24/2017 CONSULTATION DATE:  10/24/2017  REFERRING MD:  Almond LintByerly, Faera  CHIEF COMPLAINT:  Ischemic bowel   HISTORY OF PRESENT ILLNESS:   76 year old male with past medical history of CAD, COPD, systolic CHF s/p AICD, HTN, HLD, PAF on eliquis, FEM-FEM bypass seen in the ED last week for VFIB with AICD firing presented on 12/15 with severe lower abdominal pain, N/V/D. Patient was hypotensive, hypoxic,  acute renal failure, hyperkalemic. Further workup showed possible incarcerated bowel. He was taken to the OR. No incarcerated bowel was seen but had ischemic sigmoid colon. Patient is now s/p colectomy with ostomy.   Patient was sent to the ICU intubated with an arterial line and central line. He was hypotensive, tachycardic, acidotic, hyperkalemic,. He received 2 L NS in the OR along with one dose of albumin and 1 unit PRBC. He received another 3-4 liters of LR in the ICU and was started on pressors and fentanyl ggt. He was also given multiple ampules of bicarb for his acidosis, D50 for hypoglycemia, and calcium gluconate for hyperkalemia.    Subjective: Off neo since last night.  Awake on vent this AM, sedation being weaned down   VITAL SIGNS: BP 124/60 (BP Location: Left Arm)   Pulse 86   Temp (!) 100.7 F (38.2 C) (Oral) Comment: Manufacturing engineerMarybeth RN notified  Resp (!) 30   Ht 5\' 11"  (1.803 m)   Wt 74.8 kg (164 lb 14.5 oz)   SpO2 95%   BMI 23.00 kg/m   HEMODYNAMICS:    VENTILATOR SETTINGS: Vent Mode: PRVC FiO2 (%):  [30 %-40 %] 30 % Set Rate:  [30 bmp] 30 bmp Vt Set:  [600 mL] 600 mL PEEP:  [5 cmH20] 5 cmH20 Plateau Pressure:  [18 cmH20-21 cmH20] 21 cmH20  INTAKE / OUTPUT: I/O last 3 completed shifts: In: 2087.2 [I.V.:1824.7; Other:60; NG/GT:90; IV Piggyback:112.5] Out: 1935 [Urine:1730; Emesis/NG output:200; Stool:5]  PHYSICAL EXAMINATION: Gen:      Adult male, No  acute distress HEENT:  EOMI, sclera anicteric Neck:     No masses; no thyromegaly, ETT in place Lungs:    Unlabored respirations, faint basilar crackles CV:         IRIR, no murmurs Abd:      Ostomy, no distension Ext:    No edema; adequate peripheral perfusion Skin:      Warm and dry; no rash Neuro:   Sedated, opens and moves eyes spontaneously, does not follow commands yet  LABS:  BMET Recent Labs  Lab 10/26/17 0218 10/27/17 0426 10/28/17 0429  NA 145 148* 150*  K 4.8 4.4 4.1  CL 117* 121* 118*  CO2 20* 20* 20*  BUN 41* 33* 31*  CREATININE 1.58* 1.64* 1.66*  GLUCOSE 122* 92 84    Electrolytes Recent Labs  Lab 10/25/17 0426  10/26/17 0218 10/27/17 0426 10/28/17 0429  CALCIUM 8.1*   < > 7.7* 7.8* 8.0*  MG 2.2  --  2.2 2.3 2.3  PHOS 5.3*  --   --  3.0 4.0   < > = values in this interval not displayed.    CBC Recent Labs  Lab 10/26/17 0218 10/27/17 0426 10/28/17 0429  WBC 26.2* 20.7* 16.0*  HGB 10.1* 9.0* 9.2*  HCT 31.7* 28.5* 29.8*  PLT 156 139* 142*    Coag's Recent Labs  Lab 10/22/17 1725 10/24/17 1155 10/24/17 1939 10/25/17 0426  APTT 40*  --  46* 47*  INR 1.35 1.46 1.93 1.72    Sepsis Markers Recent Labs  Lab 10/24/17 1939 10/24/17 2238 10/25/17 0426 10/25/17 1034 10/26/17 0218  LATICACIDVEN  --  5.4* 2.9* 2.5*  --   PROCALCITON 36.02  --  39.80  --  30.29    ABG Recent Labs  Lab 10/25/17 1740 10/26/17 0429 10/27/17 1055  PHART 7.399 7.378 7.389  PCO2ART 36.6 32.5 32.5  PO2ART 74.2* 75.0* 108.0    Liver Enzymes Recent Labs  Lab 10/24/17 1155 10/24/17 1939  AST 58* 46*  ALT 26 23  ALKPHOS 110 76  BILITOT 1.2 1.8*  ALBUMIN 3.7 2.5*    Cardiac Enzymes Recent Labs  Lab 10/22/17 1725 10/24/17 1939  TROPONINI <0.03 0.04*    Glucose Recent Labs  Lab 10/27/17 1141 10/27/17 1519 10/27/17 2008 10/28/17 0005 10/28/17 0339 10/28/17 0729  GLUCAP 86 74 72 86 90 92    Imaging Dg Chest Port 1 View  Result  Date: 10/28/2017 CLINICAL DATA:  Acute respiratory failure EXAM: PORTABLE CHEST 1 VIEW COMPARISON:  10/27/2017 FINDINGS: Endotracheal tube in good position. NG tube enters stomach with the tip not visualized. Central venous catheter tip SVC. AICD unchanged in position. Prominent interstitial lung markings are stable mostly due to chronic lung disease and fibrosis. Progression of bibasilar atelectasis since the prior study. IMPRESSION: Support lines remain in good position Diffuse pulmonary fibrosis with progression of bibasilar atelectasis. Electronically Signed   By: Marlan Palau M.D.   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 Defer to surgery on timing of restart - on heparin in the meantime Hold antihypertensives  RENAL A:   Acute renal failure secondary to pre-renal (hypovolemic) vs renal Anion gap metabolic acidosis secondary to lactic  acidosis --> resolved, but now hyperchloremic acidosis 12/19 Hyperkalemia- resolved Hypernatremia P:   Monitor urine output and Cr D/c saline Follow BMP  GASTROINTESTINAL A:  Acute ischemic sigmoid colon s/p colectomy and ostomy 12/15 GERD P:   Continue TF's Monitor ostomy output Pain control Protonix GI prophylaxis Management per surgery  HEMATOLOGIC A:   Anemia secondary to acute blood loss  Thrombocytopenia secondary to consumption in setting of acute abdominal pathology and sepsis P:  Transfuse for Hgb < 7 SCD Follow CBC   INFECTIOUS A:   Possible septic shock from ischemic bowel Leukocytosis secondary to sepsis and reactive  P:   Continue zosyn Follow PCT  ENDOCRINE A:  No active issue  P:   Monitor glucose on chem   NEUROLOGIC A:   Sedation due to mechanical ventilation P:   RASS goal: -1 to 0 Propofol, fentanyl gtt  Wean sedation to facilitate PSV wean Daily WUA   Family updated 12/17.  None available 12/19.   Rutherford Guysahul Desai, [place]PA - [facility] [place] [facility] Pager: [phone]  or [phone] 10/28/2017, 8:47 AM

## 2017-10-28 NOTE — Procedures (Signed)
Extubation Procedure Note  Patient Details:   Name: Stephen Cohen DOB: [birth year] MRN: [id number]   Airway Documentation:     Evaluation  O2 sats: stable throughout Complications: No apparent complications Patient did tolerate procedure well. Bilateral Breath Sounds: Diminished   Yes pt able to vocalize.   Pt extubated at this time per MD order. Pt able to breathe around deflated cuff. Pt trying to clear throat but will not cough on command. Pt placed on 4L Donald and Spo2 stable at this time. No stridor noted.   [name], [name] [name] 10/28/2017, 11:19 AM

## 2017-10-29 ENCOUNTER — Inpatient Hospital Stay (HOSPITAL_COMMUNITY): Payer: Medicare Other

## 2017-10-29 ENCOUNTER — Ambulatory Visit: Payer: Medicare Other | Admitting: Cardiology

## 2017-10-29 DIAGNOSIS — J9601 Acute respiratory failure with hypoxia: Secondary | ICD-10-CM

## 2017-10-29 DIAGNOSIS — E440 Moderate protein-calorie malnutrition: Secondary | ICD-10-CM

## 2017-10-29 LAB — GLUCOSE, CAPILLARY
GLUCOSE-CAPILLARY: 100 mg/dL — AB (ref 65–99)
GLUCOSE-CAPILLARY: 102 mg/dL — AB (ref 65–99)
GLUCOSE-CAPILLARY: 135 mg/dL — AB (ref 65–99)
Glucose-Capillary: 103 mg/dL — ABNORMAL HIGH (ref 65–99)
Glucose-Capillary: 107 mg/dL — ABNORMAL HIGH (ref 65–99)
Glucose-Capillary: 133 mg/dL — ABNORMAL HIGH (ref 65–99)

## 2017-10-29 LAB — CULTURE, BLOOD (ROUTINE X 2)
CULTURE: NO GROWTH
CULTURE: NO GROWTH
Special Requests: ADEQUATE

## 2017-10-29 LAB — CBC
HEMATOCRIT: 30.9 % — AB (ref 39.0–52.0)
HEMOGLOBIN: 9.2 g/dL — AB (ref 13.0–17.0)
MCH: 30.2 pg (ref 26.0–34.0)
MCHC: 29.8 g/dL — AB (ref 30.0–36.0)
MCV: 101.3 fL — ABNORMAL HIGH (ref 78.0–100.0)
Platelets: 122 10*3/uL — ABNORMAL LOW (ref 150–400)
RBC: 3.05 MIL/uL — ABNORMAL LOW (ref 4.22–5.81)
RDW: 15.8 % — AB (ref 11.5–15.5)
WBC: 16.9 10*3/uL — AB (ref 4.0–10.5)

## 2017-10-29 LAB — CYCLIC CITRUL PEPTIDE ANTIBODY, IGG/IGA: CCP ANTIBODIES IGG/IGA: 7 U (ref 0–19)

## 2017-10-29 LAB — BASIC METABOLIC PANEL
ANION GAP: 10 (ref 5–15)
BUN: 35 mg/dL — ABNORMAL HIGH (ref 6–20)
CALCIUM: 8.1 mg/dL — AB (ref 8.9–10.3)
CO2: 22 mmol/L (ref 22–32)
CREATININE: 1.89 mg/dL — AB (ref 0.61–1.24)
Chloride: 119 mmol/L — ABNORMAL HIGH (ref 101–111)
GFR calc non Af Amer: 33 mL/min — ABNORMAL LOW (ref >60)
GFR, EST AFRICAN AMERICAN: 38 mL/min — AB (ref >60)
Glucose, Bld: 104 mg/dL — ABNORMAL HIGH (ref 65–99)
Potassium: 4.2 mmol/L (ref 3.5–5.1)
SODIUM: 151 mmol/L — AB (ref 135–145)

## 2017-10-29 LAB — POCT I-STAT 3, ART BLOOD GAS (G3+)
Acid-base deficit: 4 mmol/L — ABNORMAL HIGH (ref 0.0–2.0)
Bicarbonate: 21.7 mmol/L (ref 20.0–28.0)
O2 Saturation: 89 %
PCO2 ART: 42.1 mmHg (ref 32.0–48.0)
PH ART: 7.32 — AB (ref 7.350–7.450)
TCO2: 23 mmol/L (ref 22–32)
pO2, Arterial: 63 mmHg — ABNORMAL LOW (ref 83.0–108.0)

## 2017-10-29 LAB — HEPARIN LEVEL (UNFRACTIONATED)
HEPARIN UNFRACTIONATED: 0.27 [IU]/mL — AB (ref 0.30–0.70)
HEPARIN UNFRACTIONATED: 0.17 [IU]/mL — AB (ref 0.30–0.70)
Heparin Unfractionated: 0.26 IU/mL — ABNORMAL LOW (ref 0.30–0.70)

## 2017-10-29 LAB — LACTIC ACID, PLASMA: LACTIC ACID, VENOUS: 1.5 mmol/L (ref 0.5–1.9)

## 2017-10-29 LAB — PROCALCITONIN: PROCALCITONIN: 5.88 ng/mL

## 2017-10-29 MED ORDER — SODIUM CHLORIDE 0.9 % IV SOLN
0.4000 ug/kg/h | INTRAVENOUS | Status: DC
  Administered 2017-10-29: 0.5 ug/kg/h via INTRAVENOUS
  Administered 2017-10-29: 0.4 ug/kg/h via INTRAVENOUS
  Administered 2017-10-29 – 2017-10-30 (×2): 0.5 ug/kg/h via INTRAVENOUS
  Filled 2017-10-29 (×3): qty 2

## 2017-10-29 MED ORDER — METHYLPREDNISOLONE SODIUM SUCC 125 MG IJ SOLR
60.0000 mg | Freq: Four times a day (QID) | INTRAMUSCULAR | Status: DC
  Administered 2017-10-29 – 2017-10-30 (×4): 60 mg via INTRAVENOUS
  Filled 2017-10-29: qty 2
  Filled 2017-10-29: qty 0.96
  Filled 2017-10-29: qty 2
  Filled 2017-10-29: qty 0.96

## 2017-10-29 NOTE — Progress Notes (Signed)
PULMONARY / CRITICAL CARE MEDICINE   Name: Stephen Cohen MRN: [id number] DOB: 02/12/1941    ADMISSION DATE:  10/24/2017 CONSULTATION DATE:  10/24/2017  REFERRING MD:  Almond LintByerly, Faera  CHIEF COMPLAINT:  Ischemic bowel   HISTORY OF PRESENT ILLNESS:   76 year old male with past medical history of CAD, COPD, systolic CHF s/p AICD, HTN, HLD, PAF on eliquis, FEM-FEM bypass seen in the ED last week for VFIB with AICD firing presented on 12/15 with severe lower abdominal pain, N/V/D. Patient was hypotensive, hypoxic,  acute renal failure, hyperkalemic. Further workup showed possible incarcerated bowel. He was taken to the OR. No incarcerated bowel was seen but had ischemic sigmoid colon. Patient is now s/p colectomy with ostomy.   Patient was sent to the ICU intubated with an arterial line and central line. He was hypotensive, tachycardic, acidotic, hyperkalemic,. He received 2 L NS in the OR along with one dose of albumin and 1 unit PRBC. He received another 3-4 liters of LR in the ICU and was started on pressors and fentanyl ggt. He was also given multiple ampules of bicarb for his acidosis, D50 for hypoglycemia, and calcium gluconate for hyperkalemia.    Subjective: Extubated 10/28/2017 and on 10/29/2017 with increased work of breathing increasing FiO2 demands.  We will check chest x-ray and ABG and lactic acid for completeness.  He may need reintubation.   VITAL SIGNS: BP 106/75   Pulse 94   Temp 99.4 F (37.4 C) (Oral)   Resp 19   Ht 5\' 11"  (1.803 m)   Wt 71 kg (156 lb 8.4 oz)   SpO2 96%   BMI 21.83 kg/m   HEMODYNAMICS:    VENTILATOR SETTINGS:    INTAKE / OUTPUT: I/O last 3 completed shifts: In: 1849.9 [I.V.:1649.9; NG/GT:100; IV Piggyback:100] Out: 3635 [Urine:3620; Stool:15]  PHYSICAL EXAMINATION: General: Frail elderly male with increased work of breathing  HEENT: Currently with 55% facemask in place.  No JVD appreciated at this time. PSY: Dull effect with some  confusion Neuro: Follows commands when spoken to in the lower commanding voice CV: Heart sounds are regular intermittent periods of atrial fibrillation remains on amiodarone and heparin drip PULM: Decreased air movement, coarse rhonchi with bibasilar crackles GI: Colostomy in place, abdominal dressing dry and intact. Extremities: warm/dry 1 edema  Skin: no rashes or lesions  LABS:  BMET Recent Labs  Lab 10/27/17 0426 10/28/17 0429 10/29/17 0318  NA 148* 150* 151*  K 4.4 4.1 4.2  CL 121* 118* 119*  CO2 20* 20* 22  BUN 33* 31* 35*  CREATININE 1.64* 1.66* 1.89*  GLUCOSE 92 84 104*    Electrolytes Recent Labs  Lab 10/25/17 0426  10/26/17 0218 10/27/17 0426 10/28/17 0429 10/29/17 0318  CALCIUM 8.1*   < > 7.7* 7.8* 8.0* 8.1*  MG 2.2  --  2.2 2.3 2.3  --   PHOS 5.3*  --   --  3.0 4.0  --    < > = values in this interval not displayed.    CBC Recent Labs  Lab 10/27/17 0426 10/28/17 0429 10/29/17 0318  WBC 20.7* 16.0* 16.9*  HGB 9.0* 9.2* 9.2*  HCT 28.5* 29.8* 30.9*  PLT 139* 142* 122*    Coag's Recent Labs  Lab 10/22/17 1725 10/24/17 1155 10/24/17 1939 10/25/17 0426  APTT 40*  --  46* 47*  INR 1.35 1.46 1.93 1.72    Sepsis Markers Recent Labs  Lab 10/24/17 1939 10/24/17 2238 10/25/17 0426 10/25/17 1034  10/26/17 0218  LATICACIDVEN  --  5.4* 2.9* 2.5*  --   PROCALCITON 36.02  --  39.80  --  30.29    ABG Recent Labs  Lab 10/25/17 1740 10/26/17 0429 10/27/17 1055  PHART 7.399 7.378 7.389  PCO2ART 36.6 32.5 32.5  PO2ART 74.2* 75.0* 108.0    Liver Enzymes Recent Labs  Lab 10/24/17 1155 10/24/17 1939  AST 58* 46*  ALT 26 23  ALKPHOS 110 76  BILITOT 1.2 1.8*  ALBUMIN 3.7 2.5*    Cardiac Enzymes Recent Labs  Lab 10/22/17 1725 10/24/17 1939  TROPONINI <0.03 0.04*    Glucose Recent Labs  Lab 10/28/17 1123 10/28/17 1530 10/28/17 1959 10/28/17 2333 10/29/17 0403 10/29/17 0808  GLUCAP 106* 84 99 89 102* 103*    Imaging No  results found. STUDIES:  CXR 12/18 > Chronic fibrotic changes 10/29/2017 chest x-ray is pending>>  CULTURES: BC: 12/15 > negative  ANTIBIOTICS: Zosyn 12/15 >>  SIGNIFICANT EVENTS: 12/15- OR: s/p colectomy and colostomy   LINES/TUBES: PIV 12/15: IJ TLC ETT 12/15>>> 10/28/2017  DISCUSSION: 76 year old male with extensive medical cormorbidities presents for severe abominable pain found to have ischemic sigmoid colon s/p colectomy with ostomy 12/15  ASSESSMENT / PLAN:  PULMONARY A: VDRF secondary to acute hypoxic respiratory failure secondary to ischemic bowel Hx of severe COPD Past scans noted with pulmonary fibrosis, 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 Check chest x-ray and ABG for completeness.  His multiple comorbidities poor health status may require reintubation.  CARDIOVASCULAR A:  Shock secondary to hypovolemic and possible septic shock secondary to ischemic bowel  - off neosynephrine evening of 12/18 CAD Systolic CHF s/p AICD Recent VFIB s/p AICD firing PAF  Elevated troponin secondary to demand P:  Continue amio Holding eliquis for now. Defer to surgery on timing of restart - on heparin in the meantime Hold antihypertensives Diuresis as tolerated  RENAL Lab Results  Component Value Date   CREATININE 1.89 (H) 10/29/2017   CREATININE 1.66 (H) 10/28/2017   CREATININE 1.64 (H) 10/27/2017   Recent Labs  Lab 10/27/17 0426 10/28/17 0429 10/29/17 0318  K 4.4 4.1 4.2   Recent Labs  Lab 10/27/17 0426 10/28/17 0429 10/29/17 0318  NA 148* 150* 151*    Intake/Output Summary (Last 24 hours) at 10/29/2017 1151 Last data filed at 10/29/2017 0900 Gross per 24 hour  Intake 752.4 ml  Output 2870 ml  Net -2117.6 ml   A:   Acute renal failure secondary to pre-renal (hypovolemic) vs renal Anion gap metabolic acidosis secondary to lactic acidosis --> resolved,  but now hyperchloremic acidosis 12/19 Hyperkalemia- resolved Hypernatremia P:   Monitor urine output and Cr D/c saline Follow BMP Once core tract placed will need free water  GASTROINTESTINAL A:  Acute ischemic sigmoid colon s/p colectomy and ostomy 12/15 GERD P:   Continue TF's Monitor ostomy output Pain control Protonix GI prophylaxis Management per surgery Will need a court tract is unable to pass swallow eval 10/29/2017  HEMATOLOGIC Recent Labs    10/28/17 0429 10/29/17 0318  HGB 9.2* 9.2*    A:   Anemia secondary to acute blood loss  Thrombocytopenia secondary to consumption in setting of acute abdominal pathology and sepsis P:  Transfuse for Hgb < 7 SCD Follow CBC  Platelets 122 down from 140.   INFECTIOUS A:   Possible septic shock from ischemic bowel Leukocytosis secondary to sepsis  and reactive  P:   Continue zosyn  Follow PCT ordered 10/29/2017 Recheck lactic acid 10/30/2007  ENDOCRINE A:   No active issue  P:   Monitor glucose on chem   NEUROLOGIC A:   Sedation due to mechanical ventilation P:   Extubated 10/28/2017 Hold all sedation.    10/29/2017 daughter updated at bedside.  Patient is confused.   Brett CanalesSteve Mansi Tokar ACNP [facility] P[facility] Pager [phone] till 1 pm If no answer page [phone] 10/29/2017, 11:18 AM

## 2017-10-29 NOTE — Progress Notes (Signed)
Nutrition Follow-up  DOCUMENTATION CODES:   Non-severe (moderate) malnutrition in context of chronic illness  INTERVENTION:    If unable to begin PO diet within next 24 hours, recommend place Cortrak tube (available M-W-F-Sat) and resume TF with Vital AF 1.2 at 70 ml/h (1680 ml per day) to provide 2016 kcal, 126 gm protein, 1362 ml free water daily  NUTRITION DIAGNOSIS:   Moderate Malnutrition related to chronic illness(COPD, CAD) as evidenced by mild fat depletion, mild muscle depletion.  Ongoing  GOAL:   Patient will meet greater than or equal to 90% of their needs  Unmet  MONITOR:   Diet advancement, I & O's, Skin  ASSESSMENT:   76 yo male with PMH of HTN, HLD, MI, COPD, GERD, PVD, PUD, AICD, HOH, cardiomyopathy, CAD, PAF who was admitted on 12/15 with ischemic sigmoid colon. S/P hernia repair, colectomy, & colostomy on 12/15.  Patient was extubated on 12/19. NGT out, no enteral access available. Remains NPO per SLP due to severe aspiration risk. Labs and medications reviewed.  Diet Order:  Diet NPO time specified  EDUCATION NEEDS:   No education needs have been identified at this time  Skin:  Skin Assessment: Skin Integrity Issues: Skin Integrity Issues:: Incisions Incisions: abdomen  Last BM:  12/19, colostomy  Height:   Ht Readings from Last 1 Encounters:  10/24/17 5\' 11"  (1.803 m)    Weight:   Wt Readings from Last 1 Encounters:  10/29/17 156 lb 8.4 oz (71 kg)    Ideal Body Weight:  78.2 kg  BMI:  Body mass index is 21.83 kg/m.  Estimated Nutritional Needs:   Kcal:  2000-2200  Protein:  100-120 gm  Fluid:  2-2.2 L   Joaquin CourtsKimberly Andretta Ergle, RD, LDN, CNSC Pager [phone] After Hours Pager [phone]

## 2017-10-29 NOTE — Progress Notes (Signed)
ANTICOAGULATION CONSULT NOTE - Follow Up Consult  Pharmacy Consult for heparin Indication: atrial fibrillation  Labs: Recent Labs    10/27/17 0426 10/27/17 1936 10/28/17 0429 10/28/17 0857 10/29/17 0318  HGB 9.0*  --  9.2*  --  9.2*  HCT 28.5*  --  29.8*  --  30.9*  PLT 139*  --  142*  --  122*  HEPARINUNFRC  --  0.40  --  0.29* 0.27*  CREATININE 1.64*  --  1.66*  --   --     Assessment: 76yo male subtherapeutic on heparin with lower level despite increased rate yesterday; no gtt issues overnight per RN.   Goal of Therapy:  Heparin level 0.3-0.7 units/ml   Plan:  Will increase heparin gtt by 2 units/kg/hr to 1200 units/hr and check level in 8 hours.   Vernard GamblesVeronda Jelena Malicoat, PharmD, BCPS  10/29/2017,5:04 AM

## 2017-10-29 NOTE — Evaluation (Signed)
Clinical/Bedside Swallow Evaluation Patient Details  Name: Stephen Cohen Man MRN: [id number] Date of Birth: 1941-03-27  Today's Date: 10/29/2017 Time: SLP Start Time (ACUTE ONLY): [id number] SLP Stop Time (ACUTE ONLY): 0917 SLP Time Calculation (min) (ACUTE ONLY): 13 min  Past Medical History:  Past Medical History:  Diagnosis Date  . Arthritis   . Automatic implantable cardioverter-defibrillator in situ   . CAD in native artery 01/08/2016   Overview:  Cardiac cath 03/17/16: Conclusions Diagnostic Procedure Summary Severe global LV dysfunction. EF 35%, EDP=175mm Hg. Native RCA, LAD and Cx occluded. LIMA to LAD patent. SVG to RCA clear. Jump SVG to OM1 and OM2 patent. Has proximal stent, 25% narrowing. Diagnostic Procedure Recommendations Medical Therapy because no interventional therapy is required.  . Cataracts, bilateral   . COPD (chronic obstructive pulmonary disease) (HCC)   . Essential hypertension 01/08/2016  . GERD (gastroesophageal reflux disease)   . Hiatal hernia   . HOH (hard of hearing)   . Hyperlipidemia   . Hypertension   . Ischemic dilated cardiomyopathy (HCC) 06/07/2015  . Myocardial infarction (HCC) 1995  . PAF (paroxysmal atrial fibrillation) (HCC) 01/08/2016  . Peptic ulcer disease   . Peripheral vascular disease (HCC)   . VF (ventricular fibrillation) (HCC)    a. appropriate ICD therapy 12/18   Past Surgical History:  Past Surgical History:  Procedure Laterality Date  . APPENDECTOMY    . BOWEL RESECTION N/A 10/24/2017   Procedure: SMALL BOWEL RESECTION;  Surgeon: Almond LintByerly, Faera, MD;  Location: [facility] OR;  Service: General;  Laterality: N/A;  . CARDIAC DEFIBRILLATOR PLACEMENT  02/01/2013   [employer]  . COLONOSCOPY    . COLOSTOMY  10/24/2017   Procedure: COLOSTOMY;  Surgeon: Almond LintByerly, Faera, MD;  Location: [facility] OR;  Service: General;;  . CORONARY ANGIOPLASTY     2 stents in 2014.  Marland Kitchen. CORONARY ARTERY BYPASS GRAFT  1995  . FEMORAL-FEMORAL BYPASS GRAFT N/A 12/12/2013   Procedure:  BYPASS GRAFT FEMORAL-FEMORAL ARTERY-  RIGHT TO LEFT using rifampin soaked hemashield graft;  Surgeon: Larina Earthlyodd F Early, MD;  [facility];  Service: Vascular;  Laterality: N/A;  . FEMORAL-FEMORAL BYPASS GRAFT Bilateral 07/03/2014   Procedure: REVISION OF Right FEMORAL to Left FEMORAL ARTERY BYPASS GRAFT with removal of Eroded graft;  Surgeon: Larina Earthlyodd F Early, MD;  [facility];  Service: Vascular;  Laterality: Bilateral;  . INGUINAL HERNIA REPAIR N/A 10/24/2017   Procedure: HERNIA REPAIR INGUINAL ADULT;  Surgeon: Almond LintByerly, Faera, MD;  [facility];  Service: General;  Laterality: N/A;  . LAPAROSCOPY N/A 10/24/2017   Procedure: LAPAROSCOPY DIAGNOSTIC;  Surgeon: Almond LintByerly, Faera, MD;  Location: [facility] OR;  Service: General;  Laterality: N/A;  . LAPAROTOMY N/A 10/24/2017   Procedure: EXPLORATORY LAPAROTOMY;  Surgeon: Almond LintByerly, Faera, MD;  Location: [facility] OR;  Service: General;  Laterality: N/A;  . LEFT HEART CATHETERIZATION WITH CORONARY ANGIOGRAM N/A 11/11/2012   Procedure: LEFT HEART CATHETERIZATION WITH CORONARY ANGIOGRAM;  Surgeon: Pamella PertJagadeesh Cohen Ganji, MD;  [facility];  Service: Cardiovascular;  Laterality: N/A;  . PERCUTANEOUS CORONARY STENT INTERVENTION (PCI-S)  11/11/2012   Procedure: PERCUTANEOUS CORONARY STENT INTERVENTION (PCI-S);  Surgeon: Pamella PertJagadeesh Cohen Ganji, MD;  [facility];  Service: Cardiovascular;;  . PR VEIN BYPASS GRAFT,AORTO-FEM-POP  1995  2005   Right to left Fem-Fem with revision   . REMOVAL OF GRAFT Bilateral 12/12/2013   Procedure: REMOVAL OF Femoral- femoral hemashield GRAFT;  Surgeon: Larina Earthlyodd F Early, MD;  [facility];  Service: Vascular;  Laterality:  Bilateral;   HPI:  Pt is a 76 yo male with PMH of HTN, HLD, MI, COPD, GERD, PVD, PUD, AICD, HOH, cardiomyopathy, CAD, and PAF, who was admitted on 12/15 with ischemic sigmoid colon, now s/p hernia repair, colectomy, & colostomy on 12/15.He was intuabted 12/15-12/19.   Assessment / Plan / Recommendation Clinical Impression   Pt is at an increased risk for aspiration given prolonged intubation, current respiratory status, and generalized deconditioning. He has occasional coughing a baseline that is weak and congested, but he also consistently coughs immediately after swallowing trials of ice. His SpO2 with his ventimask on hovers from the high 80s to the low 90s. Recommend that he remain NPO for now, but with good prognosis for return to POs with additional time post-extubation and with return of overall strength. SLP Visit Diagnosis: Dysphagia, unspecified (R13.10)    Aspiration Risk  Severe aspiration risk    Diet Recommendation NPO;Alternative means - temporary   Medication Administration: Via alternative means    Other  Recommendations Oral Care Recommendations: Oral care QID Other Recommendations: Have oral suction available   Follow up Recommendations (tba)      Frequency and Duration min 3x week  2 weeks       Prognosis Prognosis for Safe Diet Advancement: Good      Swallow Study   General HPI: Pt is a 76 yo male with PMH of HTN, HLD, MI, COPD, GERD, PVD, PUD, AICD, HOH, cardiomyopathy, CAD, and PAF, who was admitted on 12/15 with ischemic sigmoid colon, now s/p hernia repair, colectomy, & colostomy on 12/15.He was intuabted 12/15-12/19. Type of Study: Bedside Swallow Evaluation Previous Swallow Assessment: none in chart Diet Prior to this Study: NPO Temperature Spikes Noted: Yes(101) Respiratory Status: Venti-mask History of Recent Intubation: Yes Length of Intubations (days): 5 days Date extubated: 10/28/17 Behavior/Cognition: Alert;Cooperative;Other (Comment)(HOH) Oral Cavity Assessment: Dry Oral Care Completed by SLP: Yes Oral Cavity - Dentition: Edentulous;Dentures, not available Self-Feeding Abilities: Needs assist Patient Positioning: Upright in bed Baseline Vocal Quality: Hoarse Volitional Cough: Weak;Congested Volitional Swallow: Able to elicit    Oral/Motor/Sensory Function  Overall Oral Motor/Sensory Function: Generalized oral weakness   Ice Chips Ice chips: Impaired Presentation: Spoon Pharyngeal Phase Impairments: Cough - Immediate   Thin Liquid Thin Liquid: Not tested    Nectar Thick Nectar Thick Liquid: Not tested   Honey Thick Honey Thick Liquid: Not tested   Puree Puree: Not tested   Solid   GO   Solid: Not tested        Maxcine Hamaiewonsky, Kaytelynn Scripter 10/29/2017,9:28 AM  Maxcine HamLaura Paiewonsky, M.A. CCC-SLP [phone]

## 2017-10-29 NOTE — Plan of Care (Addendum)
  Interdisciplinary Goals of Care Family Meeting   Date carried out:: 10/29/2017  Location of the meeting: Bedside  Member's involved: Physician, Bedside Registered Nurse and Family Member or next of kin  Durable Power of Attorney or acting medical decision maker: Wife and grandaughter    Discussion: We discussed goals of care for [name] .  We discussed the fact that Stephen Cohen is having problems with his breathing and increased delirium and confusion.  He may worsen to the point of needing reintubation.  With his chronic lung issues including severe COPD and severe fibrosis which likely he will not be able to be liberated from the ventilator.  The family has made it clear that they do not want a trach or PEG nature.  We have decided to change code status to DNR.  Code status: Full DNR  Disposition: Continue current acute care  Time spent for the meeting: 20 mins  [name] 10/29/2017, 1:39 PM

## 2017-10-29 NOTE — Progress Notes (Signed)
ANTICOAGULATION CONSULT NOTE   Pharmacy Consult for heparin Indication: atrial fibrillation  No Known Allergies  Patient Measurements: Height: 5\' 11"  (180.3 cm) Weight: 156 lb 8.4 oz (71 kg) IBW/kg (Calculated) : 75.3 Heparin Dosing Weight: 64 kg  Vital Signs: Temp: 98.7 F (37.1 C) (12/20 1900) Temp Source: Oral (12/20 1900) BP: 104/51 (12/20 2100) Pulse Rate: 61 (12/20 2100)  Labs: Recent Labs    10/27/17 0426  10/28/17 0429  10/29/17 0318 10/29/17 1200 10/29/17 2000  HGB 9.0*  --  9.2*  --  9.2*  --   --   HCT 28.5*  --  29.8*  --  30.9*  --   --   PLT 139*  --  142*  --  122*  --   --   HEPARINUNFRC  --    < >  --    < > 0.27* 0.26* 0.17*  CREATININE 1.64*  --  1.66*  --  1.89*  --   --    < > = values in this interval not displayed.    Estimated Creatinine Clearance: 33.4 mL/min (A) (by C-G formula based on SCr of 1.89 mg/dL (H)).  Assessment: Patient continues on heparin for Afib. CHADS2VASC 4. On apixaban PTA (last dose 12/14) and pt w/ ischemic sigmoid colon s/p OR 12/15.  Heparin rate was increased thid morning and repeat level is even lower at 0.17units/mL. No issues with line, infusion not turned off per RN Jennifer's knowledge. No bleeding/oozing noted.  Goal of Therapy:  Heparin level 0.3-0.7 units/ml Monitor platelets by anticoagulation protocol: Yes   Plan:  Increase hep to 1500 units/hr Daily heparin level and CBC  Parry Po D. Kamarri Fischetti, PharmD, BCPS Clinical Pharmacist [phone] 10/29/2017 9:32 PM

## 2017-10-29 NOTE — Consult Note (Addendum)
WOC Nurse ostomy consult note Stoma type/location: Colostomy surgery performed on 12/15. Surgical team following for assessment and plan of care for abd wound.  Stomal assessment/size: There is a slight patch of pink showing through the slough when the pouch was changed today; 2% red, 98% brown slough, slightly above skin level, 1 1/2 inches  Peristomal assessment: There are blisters to left abd near the pouch tape boarder, none on the other areas surrounding the pouch.  Affected area is 3X1.5X.1cm, some have ruptured and evolved into pink moist partial thickness skin loss; appearance is consistent with medical adhesive related skin injury. Output: small amt brown liquid stool in the pouch Ostomy pouching: 1 piece  Education provided: Applied barrier ring to assist with maintaining a seal, and one piece pouch.  Foam dressing to left abd to protect and promote skin where medical adhesive related skin injury has occurred.  Will plan to begin teaching sessions when stable and out of ICU. No family present and pt does not appear to understand when pouch change was demonstrated. Supplies at the bedside for staff nurses use. Enrolled [facility] in [facility] program: No Cammie Mcgeeawn Mollye Guinta MSN, RN, [place], [place], [place] [phone]

## 2017-10-29 NOTE — Progress Notes (Signed)
ANTICOAGULATION CONSULT NOTE   Pharmacy Consult for heparin Indication: atrial fibrillation  No Known Allergies  Patient Measurements: Height: 5\' 11"  (180.3 cm) Weight: 156 lb 8.4 oz (71 kg) IBW/kg (Calculated) : 75.3 Heparin Dosing Weight: 64 kg  Vital Signs: Temp: 99.1 F (37.3 C) (12/20 1133) Temp Source: Oral (12/20 1133) BP: 106/75 (12/20 0900) Pulse Rate: 94 (12/20 0900)  Labs: Recent Labs    10/27/17 0426  10/28/17 0429 10/28/17 0857 10/29/17 0318 10/29/17 1200  HGB 9.0*  --  9.2*  --  9.2*  --   HCT 28.5*  --  29.8*  --  30.9*  --   PLT 139*  --  142*  --  122*  --   HEPARINUNFRC  --    < >  --  0.29* 0.27* 0.26*  CREATININE 1.64*  --  1.66*  --  1.89*  --    < > = values in this interval not displayed.    Estimated Creatinine Clearance: 33.4 mL/min (A) (by C-G formula based on SCr of 1.89 mg/dL (H)).  Assessment: CC/HPI: abdominal pain, n/v/d, sats 76%>placed on NRB - pt's family states pt always hypotensive, hypoxic.    PMH: CHF, EF 35%, CAD, PAD, PAF, HLD, HTN, CABG 99, stents 014, COPD, +tobacco, ICD, noncompliance, arthritis, cataracts, GERD, HH, HOH, pacer,   Anticoag: Afib. CHADS2VASC 4. On apixaban PTA (last dose 12/14) and pt w/ ischemic sigmoid colon s/p OR 12/15 AM hep lvl slightly low at 0.26 on 1200 units/hr  Renal: SCr 1.89  Heme/Onc: H&H 9.2/30.9, Plt 122  Goal of Therapy:  Heparin level 0.3-0.7 units/ml Monitor platelets by anticoagulation protocol: Yes   Plan:  Increase hep to 1350 units/hr 2000 hep lvl Daily HL, CBC  Isaac BlissMichael Anmol Paschen, PharmD, BCPS, BCCCP Clinical Pharmacist Clinical phone for 10/29/2017 from 7a-3:30p: [phone] If after 3:30p, please call main pharmacy at: [phone] 10/29/2017 12:41 PM

## 2017-10-29 NOTE — Progress Notes (Signed)
CCS/Adelina Collard Progress Note 5 Days Post-Op  Subjective: Patient a bit agitated thei AM, but will briskly follow commands.    Objective: Vital signs in last 24 hours: Temp:  [97.6 F (36.4 C)-101 F (38.3 C)] 97.6 F (36.4 C) (12/20 0403) Pulse Rate:  [43-130] 84 (12/20 0700) Resp:  [11-31] 11 (12/20 0700) BP: (91-146)/(51-101) 121/57 (12/20 0700) SpO2:  [64 %-100 %] 93 % (12/20 0700) FiO2 (%):  [30 %] 30 % (12/19 1027) Weight:  [71 kg (156 lb 8.4 oz)] 71 kg (156 lb 8.4 oz) (12/20 0336) Last BM Date: 10/28/17  Intake/Output from previous day: 12/19 0701 - 12/20 0700 In: 822.8 [I.V.:692.8; NG/GT:80; IV Piggyback:50] Out: 2995 [Urine:2985; Stool:10] Intake/Output this shift: No intake/output data recorded.  General: Agitated, no apparent reason.  Says that his mouth is dry.    Lungs: Sounds wet bilaterally.  Oxygen saturation all over the place between 87% to 96%.    Abd: Midline wound is okay.  No infection.  Stoma still dark and sloughing.  Minimal to no output from stoma  Extremities: No chagnes  Neuro: Agitated, follows commands.    Lab Results:  @LABLAST2 (wbc:2,hgb:2,hct:2,plt:2) BMET ) Recent Labs    10/28/17 0429 10/29/17 0318  NA 150* 151*  K 4.1 4.2  CL 118* 119*  CO2 20* 22  GLUCOSE 84 104*  BUN 31* 35*  CREATININE 1.66* 1.89*  CALCIUM 8.0* 8.1*   PT/INR No results for input(s): LABPROT, INR in the last 72 hours. ABG Recent Labs    10/27/17 1055  PHART 7.389  HCO3 19.6*    Studies/Results: Dg Chest Port 1 View  Result Date: 10/28/2017 CLINICAL DATA:  Acute respiratory failure EXAM: PORTABLE CHEST 1 VIEW COMPARISON:  10/27/2017 FINDINGS: Endotracheal tube in good position. NG tube enters stomach with the tip not visualized. Central venous catheter tip SVC. AICD unchanged in position. Prominent interstitial lung markings are stable mostly due to chronic lung disease and fibrosis. Progression of bibasilar atelectasis since the prior study.  IMPRESSION: Support lines remain in good position Diffuse pulmonary fibrosis with progression of bibasilar atelectasis. Electronically Signed   By: Marlan Palauharles  Clark M.D.   On: 10/28/2017 06:49    Anti-infectives: Anti-infectives (From admission, onward)   Start     Dose/Rate Route Frequency Ordered Stop   10/25/17 1500  piperacillin-tazobactam (ZOSYN) IVPB 3.375 g     3.375 g 12.5 mL/hr over 240 Minutes Intravenous Every 8 hours 10/25/17 1456     10/24/17 2100  piperacillin-tazobactam (ZOSYN) IVPB 2.25 g  Status:  Discontinued     2.25 g 100 mL/hr over 30 Minutes Intravenous Every 6 hours 10/24/17 1949 10/25/17 1456   10/24/17 1945  piperacillin-tazobactam (ZOSYN) IVPB 3.375 g  Status:  Discontinued     3.375 g 12.5 mL/hr over 240 Minutes Intravenous Every 8 hours 10/24/17 1933 10/24/17 1948   10/24/17 1245  piperacillin-tazobactam (ZOSYN) IVPB 3.375 g     3.375 g 100 mL/hr over 30 Minutes Intravenous  Once 10/24/17 1242 10/24/17 1341      Assessment/Plan: s/p Procedure(s): HERNIA REPAIR INGUINAL ADULT EXPLORATORY LAPAROTOMY SMALL BOWEL RESECTION LAPAROSCOPY DIAGNOSTIC COLOSTOMY Continue ABX therapy due to Post-op infection  Swallowing evaluation today. If patient does not pass swallowing evaluation ,will get Cortrak tube placed and start tube feedings at trickle rate--he still has not put out much from his colostomy yet.  LOS: 5 days   Marta LamasJames O. Gae BonWyatt, III, MD, FACS [phone]--pager [phone]--office [facility] 10/29/2017

## 2017-10-30 DIAGNOSIS — J9600 Acute respiratory failure, unspecified whether with hypoxia or hypercapnia: Secondary | ICD-10-CM

## 2017-10-30 DIAGNOSIS — L8990 Pressure ulcer of unspecified site, unspecified stage: Secondary | ICD-10-CM

## 2017-10-30 LAB — GLUCOSE, CAPILLARY
GLUCOSE-CAPILLARY: 136 mg/dL — AB (ref 65–99)
Glucose-Capillary: 132 mg/dL — ABNORMAL HIGH (ref 65–99)
Glucose-Capillary: 137 mg/dL — ABNORMAL HIGH (ref 65–99)

## 2017-10-30 LAB — CBC
HCT: 33.1 % — ABNORMAL LOW (ref 39.0–52.0)
HEMOGLOBIN: 10 g/dL — AB (ref 13.0–17.0)
MCH: 29.8 pg (ref 26.0–34.0)
MCHC: 30.2 g/dL (ref 30.0–36.0)
MCV: 98.5 fL (ref 78.0–100.0)
Platelets: 129 10*3/uL — ABNORMAL LOW (ref 150–400)
RBC: 3.36 MIL/uL — ABNORMAL LOW (ref 4.22–5.81)
RDW: 15.3 % (ref 11.5–15.5)
WBC: 9.6 10*3/uL (ref 4.0–10.5)

## 2017-10-30 LAB — PROCALCITONIN: Procalcitonin: 5.12 ng/mL

## 2017-10-30 LAB — HEPARIN LEVEL (UNFRACTIONATED)
HEPARIN UNFRACTIONATED: 0.3 [IU]/mL (ref 0.30–0.70)
Heparin Unfractionated: 0.33 IU/mL (ref 0.30–0.70)

## 2017-10-30 MED ORDER — METHYLPREDNISOLONE SODIUM SUCC 125 MG IJ SOLR
60.0000 mg | Freq: Two times a day (BID) | INTRAMUSCULAR | Status: DC
  Filled 2017-10-30: qty 0.96

## 2017-10-30 MED ORDER — FREE WATER
200.0000 mL | Freq: Three times a day (TID) | Status: DC
  Administered 2017-10-30: 200 mL

## 2017-10-30 MED ORDER — PANTOPRAZOLE SODIUM 40 MG IV SOLR
40.0000 mg | Freq: Two times a day (BID) | INTRAVENOUS | Status: DC
  Administered 2017-10-30: 40 mg via INTRAVENOUS
  Filled 2017-10-30: qty 40

## 2017-10-30 MED ORDER — MORPHINE SULFATE (PF) 2 MG/ML IV SOLN
2.0000 mg | INTRAVENOUS | Status: DC | PRN
  Administered 2017-10-30: 2 mg via INTRAVENOUS
  Filled 2017-10-30: qty 1

## 2017-10-30 MED ORDER — VITAL AF 1.2 CAL PO LIQD
1000.0000 mL | ORAL | Status: DC
  Administered 2017-10-30: 1000 mL

## 2017-10-30 MED ORDER — DEXMEDETOMIDINE HCL IN NACL 400 MCG/100ML IV SOLN
0.4000 ug/kg/h | INTRAVENOUS | Status: DC
  Administered 2017-10-30: 0.3 ug/kg/h via INTRAVENOUS
  Filled 2017-10-30: qty 100

## 2017-10-30 NOTE — Progress Notes (Signed)
CSW called [facility] and checked the status of transportation. CSW was informed that pt is still on the list and was unable to give an estimation of time pick up.   Montine CircleKelsy Janesa Dockery, Silverio LayLCSWA [facility]  [phone]

## 2017-10-30 NOTE — Progress Notes (Addendum)
I met again with the patient and granddaughter. Stephen Cohen is more lucid today afternoon. He clearly stated that he wants to go to hospice and wants all interventions including tube feeds stopped. The granddaughter and family are supportive of his decision. He has been referred to [facility] who are willing to take him today We will stop all infusions and remove the feeding tube. Add morphine PRN for comfort.  Discussed with Dr. Hulen Skains, Surgery who will facilitate the discharge.  Stephen Garfinkel MD [facility] Pager [phone] If no answer or after 3pm call: [phone] 10/30/2017, 2:16 PM

## 2017-10-30 NOTE — Progress Notes (Signed)
Palliative Medicine RN Note: Rec'd consult from Dr Isaiah SergeMannam. Goals are set per his conversation with the family, and they would [facility]. There are currently no other unmet palliative needs. I placed SW order for placement and will call the unit SW to update. Referral for PMT will be cancelled. Please re-consult if new needs arise.  Stephen ChanceMelanie G. Sheva Mcdougle, RN, BSN, [facility] 10/30/2017 11:17 AM Office [phone]

## 2017-10-30 NOTE — Progress Notes (Signed)
ANTICOAGULATION CONSULT NOTE   Pharmacy Consult for Heparin Indication: atrial fibrillation  No Known Allergies  Patient Measurements: Height: 5\' 11"  (180.3 cm) Weight: 156 lb 1.4 oz (70.8 kg) IBW/kg (Calculated) : 75.3 Heparin Dosing Weight: 64 kg  Vital Signs: Temp: 99.2 F (37.3 C) (12/20 2347) Temp Source: Axillary (12/20 2347) BP: 111/55 (12/21 0300) Pulse Rate: 32 (12/21 0300)  Labs: Recent Labs    10/27/17 0426  10/28/17 0429  10/29/17 0318 10/29/17 1200 10/29/17 2000 10/30/17 0257  HGB 9.0*  --  9.2*  --  9.2*  --   --  10.0*  HCT 28.5*  --  29.8*  --  30.9*  --   --  33.1*  PLT 139*  --  142*  --  122*  --   --  129*  HEPARINUNFRC  --    < >  --    < > 0.27* 0.26* 0.17* 0.33  CREATININE 1.64*  --  1.66*  --  1.89*  --   --   --    < > = values in this interval not displayed.    Estimated Creatinine Clearance: 33.3 mL/min (A) (by C-G formula based on SCr of 1.89 mg/dL (H)).  Assessment: Patient continues on heparin for Afib. CHADS2VASC 4. On apixaban PTA (last dose 12/14) and pt w/ ischemic sigmoid colon s/p OR 12/15.  Heparin level therapeutic x 1 after rate increase  Goal of Therapy:  Heparin level 0.3-0.7 units/ml Monitor platelets by anticoagulation protocol: Yes   Plan:  Cont hep at 1500 units/hr 1200 HL Daily heparin level and CBC  Abran DukeJames Jachin Coury, PharmD, BCPS Clinical Pharmacist Phone: [phone]

## 2017-10-30 NOTE — Discharge Summary (Signed)
Physician Discharge Summary  Patient ID: Stephen Cohen MRN: [id number] DOB/AGE: [birth year] 76 y.o.  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, infarction and necrosis  Discharge Diagnoses:  Active Problems:   Incarcerated left inguinal hernia   Hernia, inguinal, recurrent, with obstruction   Ventilator dependent (HCC)   Metabolic acidosis with increased anion gap and accumulation of organic acids   Septic shock (HCC)   Acute renal failure with tubular necrosis (HCC)   Hyperkalemia   Malnutrition of moderate degree   Pressure injury of skin   Acute respiratory failure (HCC) History of Significant CAD, congestive heart failure, significant lung disease/COPD AICD in place  Discharged Condition: critical  Hospital Course: Patient with many medical co-morbidities who came to the ED with an incarcerated left inguinal hernia that unfortunately caused his colon to become entrapped and become necrotic.  He required emergency surgery with a partial colectomy and a colostomy.  He was intubated for several days after surgery, extubated two days ago and has struggled since that time.  The patient has requested that all support be withdrawn and he is of sound mind.  He does not want to go to a SNF where he would certainly need to go after this hospitalization.  He would not be independent.  With his multiple medical co-morbidities he likely has < 6 months to live.  He will be transfers to a hospice facility where he will get comfort care.  Consults: pulmonary/intensive care and general surgery  Significant Diagnostic Studies: labs: cbc/cmet and radiology: CXR: CHF and COPD and CT scan: incarcerated hernia  Treatments: IV hydration, antibiotics: vancomycin and Zosyn and surgery: Colectomy/colostomy/inguinal hernia repair  Discharge Exam: Blood pressure (!) 112/59, pulse 82, temperature 98.3 F (36.8 C),  temperature source Oral, resp. rate 17, height 5\' 11"  (1.803 m), weight 70.8 kg (156 lb 1.4 oz), SpO2 90 %. General appearance: alert, cachectic and moderate distress Resp: diminished breath sounds bilaterally, rales bilaterally and rhonchi bilaterally Cardio: paced GI: abnormal findings:  hypoactive bowel sounds and open midline woudn with wet to dry dressing.  Partially necrotic left sided colostomy and flat and now getting tube feedings and not tolerating them well.  Disposition: 01-Home or Self Care   Allergies as of 10/30/2017   No Known Allergies     Medication List    STOP taking these medications   acetaminophen 500 MG tablet Commonly known as:  TYLENOL   amiodarone 200 MG tablet Commonly known as:  PACERONE   ANORO ELLIPTA 62.5-25 MCG/INH Aepb Generic drug:  umeclidinium-vilanterol   apixaban 5 MG Tabs tablet Commonly known as:  ELIQUIS   aspirin EC 81 MG tablet   atorvastatin 10 MG tablet Commonly known as:  LIPITOR   carvedilol 3.125 MG tablet Commonly known as:  COREG   diphenhydrAMINE 25 mg capsule Commonly known as:  BENADRYL   esomeprazole 20 MG capsule Commonly known as:  NEXIUM   isosorbide mononitrate 60 MG 24 hr tablet Commonly known as:  IMDUR   LEG CRAMP RELIEF Tabs   lisinopril 5 MG tablet Commonly known as:  PRINIVIL,ZESTRIL   LOPERAMIDE A-D 2 MG tablet Generic drug:  loperamide   nitroGLYCERIN 0.4 MG SL tablet Commonly known as:  NITROSTAT   OCUVITE ADULT 50+ PO   POTASSIUM GLUCONATE PO   PROAIR HFA 108 (90 Base) MCG/ACT inhaler Generic drug:  albuterol   spironolactone 25 MG tablet Commonly known as:  ALDACTONE  torsemide 20 MG tablet Commonly known as:  DEMADEX        Signed: Jimmye NormanJames Rufus Cypert 10/30/2017, 3:02 PM

## 2017-10-30 NOTE — Progress Notes (Signed)
[facility]. CSW was informed that first Sherron MondayHillary needs a discharge summary from Dr. Lindie SpruceWyatt stating that "pt is going to inpatient hospice. Pt has diagnosis of CAD, COPD, and CHF as well as pt has a prognosis of 6 months or less" before they can take pt today. CSW has paged Dr. Lindie SpruceWyatt at [phone]) to inform him of this request. CSW will continue to follow.     Claude MangesKierra S. Rodriguez Aguinaldo, MSW, LCSW-A Emergency Department Clinical Social Worker [phone]

## 2017-10-30 NOTE — Progress Notes (Signed)
CCS/Shaun Runyon Progress Note 6 Days Post-Op  Subjective: Patient actually following commands and less agitated on Precedex this AM.  No acute distress.  Objective: Vital signs in last 24 hours: Temp:  [97.6 F (36.4 C)-100.8 F (38.2 C)] 97.6 F (36.4 C) (12/21 0744) Pulse Rate:  [32-110] 52 (12/21 0700) Resp:  [16-21] 17 (12/21 0700) BP: (102-128)/(49-84) 121/62 (12/21 0700) SpO2:  [89 %-98 %] 96 % (12/21 0700) FiO2 (%):  [55 %] 55 % (12/21 0700) Weight:  [70.8 kg (156 lb 1.4 oz)] 70.8 kg (156 lb 1.4 oz) (12/21 0316) Last BM Date: 10/29/17  Intake/Output from previous day: 12/20 0701 - 12/21 0700 In: 998.3 [I.V.:885.8; IV Piggyback:112.5] Out: 2220 [Urine:2220] Intake/Output this shift: No intake/output data recorded.  General: Only mildly agitated.    Lungs: Clear.  Oxygenating at 93% on NRB mask  Abd: Soft, good bowel sounds.  Ostomy looks better.  Extremities: No changes  Neuro: Seems to be more oriented.  Lab Results:  @LABLAST2 (wbc:2,hgb:2,hct:2,plt:2) BMET ) Recent Labs    10/28/17 0429 10/29/17 0318  NA 150* 151*  K 4.1 4.2  CL 118* 119*  CO2 20* 22  GLUCOSE 84 104*  BUN 31* 35*  CREATININE 1.66* 1.89*  CALCIUM 8.0* 8.1*   PT/INR No results for input(s): LABPROT, INR in the last 72 hours. ABG Recent Labs    10/27/17 1055 10/29/17 1127  PHART 7.389 7.320*  HCO3 19.6* 21.7    Studies/Results: Dg Chest Port 1 View  Result Date: 10/29/2017 CLINICAL DATA:  Respiratory failure.  COPD. EXAM: PORTABLE CHEST 1 VIEW COMPARISON:  10/28/2017. FINDINGS: Cardiomegaly. Pattern of diffuse pulmonary fibrosis appears more pronounced, and there may be superimposed pulmonary edema. Small BILATERAL effusions. No consolidation or lobar atelectasis. Center venous catheter tip unchanged. Endotracheal tube has been removed. IMPRESSION: The patient has been extubated. Slight worsening aeration. Increasing interstitial prominence may represent pulmonary edema superimposed  on diffuse fibrosis. Electronically Signed   By: Elsie StainJohn T Curnes M.D.   On: 10/29/2017 11:53    Anti-infectives: Anti-infectives (From admission, onward)   Start     Dose/Rate Route Frequency Ordered Stop   10/25/17 1500  piperacillin-tazobactam (ZOSYN) IVPB 3.375 g     3.375 g 12.5 mL/hr over 240 Minutes Intravenous Every 8 hours 10/25/17 1456     10/24/17 2100  piperacillin-tazobactam (ZOSYN) IVPB 2.25 g  Status:  Discontinued     2.25 g 100 mL/hr over 30 Minutes Intravenous Every 6 hours 10/24/17 1949 10/25/17 1456   10/24/17 1945  piperacillin-tazobactam (ZOSYN) IVPB 3.375 g  Status:  Discontinued     3.375 g 12.5 mL/hr over 240 Minutes Intravenous Every 8 hours 10/24/17 1933 10/24/17 1948   10/24/17 1245  piperacillin-tazobactam (ZOSYN) IVPB 3.375 g     3.375 g 100 mL/hr over 30 Minutes Intravenous  Once 10/24/17 1242 10/24/17 1341      Assessment/Plan: s/p Procedure(s): HERNIA REPAIR INGUINAL ADULT EXPLORATORY LAPAROTOMY SMALL BOWEL RESECTION LAPAROSCOPY DIAGNOSTIC COLOSTOMY Advance diet Cortrak and tube feedings.    Increase Protonix with the patient being on steroids, decrease steroids  LOS: 6 days   Marta LamasJames O. Gae BonWyatt, III, MD, FACS [phone]--pager [phone]--office [facility] 10/30/2017

## 2017-10-30 NOTE — Progress Notes (Signed)
Report was given to Hospice nurse rm [phone]. Pt VS stable. Cortrek and IV Lt and CVC removed. IV infusions stopped. Foley and RT IV left in place per Hospice. All documents are ready to go, including DNR form. VS stable. Pt's belongings with family. Family at the bedside. Waiting on transport

## 2017-10-30 NOTE — Progress Notes (Addendum)
PULMONARY / CRITICAL CARE MEDICINE   Name: Stephen Cohen MRN: [id number] DOB: April 10, 1941    ADMISSION DATE:  10/24/2017 CONSULTATION DATE:  10/24/2017  REFERRING MD:  Almond LintByerly, Faera  CHIEF COMPLAINT:  Ischemic bowel   HISTORY OF PRESENT ILLNESS:   76 year old male with past medical history of CAD, COPD, systolic CHF s/p AICD, HTN, HLD, PAF on eliquis, FEM-FEM bypass seen in the ED last week for VFIB with AICD firing presented on 12/15 with severe lower abdominal pain, N/V/D. Patient was hypotensive, hypoxic,  acute renal failure, hyperkalemic. Further workup showed possible incarcerated bowel. He was taken to the OR. No incarcerated bowel was seen but had ischemic sigmoid colon. Patient is now s/p colectomy with ostomy.   Patient was sent to the ICU intubated with an arterial line and central line. He was hypotensive, tachycardic, acidotic, hyperkalemic,. He received 2 L NS in the OR along with one dose of albumin and 1 unit PRBC. He received another 3-4 liters of LR in the ICU and was started on pressors and fentanyl ggt. He was also given multiple ampules of bicarb for his acidosis, D50 for hypoglycemia, and calcium gluconate for hyperkalemia.    Subjective:  Started precedex for agitation. More calm today  VITAL SIGNS: BP (!) 114/58   Pulse (!) 57   Temp 97.6 F (36.4 C) (Axillary)   Resp (!) 26   Ht 5\' 11"  (1.803 m)   Wt 156 lb 1.4 oz (70.8 kg)   SpO2 (!) 83%   BMI 21.77 kg/m   HEMODYNAMICS:    VENTILATOR SETTINGS: FiO2 (%):  [55 %] 55 %  INTAKE / OUTPUT: I/O last 3 completed shifts: In: 1402.8 [I.V.:1240.3; IV Piggyback:162.5] Out: 3190 [Urine:3180; Stool:10]  PHYSICAL EXAMINATION:. Gen:      No acute distress, frail, catchetic HEENT:  EOMI, sclera anicteric Neck:     No masses; no thyromegaly Lungs:    Coarse rhonchi; normal respiratory effort CV:         Regular rate and rhythm; no murmurs Abd:     Ostomy in place Ext:    No edema; adequate peripheral  perfusion Skin:      Warm and dry; no rash Neuro: Somnolent, arousable  LABS:  BMET Recent Labs  Lab 10/27/17 0426 10/28/17 0429 10/29/17 0318  NA 148* 150* 151*  K 4.4 4.1 4.2  CL 121* 118* 119*  CO2 20* 20* 22  BUN 33* 31* 35*  CREATININE 1.64* 1.66* 1.89*  GLUCOSE 92 84 104*    Electrolytes Recent Labs  Lab 10/25/17 0426  10/26/17 0218 10/27/17 0426 10/28/17 0429 10/29/17 0318  CALCIUM 8.1*   < > 7.7* 7.8* 8.0* 8.1*  MG 2.2  --  2.2 2.3 2.3  --   PHOS 5.3*  --   --  3.0 4.0  --    < > = values in this interval not displayed.    CBC Recent Labs  Lab 10/28/17 0429 10/29/17 0318 10/30/17 0257  WBC 16.0* 16.9* 9.6  HGB 9.2* 9.2* 10.0*  HCT 29.8* 30.9* 33.1*  PLT 142* 122* 129*    Coag's Recent Labs  Lab 10/24/17 1155 10/24/17 1939 10/25/17 0426  APTT  --  46* 47*  INR 1.46 1.93 1.72    Sepsis Markers Recent Labs  Lab 10/25/17 0426 10/25/17 1034 10/26/17 0218 10/29/17 1124 10/29/17 1130 10/30/17 0257  LATICACIDVEN 2.9* 2.5*  --   --  1.5  --   PROCALCITON 39.80  --  30.29  5.88  --  5.12    ABG Recent Labs  Lab 10/26/17 0429 10/27/17 1055 10/29/17 1127  PHART 7.378 7.389 7.320*  PCO2ART 32.5 32.5 42.1  PO2ART 75.0* 108.0 63.0*    Liver Enzymes Recent Labs  Lab 10/24/17 1155 10/24/17 1939  AST 58* 46*  ALT 26 23  ALKPHOS 110 76  BILITOT 1.2 1.8*  ALBUMIN 3.7 2.5*    Cardiac Enzymes Recent Labs  Lab 10/24/17 1939  TROPONINI 0.04*    Glucose Recent Labs  Lab 10/29/17 1132 10/29/17 1525 10/29/17 1952 10/29/17 2347 10/30/17 0339 10/30/17 0741  GLUCAP 100* 107* 133* 135* 132* 137*    Imaging Dg Chest Port 1 View  Result Date: 10/29/2017 CLINICAL DATA:  Respiratory failure.  COPD. EXAM: PORTABLE CHEST 1 VIEW COMPARISON:  10/28/2017. FINDINGS: Cardiomegaly. Pattern of diffuse pulmonary fibrosis appears more pronounced, and there may be superimposed pulmonary edema. Small BILATERAL effusions. No consolidation or  lobar atelectasis. Center venous catheter tip unchanged. Endotracheal tube has been removed. IMPRESSION: The patient has been extubated. Slight worsening aeration. Increasing interstitial prominence may represent pulmonary edema superimposed on diffuse fibrosis. Electronically Signed   By: Elsie Stain M.D.   On: 10/29/2017 11:53   STUDIES:  CXR 12/18 > Chronic fibrotic changes CXR 12/20> Mild edema, diffuse fibrosis  CULTURES: BC: 12/15 > negative  ANTIBIOTICS: Zosyn 12/15 >>  SIGNIFICANT EVENTS: 12/15- OR: s/p colectomy and colostomy   LINES/TUBES: PIV 12/15: IJ TLC ETT 12/15>>> 10/28/2017  DISCUSSION: 76 year old male with extensive medical cormorbidities presents for severe abominable pain found to have ischemic sigmoid colon s/p colectomy with ostomy 12/15  ASSESSMENT / PLAN:  PULMONARY A: VDRF secondary to acute hypoxic respiratory failure secondary to ischemic bowel Hx of severe COPD Pulmonary fibrosis, 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 Likely has idiopathic pulmonary fibrosis P:   Extubated 10/28/2017 Tenuous resp status Continue supplemental o2  CARDIOVASCULAR A:  Shock secondary to hypovolemic and possible septic shock secondary to ischemic bowel  - off neosynephrine evening of 12/18 CAD Systolic CHF s/p AICD Recent VFIB s/p AICD firing PAF  Elevated troponin secondary to demand P:  Continue amio Started on heparin gtt Hold antihypertensives Diuresis as tolerated  RENAL A:   Acute renal failure secondary to pre-renal (hypovolemic) vs renal Anion gap metabolic acidosis secondary to lactic acidosis --> resolved, but now hyperchloremic acidosis 12/19 Hyperkalemia- resolved Hypernatremia P:   Monitor urine output and Cr Stop saline Follow BMP Start free water.  GASTROINTESTINAL A:  Acute ischemic sigmoid colon s/p colectomy and ostomy 12/15 GERD P:   Continue TF's Monitor ostomy output Pain control   Protonix GI prophylaxis Management per surgery Cor  track placement  HEMATOLOGIC A:   Anemia secondary to acute blood loss  Thrombocytopenia secondary to consumption in setting of acute abdominal pathology and sepsis P:  Transfuse for Hgb < 7 SCD Follow CBC   INFECTIOUS A:   Possible septic shock from ischemic bowel Leukocytosis secondary to sepsis and reactive  P:   Continue zosyn  Pct is still high  ENDOCRINE A:   No active issue  P:   Monitor glucose on chem   NEUROLOGIC A:   Delirium post extubation P:   Extubated 10/28/2017 Wean off precedex as tolerated  GOALS OF CARE Discussed with wife, daughter yesterday and today. He has irreversible lung issues with IPF and COPD. The family has made him DNR yesterday and are willing to consider palliative care discussions and hospice. I will place a consult to palliative  care. Discussed with Dr. Lindie SpruceWyatt yesterday. He agrees with the plan.   The patient is critically ill with multiple organ system failure and requires high complexity decision making for assessment and support, frequent evaluation and titration of therapies, advanced monitoring, review of radiographic studies and interpretation of complex data.   Critical Care Time devoted to patient care services, exclusive of separately billable procedures, described in this note is 35 minutes.   Chilton GreathousePraveen Zetta Stoneman MD [facility] Pager [phone] If no answer or after 3pm call: [phone] 10/30/2017, 9:37 AM

## 2017-10-30 NOTE — Progress Notes (Signed)
ANTIBIOTIC CONSULT NOTE   Pharmacy Consult for Zosyn Indication: Intraabdominal infection  No Known Allergies  Patient Measurements: Height: 5\' 11"  (180.3 cm) Weight: 156 lb 1.4 oz (70.8 kg) IBW/kg (Calculated) : 75.3  Vital Signs: Temp: 97.6 F (36.4 C) (12/21 0340) Temp Source: Oral (12/21 0340) BP: 121/62 (12/21 0700) Pulse Rate: 52 (12/21 0700) Intake/Output from previous day: 12/20 0701 - 12/21 0700 In: 998.3 [I.V.:885.8; IV Piggyback:112.5] Out: 2220 [Urine:2220] Intake/Output from this shift: No intake/output data recorded.  Labs: Recent Labs    10/28/17 0429 10/29/17 0318 10/30/17 0257  WBC 16.0* 16.9* 9.6  HGB 9.2* 9.2* 10.0*  PLT 142* 122* 129*  CREATININE 1.66* 1.89*  --    Estimated Creatinine Clearance: 33.3 mL/min (A) (by C-G formula based on SCr of 1.89 mg/dL (H)). No results for input(s): VANCOTROUGH, VANCOPEAK, VANCORANDOM, GENTTROUGH, GENTPEAK, GENTRANDOM, TOBRATROUGH, TOBRAPEAK, TOBRARND, AMIKACINPEAK, AMIKACINTROU, AMIKACIN in the last 72 hours.   Microbiology: Recent Results (from the past 720 hour(s))  Culture, blood (Routine x 2)     Status: None   Collection Time: 10/24/17 12:03 PM  Result Value Ref Range Status   Specimen Description BLOOD BLOOD RIGHT FOREARM  Final   Special Requests AEROBIC BOTTLE ONLY Blood Culture adequate volume  Final   Culture NO GROWTH 5 DAYS  Final   Report Status 10/29/2017 FINAL  Final  Culture, blood (Routine x 2)     Status: None   Collection Time: 10/24/17 12:54 PM  Result Value Ref Range Status   Specimen Description BLOOD LEFT ANTECUBITAL  Final   Special Requests   Final    IN BOTH AEROBIC AND ANAEROBIC BOTTLES Blood Culture results may not be optimal due to an excessive volume of blood received in culture bottles   Culture NO GROWTH 5 DAYS  Final   Report Status 10/29/2017 FINAL  Final  MRSA PCR Screening     Status: None   Collection Time: 10/24/17  8:17 PM  Result Value Ref Range Status   MRSA by  PCR NEGATIVE NEGATIVE Final    Comment:        The GeneXpert MRSA Assay (FDA approved for NASAL specimens only), is one component of a comprehensive MRSA colonization surveillance program. It is not intended to diagnose MRSA infection nor to guide or monitor treatment for MRSA infections.    Assessment: 76 yom continues on zosyn. Today is D#7 of therapy. Tmax is 100.8 and WBC is WNL. Dose remains appropriate. Cultures are negative.  Goal of Therapy:  Eradication of infection  Plan:  Continue zosyn 3.375gm IV Q8H (4 hr inf) F/u renal fxn, C&S, clinical status MD - Please address planned length of therapy  Lysle Pearlachel Gethsemane Fischler, PharmD, BCPS Phone #: [phone] until 3:30pm All other times, call Main Pharmacy x [phone] 10/30/2017 7:37 AM

## 2017-10-30 NOTE — Progress Notes (Signed)
  Speech Language Pathology Treatment: Dysphagia  Patient Details Name: Stephen Cohen MRN: [id number] DOB: 11-07-1941 Today's Date: 10/30/2017 Time: [id number] SLP Time Calculation (min) (ACUTE ONLY): 13 min  Assessment / Plan / Recommendation Clinical Impression  Pt is more lethargic today and not following commands as well. He has oral holding of ice chips and falls asleep with them still in his mouth. SLP provided Max cues for alertness and sustained attention, as well as for attempts at taking small sips of water, but pt lets the straw sit in his mouth without making attempts to drink from it. Recommend to remain NPO.   HPI HPI: Pt is a 76 yo male with PMH of HTN, HLD, MI, COPD, GERD, PVD, PUD, AICD, HOH, cardiomyopathy, CAD, and PAF, who was admitted on 12/15 with ischemic sigmoid colon, now s/p hernia repair, colectomy, & colostomy on 12/15.He was intuabted 12/15-12/19.      SLP Plan  Continue with current plan of care       Recommendations  Diet recommendations: NPO Medication Administration: Via alternative means                Oral Care Recommendations: Oral care QID Follow up Recommendations: (tba) SLP Visit Diagnosis: Dysphagia, unspecified (R13.10) Plan: Continue with current plan of care       GO                Maxcine Hamaiewonsky, Laria Grimmett 10/30/2017, 8:57 AM  Maxcine HamLaura Paiewonsky, M.A. CCC-SLP [phone]

## 2017-10-30 NOTE — Progress Notes (Signed)
Patient experiencing discomfort while waiting on EMS. Orders for Morphine per Dr. Isaiah SergeMannam. Will continue to monitor for discomfort.

## 2017-10-30 NOTE — Progress Notes (Signed)
ANTICOAGULATION CONSULT NOTE   Pharmacy Consult for Heparin Indication: atrial fibrillation  No Known Allergies  Patient Measurements: Height: 5\' 11"  (180.3 cm) Weight: 156 lb 1.4 oz (70.8 kg) IBW/kg (Calculated) : 75.3 Heparin Dosing Weight: 64 kg  Vital Signs: Temp: 98.3 F (36.8 C) (12/21 1140) Temp Source: Oral (12/21 1140) BP: 112/59 (12/21 1000) Pulse Rate: 82 (12/21 1000)  Labs: Recent Labs    10/28/17 0429  10/29/17 0318  10/29/17 2000 10/30/17 0257 10/30/17 1310  HGB 9.2*  --  9.2*  --   --  10.0*  --   HCT 29.8*  --  30.9*  --   --  33.1*  --   PLT 142*  --  122*  --   --  129*  --   HEPARINUNFRC  --    < > 0.27*   < > 0.17* 0.33 0.30  CREATININE 1.66*  --  1.89*  --   --   --   --    < > = values in this interval not displayed.    Estimated Creatinine Clearance: 33.3 mL/min (A) (by C-G formula based on SCr of 1.89 mg/dL (H)).  Assessment: Patient continues on heparin for Afib. CHADS2VASC 4. On apixaban PTA (last dose 12/14) and pt w/ ischemic sigmoid colon s/p OR 12/15. Heparin level remains therapeutic at 0.3 but at the lower end of goal range.   Goal of Therapy:  Heparin level 0.3-0.7 units/ml Monitor platelets by anticoagulation protocol: Yes   Plan:  Increase heparin gtt to 1550 units/hr to keep in therapeutic range Daily heparin level and CBC  Lysle Pearlachel Marykathleen Russi, PharmD, BCPS Phone #: [phone] until 3:30pm All other times, call Main Pharmacy x [phone] 10/30/2017 2:09 PM

## 2017-10-30 NOTE — Progress Notes (Signed)
Cortrak Tube Team Note:  Consult received to place a Cortrak feeding tube.   A 10 F Cortrak tube was placed in the R nare and secured with a nasal bridle at 75 cm. Per the Cortrak monitor reading the tube tip is in the gastric antrum  No x-ray is required. RN may begin using tube.   If the tube becomes dislodged please keep the tube and contact the Cortrak team at [email or web address] ([email or web address]) for replacement.  If after hours and replacement cannot be delayed, place a NG tube and confirm placement with an abdominal x-ray.   Christophe LouisNathan Franks RD, LDN, CNSC Clinical Nutrition Pager: [phone] 10/30/2017 10:43 AM

## 2017-10-30 NOTE — Progress Notes (Signed)
2g Morphine given for pt discomfort. Hospice here to transport pt. VSS, pt denies pain at this time. Family accompanying pt on transport.

## 2017-10-30 NOTE — Progress Notes (Signed)
CSW has faxed over needed information to [facility] at this [facility]. CSW set PTAR fro 4pm and has placed all needed documents on chart at this time. CSW signing off.    Claude MangesKierra S. Goddess Gebbia, MSW, LCSW-A Emergency Department Clinical Social Worker [phone]

## 2017-10-30 NOTE — Progress Notes (Signed)
Nutrition Follow-up  DOCUMENTATION CODES:   Non-severe (moderate) malnutrition in context of chronic illness  INTERVENTION:    Vital AF 1.2 at 20 ml/h, increase by 10 ml every 4 hours to goal rate of 70 ml/h (1680 ml per day)   Provides 2016 kcal, 126 gm protein, 1362 ml free water daily  NUTRITION DIAGNOSIS:   Moderate Malnutrition related to chronic illness(COPD, CAD) as evidenced by mild fat depletion, mild muscle depletion.  Ongoing  GOAL:   Patient will meet greater than or equal to 90% of their needs  Being addressed with TF  MONITOR:   Diet advancement, I & O's, Skin  ASSESSMENT:   76 yo male with PMH of HTN, HLD, MI, COPD, GERD, PVD, PUD, AICD, HOH, cardiomyopathy, CAD, PAF who was admitted on 12/15 with ischemic sigmoid colon. S/P hernia repair, colectomy, & colostomy on 12/15.  Extubated 12/19. Cortrak tube placed today. Spoke with CCM, RD to order TF to be advanced slowly. Palliative Care consult pending. Labs reviewed. Sodium 151 (H) CBG's: 132-137 Medications reviewed and include Lasix and Solumedrol.  Diet Order:  Diet NPO time specified  EDUCATION NEEDS:   No education needs have been identified at this time  Skin:  Skin Assessment: Skin Integrity Issues: Skin Integrity Issues:: Incisions Incisions: abdomen  Last BM:  12/20, colostomy  Height:   Ht Readings from Last 1 Encounters:  10/24/17 5\' 11"  (1.803 m)    Weight:   Wt Readings from Last 1 Encounters:  10/30/17 156 lb 1.4 oz (70.8 kg)    Ideal Body Weight:  78.2 kg  BMI:  Body mass index is 21.77 kg/m.  Estimated Nutritional Needs:   Kcal:  2000-2200  Protein:  100-120 gm  Fluid:  2-2.2 L   Joaquin CourtsKimberly Harris, RD, LDN, CNSC Pager [phone] After Hours Pager [phone]

## 2017-10-30 NOTE — Progress Notes (Addendum)
12:24pm: [facility] [address] [facility] Sherron MondayHillary will speak with family at bedside.   12:22pm: CSW left VM asking that Misty StanleyLisa call CSW back about pt.   CSW spoke with pt's family at bedside and confirmed that family a well as [facility]. CSW spoke with Joni Reiningicole and was informed that the phones are down at this time at the facility and she would have admissions call CSW ASAP. CSW continues to follow for needs at this time.      Claude MangesKierra S. Smt Lokey, MSW, LCSW-A Emergency Department Clinical Social Worker [phone]

## 2017-11-10 DEATH — deceased

## 2018-01-25 ENCOUNTER — Encounter: Payer: Medicare Other | Admitting: Cardiology

## 2018-01-25 DIAGNOSIS — R0989 Other specified symptoms and signs involving the circulatory and respiratory systems: Secondary | ICD-10-CM

## 2018-01-25 NOTE — Progress Notes (Deleted)
Electrophysiology Office Note   Date:  01/25/2018   ID:  Stephen Cohen, DOB 09-20-1941, MRN [id number]  PCP:  Doreene Eland, MD  Cardiologist:  Dulce Sellar Primary Electrophysiologist:  Will Jorja Loa, MD    No chief complaint on file.    History of Present Illness: Stephen Cohen is a 77 y.o. male who is being seen today for the evaluation of CHF, VF at the request of Doreene Eland, MD. Presenting today for electrophysiology evaluation.  History of systolic heart failure with an EF of 35%, coronary disease, peripheral arterial disease, paroxysmal atrial fibrillation, hyperlipidemia, hypertension, CABG in 1999 with OM1 and RCA stents in 2014, COPD continuing to smoke, and an ICD.  Per reports, his ejection fraction has been low recently at 20%.  He has not been compliant per notes with his heart failure regimen adding salt to his diet and continuing to smoke.    He presented to the hospital yesterday after an unresponsive episode.  Device interrogation showed a ventricular fibrillation event.  He had one unsuccessful shock, followed by a successful defibrillation that returned him to sinus rhythm.  Today, denies symptoms of palpitations, chest pain, shortness of breath, orthopnea, PND, lower extremity edema, claudication, dizziness, presyncope, syncope, bleeding, or neurologic sequela. The patient is tolerating medications without difficulties. ***    Past Medical History:  Diagnosis Date  . Arthritis   . Automatic implantable cardioverter-defibrillator in situ   . CAD in native artery 01/08/2016   Overview:  Cardiac cath 03/17/16: Conclusions Diagnostic Procedure Summary Severe global LV dysfunction. EF 35%, EDP=5mm Hg. Native RCA, LAD and Cx occluded. LIMA to LAD patent. SVG to RCA clear. Jump SVG to OM1 and OM2 patent. Has proximal stent, 25% narrowing. Diagnostic Procedure Recommendations Medical Therapy because no interventional therapy is required.  . Cataracts, bilateral     . COPD (chronic obstructive pulmonary disease) (HCC)   . Essential hypertension 01/08/2016  . GERD (gastroesophageal reflux disease)   . Hiatal hernia   . HOH (hard of hearing)   . Hyperlipidemia   . Hypertension   . Ischemic dilated cardiomyopathy (HCC) 06/07/2015  . Myocardial infarction (HCC) 1995  . PAF (paroxysmal atrial fibrillation) (HCC) 01/08/2016  . Peptic ulcer disease   . Peripheral vascular disease (HCC)   . VF (ventricular fibrillation) (HCC)    a. appropriate ICD therapy 12/18   Past Surgical History:  Procedure Laterality Date  . APPENDECTOMY    . BOWEL RESECTION N/A 10/24/2017   Procedure: SMALL BOWEL RESECTION;  Surgeon: Almond Lint, MD;  Location: [facility] OR;  Service: General;  Laterality: N/A;  . CARDIAC DEFIBRILLATOR PLACEMENT  02/01/2013   [employer]  . COLONOSCOPY    . COLOSTOMY  10/24/2017   Procedure: COLOSTOMY;  Surgeon: Almond Lint, MD;  Location: [facility] OR;  Service: General;;  . CORONARY ANGIOPLASTY     2 stents in 2014.  Marland Kitchen CORONARY ARTERY BYPASS GRAFT  1995  . FEMORAL-FEMORAL BYPASS GRAFT N/A 12/12/2013   Procedure: BYPASS GRAFT FEMORAL-FEMORAL ARTERY-  RIGHT TO LEFT using rifampin soaked hemashield graft;  Surgeon: Larina Earthly, MD;  Location: [facility] OR;  Service: Vascular;  Laterality: N/A;  . FEMORAL-FEMORAL BYPASS GRAFT Bilateral 07/03/2014   Procedure: REVISION OF Right FEMORAL to Left FEMORAL ARTERY BYPASS GRAFT with removal of Eroded graft;  Surgeon: Larina Earthly, MD;  Location: [facility] OR;  Service: Vascular;  Laterality: Bilateral;  . INGUINAL HERNIA REPAIR N/A 10/24/2017   Procedure: HERNIA REPAIR INGUINAL ADULT;  Surgeon: Almond LintByerly, Faera, MD;  [facility];  Service: General;  Laterality: N/A;  . LAPAROSCOPY N/A 10/24/2017   Procedure: LAPAROSCOPY DIAGNOSTIC;  Surgeon: Almond LintByerly, Faera, MD;  Location: [facility] OR;  Service: General;  Laterality: N/A;  . LAPAROTOMY N/A 10/24/2017   Procedure: EXPLORATORY LAPAROTOMY;  Surgeon: Almond LintByerly, Faera, MD;  Location: [facility] OR;   Service: General;  Laterality: N/A;  . LEFT HEART CATHETERIZATION WITH CORONARY ANGIOGRAM N/A 11/11/2012   Procedure: LEFT HEART CATHETERIZATION WITH CORONARY ANGIOGRAM;  Surgeon: Pamella PertJagadeesh R Ganji, MD;  [facility];  Service: Cardiovascular;  Laterality: N/A;  . PERCUTANEOUS CORONARY STENT INTERVENTION (PCI-S)  11/11/2012   Procedure: PERCUTANEOUS CORONARY STENT INTERVENTION (PCI-S);  Surgeon: Pamella PertJagadeesh R Ganji, MD;  [facility];  Service: Cardiovascular;;  . PR VEIN BYPASS GRAFT,AORTO-FEM-POP  1995  2005   Right to left Fem-Fem with revision   . REMOVAL OF GRAFT Bilateral 12/12/2013   Procedure: REMOVAL OF Femoral- femoral hemashield GRAFT;  Surgeon: Larina Earthlyodd F Early, MD;  [facility];  Service: Vascular;  Laterality: Bilateral;     No current outpatient medications on file.   No current facility-administered medications for this visit.     Allergies:   Patient has no known allergies.   Social History:  The patient  reports that he has been smoking cigarettes.  He has a 65.00 pack-year smoking history. he has never used smokeless tobacco. He reports that he does not drink alcohol or use drugs.   Family History:  The patient's family history includes Cancer in his sister; Heart attack in his mother and sister; Heart disease in his father, mother, and sister; Hyperlipidemia in his father, mother, and sister; Peripheral vascular disease in his sister.    ROS:  Please see the history of present illness.   Otherwise, review of systems is positive for ***.   All other systems are reviewed and negative.   PHYSICAL EXAM: VS:  There were no vitals taken for this visit. , BMI There is no height or weight on file to calculate BMI. GEN: Well nourished, well developed, in no acute distress  HEENT: normal  Neck: no JVD, carotid bruits, or masses Cardiac: ***RRR; no murmurs, rubs, or gallops,no edema  Respiratory:  clear to auscultation bilaterally, normal work of breathing GI:  soft, nontender, nondistended, + BS MS: no deformity or atrophy  Skin: warm and dry, ***device site well healed Neuro:  Strength and sensation are intact Psych: euthymic mood, full affect  EKG:  EKG {ACTION; IS/IS [id number]} ordered today. Personal review of the ekg ordered *** shows ***  ***Personal review of the device interrogation today. Results in [facility]    Recent Labs: 10/24/2017: ALT 23 10/25/2017: B Natriuretic Peptide 1,502.2 10/28/2017: Magnesium 2.3 10/29/2017: BUN 35; Creatinine, Ser 1.89; Potassium 4.2; Sodium 151 10/30/2017: Hemoglobin 10.0; Platelets 129    Lipid Panel     Component Value Date/Time   CHOL 112 11/12/2012 0435   TRIG 153 (H) 10/27/2017 1936   HDL 25 (L) 11/12/2012 0435   CHOLHDL 4.5 11/12/2012 0435   VLDL 23 11/12/2012 0435   LDLCALC 64 11/12/2012 0435     Wt Readings from Last 3 Encounters:  10/30/17 156 lb 1.4 oz (70.8 kg)  10/23/17 141 lb (64 kg)  10/22/17 141 lb (64 kg)      Other studies Reviewed: Additional studies/ records that were reviewed today include: 2017 [facility]  Review of the above records today demonstrates:  LMCA: Normal appearance with 0% stenosis. LAD:  Chronic occlusion. Lesion on Mid LAD: Mid subsection.100% stenosis. Lesion on 1st Diag: Mid subsection.50% stenosis 5 mm length . LCx: Chronic occlusion. Lesion on Prox CX: Mid subsection.100% stenosis. RCA: Chronic occlusion. Lesion on Prox RCA: Proximal subsection.100% stenosis. Graft Lesions Lesion on Aorta Left to 1st Ob Marg (complex): Proximal body.25% stenosis 5 mm length . Cardiac Grafts  -There is a LIMA graft that originates at the LIMA and attaches to the Mid LAD.  -There is a Vein graft that originates at the Aorta Right and attaches to the Dist RCA.  -There is a Vein graft that originates at the Aorta Left and attaches to the 1st Ob Marg.The graft from 1st Ob Marg jumps to 2nd Ob Marg.   ASSESSMENT AND PLAN:  1.   Systolic heart failure due to ischemic cardiomyopathy: St. Jude dual-chamber ICD in place.  Did get shocked yesterday for ventricular tachycardia which degenerated his rhythm into ventricular fibrillation.  He had syncope and thus had a defibrillation which returned him to sinus rhythm.  He is currently asymptomatic today and feeling well.  Due to his episodes of tachycardia, we will plan to start him on amiodarone.***  2.  Coronary artery disease status post CABG and PCI with chronic stable angina: Had chest pain earlier this week which was relieved with nitroglycerin.  He is on 30 mg of M door.  Will increase to 60 mg.***  3.  Paroxysmal atrial fibrillation: On eliquis. Currently in sinus rhythm.  No changes***  This patients CHA2DS2-VASc Score and unadjusted Ischemic Stroke Rate (% per year) is equal to 4.8 % stroke rate/year from a score of 4  Above score calculated as 1 point each if present [CHF, HTN, DM, Vascular=MI/PAD/Aortic Plaque, Age if 65-74, or Male] Above score calculated as 2 points each if present [Age > 75, or Stroke/TIA/TE]  4.  VT/VF: Had syncope with abnormal rhythms.  Also had defibrillation x2.  Adjustments made to his device for further therapy with a monitor zone at 150 bpm.  We will plan to start him on amiodarone today.  I did discuss with him no driving per [employer] law for 6 months.***  Current medicines are reviewed at length with the patient today.   The patient does not have concerns regarding his medicines.  The following changes were made today: ***  Labs/ tests ordered today include:  No orders of the defined types were placed in this encounter.    Disposition:   FU with Will Camnitz *** months  Signed, Will Jorja Loa, MD  01/25/2018 7:11 AM     [facility] [address] [place] [address] [phone] (office) [phone] (fax)

## 2018-03-15 IMAGING — CR DG CHEST 2V
2 series · 2 of 2 positions shown · non-contrast
Comparison: 10/02/2017, 09/24/2017 and earlier, including CTA chest
abdomen and pelvis 09/24/2017 and CTA chest 03/12/2016.

CLINICAL DATA: Syncopal episode earlier tonight, patient
unresponsive for several seconds. Indwelling pacing defibrillator
due to ischemic cardiomyopathy. Patient states that the
defibrillator fired this evening.

EXAM:
CHEST  2 VIEW

[chest lat]
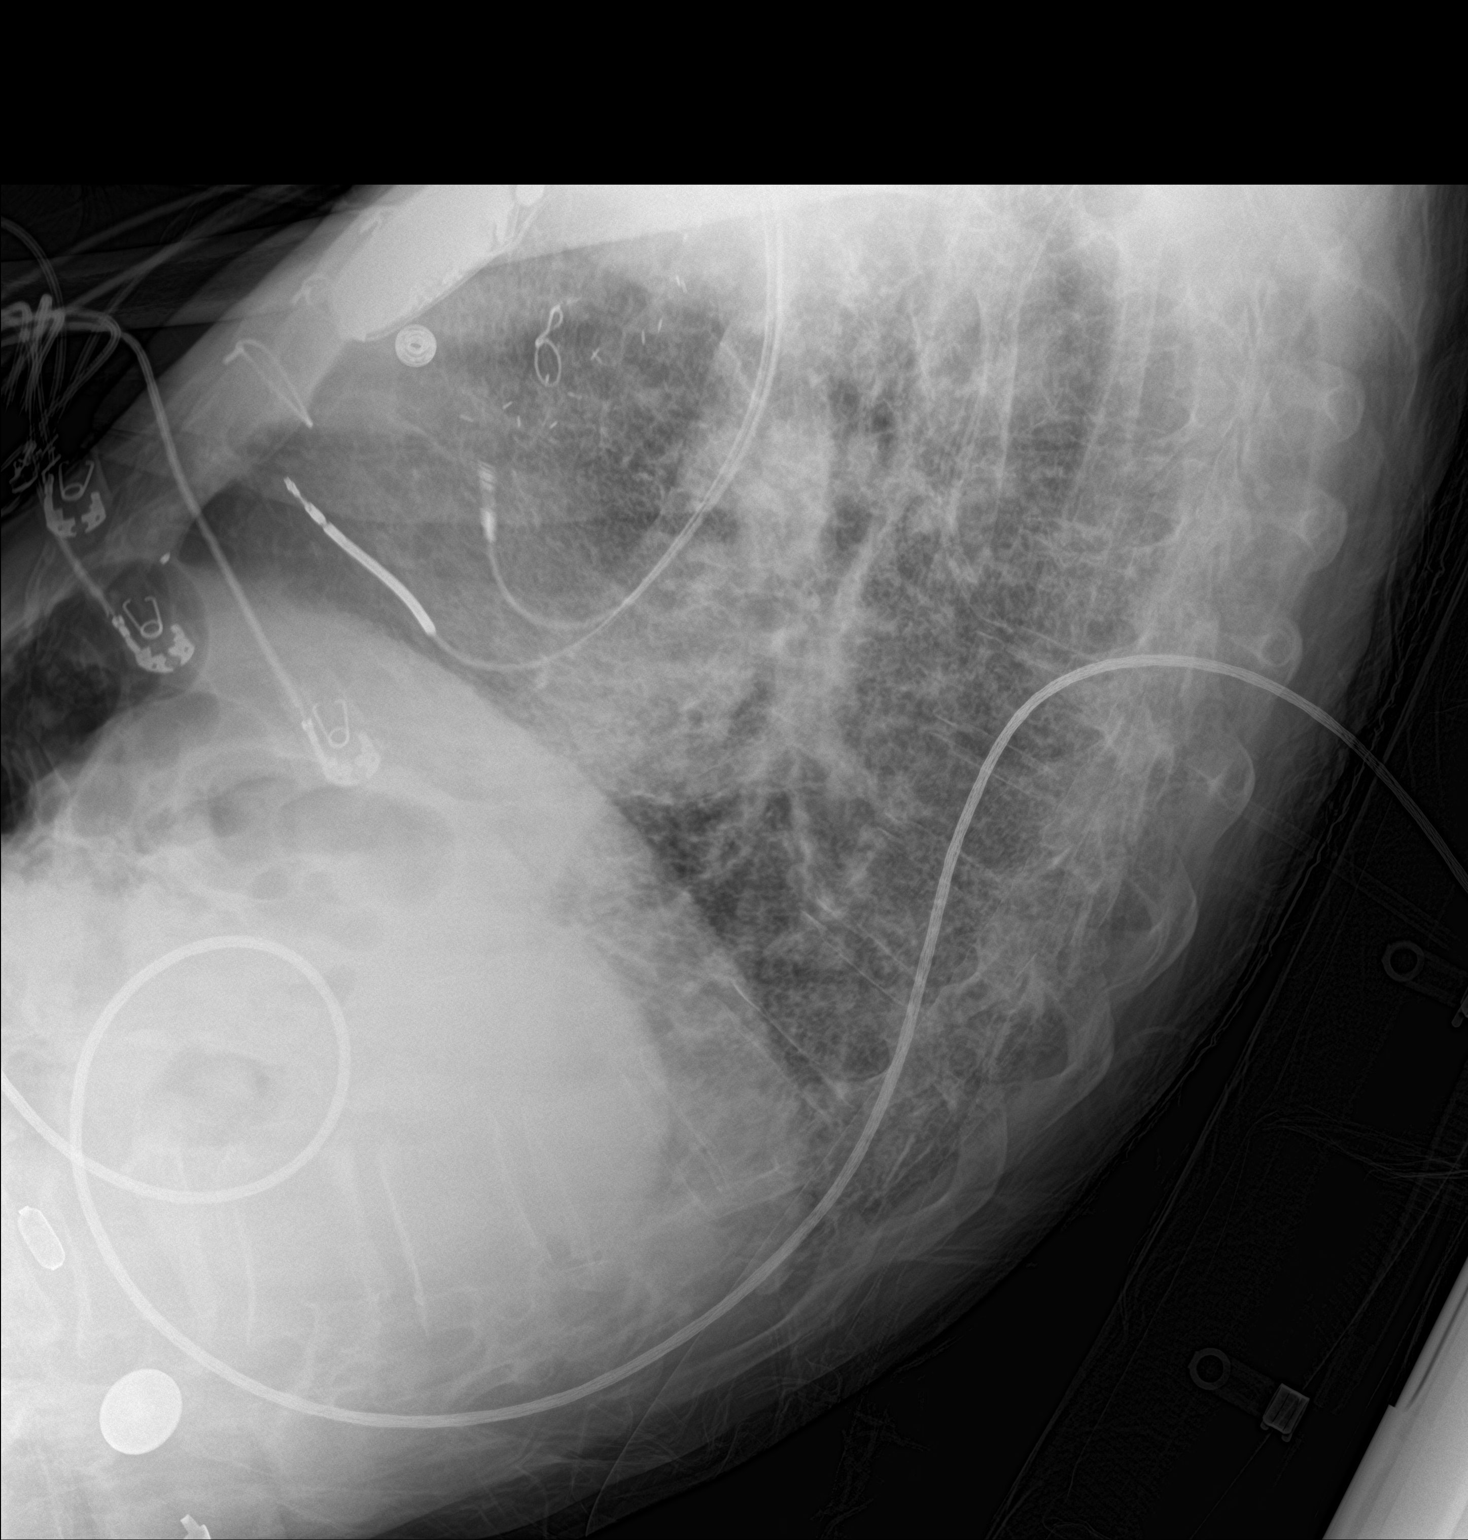

[chest ap]
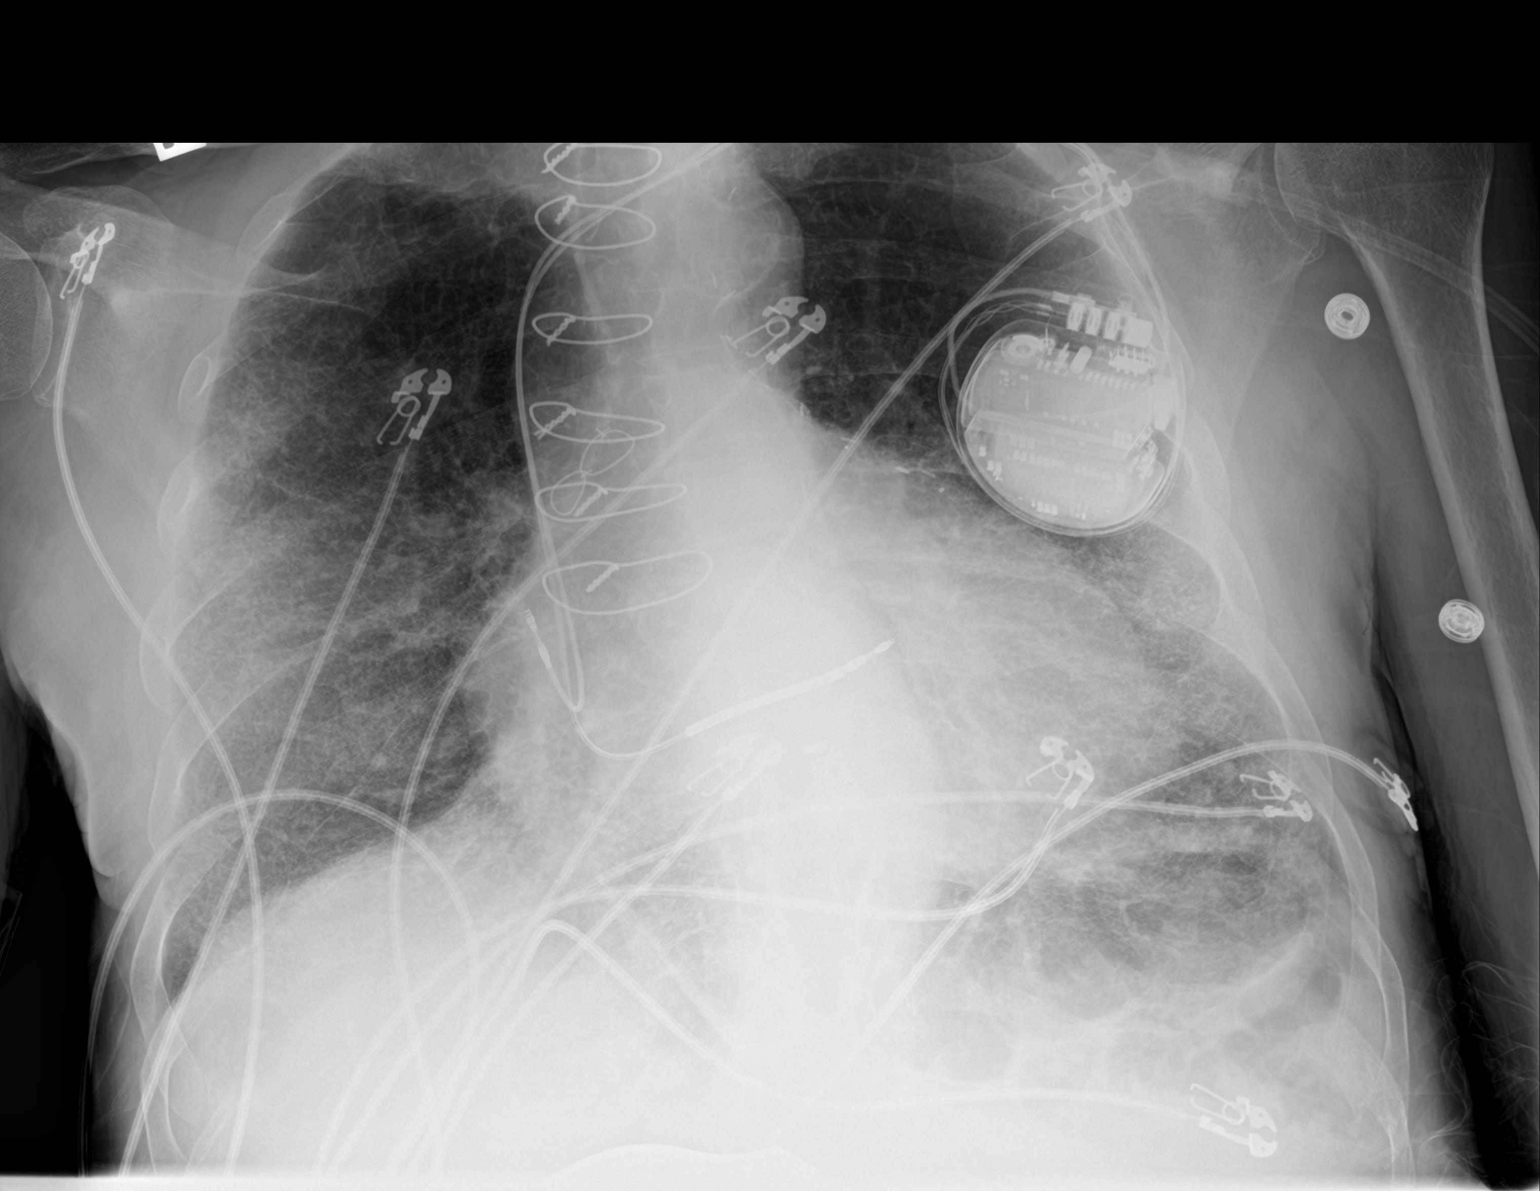

[2 of 2 positions shown; findings below may reference images not displayed]

FINDINGS: Prior sternotomy for CABG. Cardiac silhouette moderately enlarged
for the AP technique, unchanged. Thoracic aorta atherosclerotic,
unchanged. Left subclavian pacing defibrillator unchanged and
appears intact.

Extensive interstitial pulmonary fibrosis throughout both lungs and
emphysematous changes in the upper lobes, unchanged. No new
pulmonary parenchymal abnormalities. No pleural effusions.
Visualized bony thorax intact
IMPRESSION: 1.  No acute cardiopulmonary disease.
2. Stable moderate cardiomegaly without evidence of pulmonary edema.
3. Stable chronic interstitial pulmonary fibrosis.
4. Aortic Atherosclerosis ([id number]) and Emphysema ([id number].G).

## 2018-03-19 IMAGING — DX DG CHEST 1V PORT
1 series · 1 of 1 positions shown · non-contrast
Comparison: Portable chest x-ray October 24, 2017

CLINICAL DATA: Respiratory failure.  Septic shock

EXAM:
PORTABLE CHEST 1 VIEW

[chest]
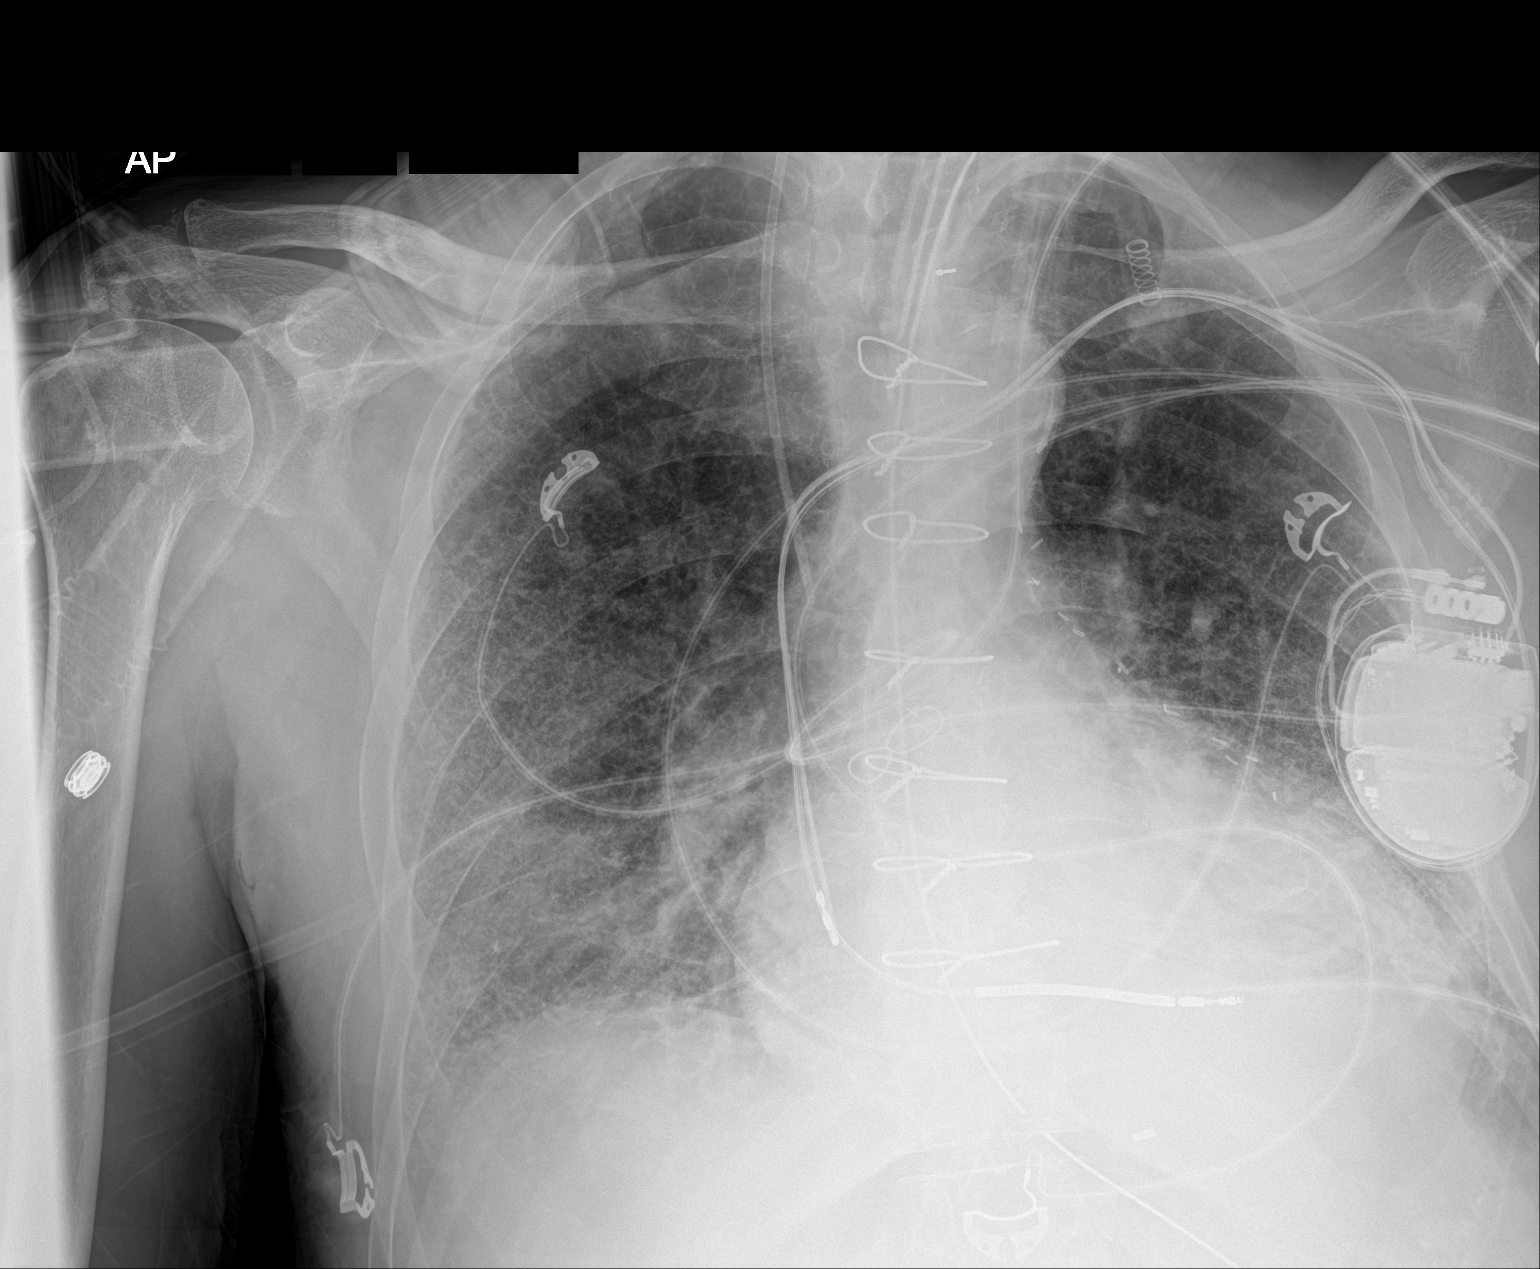

[1 of 1 positions shown; findings below may reference images not displayed]

FINDINGS: The lungs are reasonably well expanded. There are persistent
interstitial infiltrates bilaterally. The cardiac silhouette is
mildly enlarged. The pulmonary vascularity is not clearly engorged.
There is calcification in the wall of the aortic arch. There are
post CABG changes. The ICD is in stable position. The endotracheal
tube tip lies 5.3 cm above the carina. The esophagogastric tube tip
projects below the inferior margin of the image with the proximal
port at the GE junction. The right internal jugular venous catheter
tip projects over the midportion of the SVC.
IMPRESSION: Fairly stable appearance of the chest with bilateral pulmonary
interstitial edema or less likely pneumonia. The support tubes are
in reasonable position although advancement of the esophagogastric
tube by 5-10 cm would assure that the proximal port is well below
the GE junction.
